# Patient Record
Sex: Female | Born: 1945
Health system: Southern US, Community
[De-identification: ages and names within clinical notes are randomized; demographics above are authoritative.]

## PROBLEM LIST (undated history)

## (undated) DIAGNOSIS — G4700 Insomnia, unspecified: Secondary | ICD-10-CM

## (undated) DIAGNOSIS — K219 Gastro-esophageal reflux disease without esophagitis: Secondary | ICD-10-CM

## (undated) DIAGNOSIS — R490 Dysphonia: Secondary | ICD-10-CM

## (undated) DIAGNOSIS — G8929 Other chronic pain: Secondary | ICD-10-CM

## (undated) DIAGNOSIS — E039 Hypothyroidism, unspecified: Secondary | ICD-10-CM

## (undated) DIAGNOSIS — I10 Essential (primary) hypertension: Secondary | ICD-10-CM

## (undated) DIAGNOSIS — M542 Cervicalgia: Secondary | ICD-10-CM

## (undated) HISTORY — PX: WISDOM TOOTH EXTRACTION: SHX21

## (undated) HISTORY — DX: Gastro-esophageal reflux disease without esophagitis: K21.9

## (undated) HISTORY — PX: OTHER SURGICAL HISTORY: SHX169

## (undated) HISTORY — PX: EYE SURGERY: SHX253

## (undated) HISTORY — PX: VESICOVAGINAL FISTULA CLOSURE W/ TAH: SUR271

## (undated) HISTORY — DX: Hypothyroidism, unspecified: E03.9

## (undated) HISTORY — DX: Cervicalgia: M54.2

## (undated) HISTORY — DX: Insomnia, unspecified: G47.00

## (undated) HISTORY — DX: Dysphonia: R49.0

## (undated) HISTORY — DX: Other chronic pain: G89.29

---

## 2003-05-22 ENCOUNTER — Encounter: Payer: Self-pay | Admitting: Internal Medicine

## 2003-05-22 ENCOUNTER — Ambulatory Visit (HOSPITAL_COMMUNITY): Admission: RE | Admit: 2003-05-22 | Discharge: 2003-05-22 | Payer: Self-pay | Admitting: Internal Medicine

## 2003-11-07 ENCOUNTER — Ambulatory Visit (HOSPITAL_COMMUNITY): Admission: RE | Admit: 2003-11-07 | Discharge: 2003-11-07 | Payer: Self-pay | Admitting: Internal Medicine

## 2005-03-18 ENCOUNTER — Ambulatory Visit: Payer: Self-pay | Admitting: Pulmonary Disease

## 2005-04-24 ENCOUNTER — Ambulatory Visit: Payer: Self-pay | Admitting: Pulmonary Disease

## 2005-05-25 ENCOUNTER — Ambulatory Visit: Payer: Self-pay | Admitting: Pulmonary Disease

## 2005-06-05 ENCOUNTER — Ambulatory Visit: Payer: Self-pay | Admitting: Gastroenterology

## 2005-06-23 ENCOUNTER — Ambulatory Visit: Payer: Self-pay | Admitting: Gastroenterology

## 2005-06-26 ENCOUNTER — Ambulatory Visit: Payer: Self-pay | Admitting: Pulmonary Disease

## 2005-09-08 ENCOUNTER — Ambulatory Visit: Payer: Self-pay | Admitting: Gastroenterology

## 2005-10-02 ENCOUNTER — Ambulatory Visit: Payer: Self-pay | Admitting: Pulmonary Disease

## 2005-10-27 ENCOUNTER — Ambulatory Visit: Payer: Self-pay | Admitting: Gastroenterology

## 2006-01-28 ENCOUNTER — Ambulatory Visit: Payer: Self-pay | Admitting: Gastroenterology

## 2007-02-01 ENCOUNTER — Ambulatory Visit: Payer: Self-pay | Admitting: Family Medicine

## 2007-02-18 ENCOUNTER — Ambulatory Visit: Payer: Self-pay | Admitting: Family Medicine

## 2007-02-18 LAB — CONVERTED CEMR LAB
ALT: 25 units/L (ref 0–40)
Alkaline Phosphatase: 83 units/L (ref 39–117)
Basophils Relative: 1.6 % — ABNORMAL HIGH (ref 0.0–1.0)
Bilirubin, Direct: 0.1 mg/dL (ref 0.0–0.3)
CO2: 30 meq/L (ref 19–32)
Creatinine, Ser: 0.8 mg/dL (ref 0.4–1.2)
Eosinophils Relative: 2.3 % (ref 0.0–5.0)
GFR calc Af Amer: 95 mL/min
Glucose, Bld: 96 mg/dL (ref 70–99)
HCT: 37.5 % (ref 36.0–46.0)
Hemoglobin: 12.8 g/dL (ref 12.0–15.0)
Lymphocytes Relative: 30 % (ref 12.0–46.0)
Monocytes Absolute: 0.5 10*3/uL (ref 0.2–0.7)
Neutrophils Relative %: 59.4 % (ref 43.0–77.0)
Potassium: 4.3 meq/L (ref 3.5–5.1)
RDW: 14.1 % (ref 11.5–14.6)
TSH: 12.61 microintl units/mL — ABNORMAL HIGH (ref 0.35–5.50)
Total Bilirubin: 0.5 mg/dL (ref 0.3–1.2)
Total CHOL/HDL Ratio: 4
Total Protein: 7.2 g/dL (ref 6.0–8.3)
VLDL: 19 mg/dL (ref 0–40)

## 2007-02-25 ENCOUNTER — Ambulatory Visit: Payer: Self-pay | Admitting: Family Medicine

## 2007-05-05 DIAGNOSIS — K219 Gastro-esophageal reflux disease without esophagitis: Secondary | ICD-10-CM

## 2007-05-05 DIAGNOSIS — J309 Allergic rhinitis, unspecified: Secondary | ICD-10-CM | POA: Insufficient documentation

## 2007-05-05 DIAGNOSIS — J45909 Unspecified asthma, uncomplicated: Secondary | ICD-10-CM

## 2007-05-10 ENCOUNTER — Ambulatory Visit: Payer: Self-pay | Admitting: Gastroenterology

## 2007-05-12 ENCOUNTER — Encounter: Payer: Self-pay | Admitting: Family Medicine

## 2007-05-24 ENCOUNTER — Ambulatory Visit: Payer: Self-pay | Admitting: Gastroenterology

## 2007-05-24 ENCOUNTER — Encounter: Payer: Self-pay | Admitting: Family Medicine

## 2007-05-24 HISTORY — PX: COLONOSCOPY: SHX174

## 2007-06-03 ENCOUNTER — Ambulatory Visit: Payer: Self-pay | Admitting: Family Medicine

## 2007-06-03 DIAGNOSIS — E039 Hypothyroidism, unspecified: Secondary | ICD-10-CM | POA: Insufficient documentation

## 2007-06-03 DIAGNOSIS — I10 Essential (primary) hypertension: Secondary | ICD-10-CM

## 2007-06-03 DIAGNOSIS — M542 Cervicalgia: Secondary | ICD-10-CM | POA: Insufficient documentation

## 2007-06-03 DIAGNOSIS — Z9189 Other specified personal risk factors, not elsewhere classified: Secondary | ICD-10-CM

## 2007-06-07 LAB — CONVERTED CEMR LAB: TSH: 2.17 microintl units/mL (ref 0.35–5.50)

## 2007-08-19 ENCOUNTER — Encounter: Payer: Self-pay | Admitting: Pulmonary Disease

## 2007-08-24 ENCOUNTER — Ambulatory Visit: Payer: Self-pay | Admitting: Family Medicine

## 2007-08-29 LAB — CONVERTED CEMR LAB
Chloride: 105 meq/L (ref 96–112)
Creatinine, Ser: 1 mg/dL (ref 0.4–1.2)
Glucose, Bld: 87 mg/dL (ref 70–99)
Potassium: 4 meq/L (ref 3.5–5.1)
Sodium: 142 meq/L (ref 135–145)
TSH: 3.37 microintl units/mL (ref 0.35–5.50)

## 2007-08-31 ENCOUNTER — Ambulatory Visit: Payer: Self-pay | Admitting: Gastroenterology

## 2007-09-07 ENCOUNTER — Telehealth: Payer: Self-pay | Admitting: Family Medicine

## 2007-09-20 ENCOUNTER — Telehealth: Payer: Self-pay | Admitting: Family Medicine

## 2008-01-16 ENCOUNTER — Ambulatory Visit: Payer: Self-pay | Admitting: Family Medicine

## 2008-01-16 DIAGNOSIS — I803 Phlebitis and thrombophlebitis of lower extremities, unspecified: Secondary | ICD-10-CM

## 2008-01-17 ENCOUNTER — Encounter: Payer: Self-pay | Admitting: Family Medicine

## 2008-01-17 ENCOUNTER — Ambulatory Visit: Payer: Self-pay

## 2008-02-17 ENCOUNTER — Telehealth: Payer: Self-pay | Admitting: Family Medicine

## 2009-11-28 ENCOUNTER — Ambulatory Visit: Payer: Self-pay | Admitting: Family Medicine

## 2009-12-09 ENCOUNTER — Telehealth: Payer: Self-pay | Admitting: Family Medicine

## 2009-12-18 ENCOUNTER — Telehealth: Payer: Self-pay | Admitting: Family Medicine

## 2009-12-19 ENCOUNTER — Encounter: Payer: Self-pay | Admitting: Family Medicine

## 2010-01-01 ENCOUNTER — Ambulatory Visit: Payer: Self-pay | Admitting: Family Medicine

## 2010-10-05 LAB — CONVERTED CEMR LAB
Basophils Absolute: 0.1 10*3/uL (ref 0.0–0.1)
Eosinophils Absolute: 0.3 10*3/uL (ref 0.0–0.7)
HCT: 39.9 % (ref 36.0–46.0)
Hemoglobin: 13 g/dL (ref 12.0–15.0)
Hgb A1c MFr Bld: 5.8 % (ref 4.6–6.5)
Lymphs Abs: 2.1 10*3/uL (ref 0.7–4.0)
MCHC: 32.6 g/dL (ref 30.0–36.0)
Monocytes Absolute: 0.6 10*3/uL (ref 0.1–1.0)
Neutro Abs: 5.2 10*3/uL (ref 1.4–7.7)
Platelets: 314 10*3/uL (ref 150.0–400.0)
RDW: 13.2 % (ref 11.5–14.6)

## 2010-10-07 NOTE — Progress Notes (Signed)
Summary: PA call  Phone Note From Pharmacy   Caller: cheryl smith,[facility] Summary of Call: Calling to followup on PA fax.  [phone] option #1.  Ref # [id number].  for Lunesta & Achiphex.  Please call back with response.  Long message with too many specifics to include.     They will refax the info to Dr. Clent Ridges.  Rudy Jew, RN  December 09, 2009 5:10 PM  Initial call taken by: Rudy Jew, RN,  December 09, 2009 1:07 PM

## 2010-10-07 NOTE — Medication Information (Signed)
Summary: Coverage Approval for Lunesta  Coverage Approval for Lunesta   Imported By: Maryln Gottron 12/23/2009 15:32:42  _____________________________________________________________________  External Attachment:    Type:   Image     Comment:   External Document

## 2010-10-07 NOTE — Assessment & Plan Note (Signed)
Summary: renew meds//ccm   Vital Signs:  Patient profile:   65 year old female Weight:      212 pounds Temp:     98.2 degrees F oral BP sitting:   146 / 104  (left arm) Cuff size:   regular  Vitals Entered By: Raechel Ache, RN (November 28, 2009 11:13 AM) CC: ROV- needs refills and BP is up.   History of Present Illness: Here for med refills and to ask if she can try Lunesta for sleep. She had some fasting labs drawn at her job this am, and she brings them with her. She has never had elevated glucoses before, but she does have a strong family hx of diabetes. Her fasting glucose was 136. her TSH should be available tomorrow.   Allergies: 1)  ! Morphine Sulfate Cr (Morphine Sulfate) 2)  ! Pneumovax 23 (Pneumococcal Vac Polyvalent)  Past History:  Past Medical History: Reviewed history from 08/19/2007 and no changes required. NECK PAIN, CHRONIC (ICD-723.1) HYPOTHYROIDISM (ICD-244.9) HYPERTENSION (ICD-401.9) INSOMNIA, HX OF (ICD-V15.89) GERD (ICD-530.81) ASTHMA (ICD-493.90) ALLERGIC RHINITIS (ICD-477.9)  Past Surgical History: Reviewed history from 06/03/2007 and no changes required. Caesarean section x2 Hysterectomy lt foot surgery Eye surgery Wisdom teeth removed  Review of Systems  The patient denies anorexia, fever, weight loss, weight gain, vision loss, decreased hearing, hoarseness, chest pain, syncope, dyspnea on exertion, peripheral edema, prolonged cough, headaches, hemoptysis, abdominal pain, melena, hematochezia, severe indigestion/heartburn, hematuria, incontinence, genital sores, muscle weakness, suspicious skin lesions, transient blindness, difficulty walking, depression, unusual weight change, abnormal bleeding, enlarged lymph nodes, angioedema, breast masses, and testicular masses.    Physical Exam  General:  Well-developed,well-nourished,in no acute distress; alert,appropriate and cooperative throughout examination Neck:  No deformities, masses, or  tenderness noted. Lungs:  Normal respiratory effort, chest expands symmetrically. Lungs are clear to auscultation, no crackles or wheezes. Heart:  Normal rate and regular rhythm. S1 and S2 normal without gallop, murmur, click, rub or other extra sounds.   Impression & Recommendations:  Problem # 1:  DIABETES MELLITUS, TYPE II (ICD-250.00)  Orders: Venipuncture (16109) TLB-CBC Platelet - w/Differential (85025-CBCD) TLB-A1C / Hgb A1C (Glycohemoglobin) (83036-A1C) TLB-Microalbumin/Creat Ratio, Urine (82043-MALB)  Her updated medication list for this problem includes:    Lisinopril 10 Mg Tabs (Lisinopril) ..... Once daily  Problem # 2:  HYPOTHYROIDISM (ICD-244.9)  Her updated medication list for this problem includes:    Synthroid 100 Mcg Tabs (Levothyroxine sodium) .Marland Kitchen... 1 by mouth once daily  Problem # 3:  HYPERTENSION (ICD-401.9)  Her updated medication list for this problem includes:    Lisinopril 10 Mg Tabs (Lisinopril) ..... Once daily  Problem # 4:  INSOMNIA, HX OF (ICD-V15.89)  Complete Medication List: 1)  Aciphex 20 Mg Tbec (Rabeprazole sodium) .... Take 1 tablet by mouth two times a day 2)  Nasacort Aq 55 Mcg/act Aers (Triamcinolone acetonide(nasal)) .... 2 sprays each nostril once daily 3)  Albuterol 90 Mcg/act Aers (Albuterol) .... As needed 4)  Multivitamins Caps (Multiple vitamin) .Marland Kitchen.. 1 by mouth once daily 5)  Restasis 0.05 % Emul (Cyclosporine) .... As needed 6)  Synthroid 100 Mcg Tabs (Levothyroxine sodium) .Marland Kitchen.. 1 by mouth once daily 7)  Lunesta 3 Mg Tabs (Eszopiclone) .... At bedtime 8)  Lisinopril 10 Mg Tabs (Lisinopril) .... Once daily  Patient Instructions: 1)  She certainly now has evidence of type II diabetes, and we spent 35 minutes talking about this. We will check an A1c today to see where she stands. She needs to  adjust her diet and get more exercise. Start on Lisinopril for her HTN, and this adds renal protection as well. Try Lunesta.  2)  Please  schedule a follow-up appointment in 1 month, and we will do a cpx at that time. Prescriptions: ACIPHEX 20 MG  TBEC (RABEPRAZOLE SODIUM) Take 1 tablet by mouth two times a day  #180 x 3   Entered and Authorized by:   Nelwyn Salisbury MD   Signed by:   Nelwyn Salisbury MD on 11/28/2009   Method used:   Print then Give to Patient   RxID:   [id number] SYNTHROID 100 MCG TABS (LEVOTHYROXINE SODIUM) 1 by mouth once daily  #90 x 3   Entered and Authorized by:   Nelwyn Salisbury MD   Signed by:   Nelwyn Salisbury MD on 11/28/2009   Method used:   Print then Give to Patient   RxID:   [id number] LISINOPRIL 10 MG TABS (LISINOPRIL) once daily  #90 x 3   Entered and Authorized by:   Nelwyn Salisbury MD   Signed by:   Nelwyn Salisbury MD on 11/28/2009   Method used:   Print then Give to Patient   RxID:   [id number] LUNESTA 3 MG TABS (ESZOPICLONE) at bedtime  #90 x 1   Entered and Authorized by:   Nelwyn Salisbury MD   Signed by:   Nelwyn Salisbury MD on 11/28/2009   Method used:   Print then Give to Patient   RxID:   [id number]

## 2010-10-07 NOTE — Medication Information (Signed)
Summary: Prior Authorization Request for Lunesta  Prior Authorization Request for Lunesta   Imported By: Maryln Gottron 12/26/2009 14:24:59  _____________________________________________________________________  External Attachment:    Type:   Image     Comment:   External Document

## 2010-10-07 NOTE — Assessment & Plan Note (Signed)
Summary: cpx---will fast ok to work in per dr fry//ccm   Vital Signs:  Patient profile:   65 year old female Height:      65 inches Weight:      214 pounds BMI:     35.74 BP sitting:   122 / 90  (left arm) Cuff size:   large  Vitals Entered By: Raechel Ache, RN (January 01, 2010 8:52 AM) CC: CPX, labs done.   History of Present Illness: 65 yr old female for a cpx. She feels good in general but she admits to using her rescue inhaler every day. She had used Advair in the past, but not recently. Spring is usually the worst time of year for her.   Preventive Screening-Counseling & Management  Alcohol-Tobacco     Smoking Status: quit  Allergies: 1)  ! Morphine Sulfate Cr (Morphine Sulfate) 2)  ! Pneumovax 23 (Pneumococcal Vac Polyvalent)  Past History:  Past Medical History: Reviewed history from 08/19/2007 and no changes required. NECK PAIN, CHRONIC (ICD-723.1) HYPOTHYROIDISM (ICD-244.9) HYPERTENSION (ICD-401.9) INSOMNIA, HX OF (ICD-V15.89) GERD (ICD-530.81) ASTHMA (ICD-493.90) ALLERGIC RHINITIS (ICD-477.9)  Past Surgical History: Caesarean section x2 Hysterectomy lt foot surgery Eye surgery Wisdom teeth removed colonoscopy 05-24-07 per Dr. Russella Dar, benign polyps and diverticulosis, repeat 5 yrs  Family History: Reviewed history and no changes required. Family History of CAD Female 1st degree relative <50 Family History Diabetes 1st degree relative Family History High cholesterol Family History Hypertension Family History of Stroke M 1st degree relative <50  Social History: Reviewed history and no changes required. Divorced Former Smoker Alcohol use-yes Smoking Status:  quit  Review of Systems  The patient denies anorexia, fever, weight loss, vision loss, decreased hearing, hoarseness, chest pain, syncope, peripheral edema, prolonged cough, headaches, hemoptysis, abdominal pain, melena, hematochezia, severe indigestion/heartburn, hematuria, incontinence,  genital sores, muscle weakness, suspicious skin lesions, transient blindness, difficulty walking, depression, unusual weight change, abnormal bleeding, enlarged lymph nodes, angioedema, breast masses, and testicular masses.    Physical Exam  General:  overweight-appearing.   Head:  Normocephalic and atraumatic without obvious abnormalities. No apparent alopecia or balding. Eyes:  No corneal or conjunctival inflammation noted. EOMI. Perrla. Funduscopic exam benign, without hemorrhages, exudates or papilledema. Vision grossly normal. Ears:  External ear exam shows no significant lesions or deformities.  Otoscopic examination reveals clear canals, tympanic membranes are intact bilaterally without bulging, retraction, inflammation or discharge. Hearing is grossly normal bilaterally. Nose:  External nasal examination shows no deformity or inflammation. Nasal mucosa are pink and moist without lesions or exudates. Mouth:  Oral mucosa and oropharynx without lesions or exudates.  Teeth in good repair. Neck:  No deformities, masses, or tenderness noted. Chest Wall:  No deformities, masses, or tenderness noted. Breasts:  No mass, nodules, thickening, tenderness, bulging, retraction, inflamation, nipple discharge or skin changes noted.   Lungs:  Normal respiratory effort, chest expands symmetrically. Lungs are clear to auscultation, no crackles or wheezes. Heart:  Normal rate and regular rhythm. S1 and S2 normal without gallop, murmur, click, rub or other extra sounds. EKG normal Abdomen:  Bowel sounds positive,abdomen soft and non-tender without masses, organomegaly or hernias noted. Msk:  No deformity or scoliosis noted of thoracic or lumbar spine.   Pulses:  R and L carotid,radial,femoral,dorsalis pedis and posterior tibial pulses are full and equal bilaterally Extremities:  No clubbing, cyanosis, edema, or deformity noted with normal full range of motion of all joints.   Neurologic:  No cranial nerve  deficits noted. Station and  gait are normal. Plantar reflexes are down-going bilaterally. DTRs are symmetrical throughout. Sensory, motor and coordinative functions appear intact. Skin:  Intact without suspicious lesions or rashes Cervical Nodes:  No lymphadenopathy noted Axillary Nodes:  No palpable lymphadenopathy Inguinal Nodes:  No significant adenopathy Psych:  Cognition and judgment appear intact. Alert and cooperative with normal attention span and concentration. No apparent delusions, illusions, hallucinations   Impression & Recommendations:  Problem # 1:  HEALTH MAINTENANCE EXAM (ICD-V70.0)  Orders: EKG w/ Interpretation (93000)  Complete Medication List: 1)  Nasacort Aq 55 Mcg/act Aers (Triamcinolone acetonide(nasal)) .... 2 sprays each nostril once daily 2)  Multivitamins Caps (Multiple vitamin) .Marland Kitchen.. 1 by mouth once daily 3)  Restasis 0.05 % Emul (Cyclosporine) .... As needed 4)  Synthroid 100 Mcg Tabs (Levothyroxine sodium) .Marland Kitchen.. 1 by mouth once daily 5)  Lunesta 3 Mg Tabs (Eszopiclone) .... At bedtime 6)  Lisinopril 10 Mg Tabs (Lisinopril) .... Once daily 7)  Prilosec 40 Mg Cpdr (Omeprazole) .Marland Kitchen.. 1 two times a day 8)  Proair Hfa 108 (90 Base) Mcg/act Aers (Albuterol sulfate) .... 2 puffs q 4 hours as needed 9)  Qvar 80 Mcg/act Aers (Beclomethasone dipropionate) .... 2 puffs two times a day  Patient Instructions: 1)  She needs a maintenance inhaler, so we will start her on QVar daily.  2)  It is important that you exercise reguarly at least 20 minutes 5 times a week. If you develop chest pain, have severe difficulty breathing, or feel very tired, stop exercising immediately and seek medical attention.  3)  You need to lose weight. Consider a lower calorie diet and regular exercise.  4)  Schedule your mammogram.  Prescriptions: QVAR 80 MCG/ACT AERS (BECLOMETHASONE DIPROPIONATE) 2 puffs two times a day  #1 x 0   Entered and Authorized by:   Nelwyn Salisbury MD   Signed by:    Nelwyn Salisbury MD on 01/01/2010   Method used:   Electronically to        [facility] [address] [phone]* (retail)       [address] [place], [place]  [address]       Ph: [phone]       Fax: [phone]   RxID:   [id number] QVAR 80 MCG/ACT AERS (BECLOMETHASONE DIPROPIONATE) 2 puffs two times a day  #90 x 3   Entered and Authorized by:   Nelwyn Salisbury MD   Signed by:   Nelwyn Salisbury MD on 01/01/2010   Method used:   Print then Give to Patient   RxID:   [id number] PROAIR HFA 108 (90 BASE) MCG/ACT AERS (ALBUTEROL SULFATE) 2 puffs q 4 hours as needed  #6 x 3   Entered and Authorized by:   Nelwyn Salisbury MD   Signed by:   Nelwyn Salisbury MD on 01/01/2010   Method used:   Print then Give to Patient   RxID:   [id number]    Preventive Care Screening  Colonoscopy:    Date:  09/07/2006    Results:  normal

## 2010-10-07 NOTE — Progress Notes (Signed)
Summary: Lunesta authorization process  Phone Note Call from Patient Call back at [facility] Phone [phone]   Caller: Patient Summary of Call: Pt called re: status of PA for Lunesta. Pt can not take Ambien because of migraines. Please check with [facility]. Pt has been trying to get this taken care of since 12/02/09.   Called patient with info from [facility]'s 4-4 call to office.  She will call [facility] & check.  Rudy Jew, RN  December 18, 2009 12:51 PM    Initial call taken by: Lucy Antigua,  December 18, 2009 12:37 PM  Follow-up for Phone Call        Patient did call [facility].  The Aciphex has been authorized, but not the [place].  [facility] says we need to call to authorize.  Done.  Case # [id number] for drug coverage Lunesta.  Case # [id number] for Lunesta quantity.  Will get 2 faxes & Dr. needs to fill out both.  If approval received, will get fax notifying into office. Office/doctor  will then need to call back with prescription.   Follow-up by: Rudy Jew, RN,  December 19, 2009 9:36 AM  Additional Follow-up for Phone Call Additional follow up Details #1::        agreed Additional Follow-up by: Nelwyn Salisbury MD,  December 19, 2009 11:51 AM

## 2010-10-13 ENCOUNTER — Telehealth: Payer: Self-pay

## 2010-10-13 DIAGNOSIS — K219 Gastro-esophageal reflux disease without esophagitis: Secondary | ICD-10-CM

## 2010-10-13 DIAGNOSIS — E039 Hypothyroidism, unspecified: Secondary | ICD-10-CM

## 2010-10-13 MED ORDER — ESOMEPRAZOLE MAGNESIUM 40 MG PO CPDR: 40.0000 mg | DELAYED_RELEASE_CAPSULE | Freq: Every day | ORAL | Status: DC

## 2010-10-13 MED ORDER — LEVOTHYROXINE SODIUM 100 MCG PO TABS: 100.0000 ug | ORAL_TABLET | Freq: Every day | ORAL | Status: DC

## 2010-10-13 NOTE — Telephone Encounter (Signed)
Pt aware.

## 2010-10-24 ENCOUNTER — Other Ambulatory Visit: Payer: Self-pay | Admitting: Family Medicine

## 2010-12-26 ENCOUNTER — Encounter: Payer: Self-pay | Admitting: Family Medicine

## 2010-12-29 ENCOUNTER — Other Ambulatory Visit (INDEPENDENT_AMBULATORY_CARE_PROVIDER_SITE_OTHER): Payer: BC Managed Care – PPO | Admitting: Family Medicine

## 2010-12-29 DIAGNOSIS — E785 Hyperlipidemia, unspecified: Secondary | ICD-10-CM

## 2010-12-29 DIAGNOSIS — Z0000 Encounter for general adult medical examination without abnormal findings: Secondary | ICD-10-CM

## 2010-12-29 LAB — POCT URINALYSIS DIPSTICK
Spec Grav, UA: 1.025
pH, UA: 5

## 2010-12-29 LAB — CBC WITH DIFFERENTIAL/PLATELET
Basophils Absolute: 0 10*3/uL (ref 0.0–0.1)
Basophils Relative: 0.4 % (ref 0.0–3.0)
Eosinophils Absolute: 0.2 10*3/uL (ref 0.0–0.7)
MCHC: 33.4 g/dL (ref 30.0–36.0)
MCV: 84.3 fl (ref 78.0–100.0)
Monocytes Absolute: 0.7 10*3/uL (ref 0.1–1.0)
Neutrophils Relative %: 69.2 % (ref 43.0–77.0)
Platelets: 361 10*3/uL (ref 150.0–400.0)
RBC: 4.82 Mil/uL (ref 3.87–5.11)
RDW: 15.8 % — ABNORMAL HIGH (ref 11.5–14.6)

## 2010-12-29 LAB — LIPID PANEL
HDL: 67 mg/dL (ref >39.00)
Triglycerides: 141 mg/dL (ref 0.0–149.0)
VLDL: 28.2 mg/dL (ref 0.0–40.0)

## 2010-12-29 LAB — BASIC METABOLIC PANEL
BUN: 15 mg/dL (ref 6–23)
CO2: 26 mEq/L (ref 19–32)
Calcium: 9.3 mg/dL (ref 8.4–10.5)
Chloride: 101 mEq/L (ref 96–112)
Creatinine, Ser: 1 mg/dL (ref 0.4–1.2)
Glucose, Bld: 93 mg/dL (ref 70–99)

## 2010-12-29 LAB — HEPATIC FUNCTION PANEL
Albumin: 4 g/dL (ref 3.5–5.2)
Bilirubin, Direct: 0 mg/dL (ref 0.0–0.3)
Total Protein: 7.1 g/dL (ref 6.0–8.3)

## 2010-12-29 LAB — TSH: TSH: 1.38 u[IU]/mL (ref 0.35–5.50)

## 2010-12-30 NOTE — Progress Notes (Signed)
Pt informed

## 2011-01-05 ENCOUNTER — Encounter: Payer: Self-pay | Admitting: Family Medicine

## 2011-01-05 ENCOUNTER — Ambulatory Visit (INDEPENDENT_AMBULATORY_CARE_PROVIDER_SITE_OTHER): Payer: BC Managed Care – PPO | Admitting: Family Medicine

## 2011-01-05 VITALS — BP 124/88 | HR 109 | Temp 98.8°F | Ht 64.0 in | Wt 206.0 lb

## 2011-01-05 DIAGNOSIS — I10 Essential (primary) hypertension: Secondary | ICD-10-CM

## 2011-01-05 DIAGNOSIS — K219 Gastro-esophageal reflux disease without esophagitis: Secondary | ICD-10-CM

## 2011-01-05 DIAGNOSIS — Z0000 Encounter for general adult medical examination without abnormal findings: Secondary | ICD-10-CM

## 2011-01-05 MED ORDER — TRIAMCINOLONE ACETONIDE(NASAL) 55 MCG/ACT NA INHA: 2.0000 | Freq: Every day | NASAL | Status: DC

## 2011-01-05 MED ORDER — TEMAZEPAM 30 MG PO CAPS: 30.0000 mg | ORAL_CAPSULE | Freq: Every evening | ORAL | Status: DC | PRN

## 2011-01-05 MED ORDER — ALBUTEROL SULFATE HFA 108 (90 BASE) MCG/ACT IN AERS: 2.0000 | INHALATION_SPRAY | RESPIRATORY_TRACT | Status: DC | PRN

## 2011-01-05 MED ORDER — BECLOMETHASONE DIPROPIONATE 80 MCG/ACT IN AERS: 2.0000 | INHALATION_SPRAY | Freq: Two times a day (BID) | RESPIRATORY_TRACT | Status: DC

## 2011-01-05 MED ORDER — LISINOPRIL 10 MG PO TABS: 10.0000 mg | ORAL_TABLET | Freq: Every day | ORAL | Status: DC

## 2011-01-05 MED ORDER — ESOMEPRAZOLE MAGNESIUM 40 MG PO CPDR: 40.0000 mg | DELAYED_RELEASE_CAPSULE | Freq: Two times a day (BID) | ORAL | Status: DC

## 2011-01-05 MED ORDER — LEVOTHYROXINE SODIUM 100 MCG PO TABS: 100.0000 ug | ORAL_TABLET | Freq: Every day | ORAL | Status: DC

## 2011-01-05 MED ORDER — AZELASTINE HCL 0.15 % NA SOLN: 2.0000 | Freq: Two times a day (BID) | NASAL | Status: DC

## 2011-01-05 NOTE — Progress Notes (Signed)
  Subjective:    Patient ID: Whitney Evans, female    DOB: [birth year], 65 y.o.   MRN: [id number]  HPI 65 yr old female for a cpx. She feels well in general. She has been seeing an ENT in [place] for chronic hoarseness, and a recent endoscopy revealed a lot of upper airway inflammation, presumably from reflux. She is on bid PPI therapy now.    Review of Systems  Constitutional: Negative.   HENT: Negative.   Eyes: Negative.   Respiratory: Negative.   Cardiovascular: Negative.   Gastrointestinal: Negative.   Genitourinary: Negative for dysuria, urgency, frequency, hematuria, flank pain, decreased urine volume, enuresis, difficulty urinating, pelvic pain and dyspareunia.  Musculoskeletal: Negative.   Skin: Negative.   Neurological: Negative.   Hematological: Negative.   Psychiatric/Behavioral: Negative.        Objective:   Physical Exam  Constitutional: She is oriented to person, place, and time. She appears well-developed and well-nourished. No distress.  HENT:  Head: Normocephalic and atraumatic.  Right Ear: External ear normal.  Left Ear: External ear normal.  Nose: Nose normal.  Mouth/Throat: Oropharynx is clear and moist. No oropharyngeal exudate.  Eyes: Conjunctivae and EOM are normal. Pupils are equal, round, and reactive to light. No scleral icterus.  Neck: Normal range of motion. Neck supple. No JVD present. No thyromegaly present.  Cardiovascular: Normal rate, regular rhythm, normal heart sounds and intact distal pulses.  Exam reveals no gallop and no friction rub.   No murmur heard.      EKG normal   Pulmonary/Chest: Effort normal and breath sounds normal. No respiratory distress. She has no wheezes. She has no rales. She exhibits no tenderness.  Abdominal: Soft. Bowel sounds are normal. She exhibits no distension and no mass. There is no tenderness. There is no rebound and no guarding.  Musculoskeletal: Normal range of motion. She exhibits no edema and no  tenderness.  Lymphadenopathy:    She has no cervical adenopathy.  Neurological: She is alert and oriented to person, place, and time. She has normal reflexes. No cranial nerve deficit. She exhibits normal muscle tone. Coordination normal.  Skin: Skin is warm and dry. No rash noted. No erythema.  Psychiatric: She has a normal mood and affect. Her behavior is normal. Judgment and thought content normal.          Assessment & Plan:  Continue current measures. Get more exercise.

## 2011-01-20 NOTE — Assessment & Plan Note (Signed)
[facility] OFFICE NOTE   Whitney Evans, Whitney Evans                    MRN:          [id number]  DATE:02/01/2007                            DOB:          1945/11/13    This is a 65 year old woman here to establish with our practice.  She  does have a couple of ongoing concerns.  She has not had a primary care  physician in quite a while but does sees some specialists.  She has been  seeing Dr. Synthia Innocent for pulmonary care.  She sees Dr. Karolee Ohs  for gastroenterology care and she sees a Dr. Delford Field who is an ear, nose  and throat surgeon at [facility].   PAST MEDICAL HISTORY:  She has had 2 C-sections and then had a TAH and  BSO in 1973, unfortunately she has not had a pelvic exam since 1973.  She has not had a mammogram since 2002, although these have all been  normal.  She has never had a colonoscopy.  She had surgery to her left  foot.  She developed a problem with chronic hoarseness and easy voice  fatigability  in 2007, then Dr. Delford Field at [facility] did laser  surgery to remove scar tissue from her vocal cords, which was quite  successful.  She now has started to get slightly hoarse again.  Dr.  Delford Field told her that she may need to have this procedure done again  several times.  She has a history of asthma.  She was on Qvar for a  while per Dr. Sung Amabile but then stopped it.  She now uses only a rescue  inhaler.  She needs this only once or twice a week fortunately.  She has  acid reflux.   REVIEW OF SYSTEMS:  She has seasonal allergies.  She saw an Urgent Care  Center just last week with complaints of stuffy head, itchy eyes,  postnasal drainage, etc.  She was put on  Xyzal and Nasacort AQ sprays which were quite successful.  She also has  a history of insomnia.  She has tried Ambien in the past but it did not  work well.  She is currently using Benadryl over the counter.  She has  had good  look with Restoril in the past and asks me for a new  prescription for that.   ALLERGIES:  MORPHINE and PNEUMOVAX.   CURRENT MEDICATIONS:  1. Xyzal 5 mg daily.  2. AcipHex 20 mg b.i.d.  3. Apo-domperidone 10 mg  q.i.d. (This is a medication obtained from      [place], which is similar to Reglan per Dr. Russella Dar).  4. Glucosamine daily.  5. Multivitamins daily.  6. Herbal Life Garden 2 supplements daily.  7. Over-the-counter potassium supplements daily.  8. Nasacort AQ once or twice daily.  9. Albuterol inhaler as needed.   HABITS:  She drinks some alcohol.  She does not use tobacco.   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   FAMILY HISTORY:  Remarkable for heart disease and diabetes.   OBJECTIVE:  Height 5 foot 4 inches, weight 214, BP 142/94, pulse 72 and  regular.  GENERAL:  She is quite overweight.  She is breathing comfortably.  Eyes clear, ears clear, pharynx clear.  NECK:  Supple without lymphadenopathy or masses.  LUNGS:  Are clear with good air flow.  CARDIAC:  Regular rate and rhythm regular without gallop, murmurs, rubs.  Distal pulses are full.  EXTREMITIES:  Show no edema.   ASSESSMENT AND PLAN:  1. Recent flare-up of allergies.  She seems to be doing well.  Will      follow up as needed.  2. Asthma, apparently stable.  3. Insomnia.  I wrote for Restoril 30 mg to take at night as needed      for sleep, #90 plus 3 refills.  4. Gastroesophageal reflux disease - stable.  5. Health maintenance.  Will have her return soon for fasting      laboratories and a complete physical exam.     Tera Mater. Clent Ridges, MD  Electronically Signed    SAF/MedQ  DD: 02/02/2007  DT: 02/02/2007  Job #: [id number]

## 2011-01-20 NOTE — Assessment & Plan Note (Signed)
[facility] OFFICE NOTE   EMMETT, ARNTZ                    MRN:          [id number]  DATE:02/25/2007                            DOB:          Dec 30, 1945    This is a 65 year old woman here for a complete physical examination. In  general, she is doing well, but her only complaint is frustration with  the inability to lose weight. She continues to gain weight even though  she is eating what sounds like a very healthy diet, and is also working  out at least 6 or 7 days a week. Three days a week she works out hard  with a Systems analyst, and the other days she walks on her treadmill  at home. When I last saw her a few weeks ago, we talked about trouble  sleeping. I prescribed Temazepam and she finds this very effective. For  other details of her past medical history, family history, social  history, habits, etc., refer to her introductory note for her dated Feb 01, 2007.   ALLERGIES:  MORPHINE and PNEUMONIA VACCINE.   CURRENT MEDICATIONS:  1. Xyzal 5 mg daily.  2. Aciphex 20 mg b.i.d.  3. Apo Domperidone 10 mg daily.  4. Glucosamine supplements daily.  5. Multivitamins daily.  6. Over-the-counter potassium daily.  7. Nasacort sprays b.i.d.  8. Albuterol inhaler as needed.  9. Temazepam 30 mg nightly as needed.   OBJECTIVE:  VITAL SIGNS:  Height 5 foot 2 inches, weight 218, blood  pressure 142/88, pulse 72 and regular.  GENERAL:  She is quite obese.  SKIN:  Clear.  EYES:  Clear.  EARS:  Clear.  OROPHARYNX:  Clear.  NECK:  Supple without lymphadenopathy or masses.  LUNGS:  Clear.  CARDIAC:  Regular rate and rhythm with no murmurs, rubs, or gallops.  Pulses are full. EKG is within normal limits.  BREASTS AND AXILLA:  Clear.  ABDOMEN:  Soft, normal bowel sounds, nontender, no masses.  EXTERNAL GENITALIA:  Within normal limits.  RECTAL:  No mass or tenderness. Stool is hemoccult negative.  EXTREMITIES:  No cyanosis, clubbing, or edema.  NEUROLOGIC:  Grossly intact.   She was here for fasting labs on June 13 and these were remarkable for  her lipid panel and her TSH. HDL was excellent at 57, LDL was a bit high  at 139, and her TSH is quite elevated at 12.61.   ASSESSMENT AND PLAN:  1. Complete physical. I encouraged her to continue with her diet and      exercise regimen. I reminded her that she is past due for a      mammogram and she said that she will call and set this up soon. We      will also set her up for her first screening colonoscopy.  2. GE reflux disease, stable.  3. Insomnia, stable.  4. Asthma, stable.  5. Elevated blood pressure. I think that diet and exercise will take      care of this. We will continue to watch it closely.  6. Hyperlipidemia. I think diet  and weight loss will address this as      well. We will continue to monitor it.  7. Newly diagnosed hypothyroidism. Synthroid 100 mcg per day and we      will recheck levels in three months.     Tera Mater. Clent Ridges, MD  Electronically Signed    SAF/MedQ  DD: 02/25/2007  DT: 02/26/2007  Job #: [id number]

## 2011-01-20 NOTE — Assessment & Plan Note (Signed)
[facility]                         GASTROENTEROLOGY OFFICE NOTE   JOZELYNN, DANIELSON                    MRN:          [id number]  DATE:08/31/2007                            DOB:          08-22-1946    This is a return office visit for GERD with presumed laryngopharyngeal  reflux.  Her symptoms remain under good control on Aciphex b.i.d. and  domperidone a.c. and q.h.s.  When she discontinues Aciphex or  domperidone, her symptoms flare.   CURRENT MEDICATIONS:  Listed on the chart, updated and reviewed.   MEDICATION ALLERGIES:  MORPHINE, leading to hives, nausea and vomiting,  and PNEUMONIA VACCINE, leading to swelling at the injection site.   PHYSICAL EXAM:  No acute distress.  Weight 198 pounds.  Blood pressure 150/80, pulse 80 and regular.  CHEST:  Clear to auscultation bilaterally.  CARDIAC:  Regular rate and rhythm without murmurs.  ABDOMEN:  Soft and nontender with normoactive bowel sounds.  No palpable  organomegaly, mass or hernias.   ASSESSMENT AND PLAN:  Gastroesophageal reflux disease with presumed  laryngopharyngeal reflux.  She may have other underlying vocal cord  disorders.  Maintain Aciphex 20 mg p.o. b.i.d. and domperidone 10 mg  a.c. and q.h.s.  Refills for 6 months prescribed.  Return office visit  in 6 months.     Venita Lick. Russella Dar, MD, [facility]  Electronically Signed    MTS/MedQ  DD: 09/08/2007  DT: 09/08/2007  Job #: [id number]   cc:   Suzanna Obey, M.D.

## 2011-08-25 ENCOUNTER — Telehealth: Payer: Self-pay | Admitting: Family Medicine

## 2011-08-25 NOTE — Telephone Encounter (Signed)
Refill Temazepam to [facility]. Thanks.

## 2011-08-25 NOTE — Telephone Encounter (Signed)
Call in #90 with one rf 

## 2011-08-27 MED ORDER — TEMAZEPAM 30 MG PO CAPS: 30.0000 mg | ORAL_CAPSULE | Freq: Every evening | ORAL | Status: DC | PRN

## 2011-08-27 NOTE — Telephone Encounter (Signed)
Script faxed to [facility].

## 2011-12-03 ENCOUNTER — Other Ambulatory Visit (INDEPENDENT_AMBULATORY_CARE_PROVIDER_SITE_OTHER): Payer: BC Managed Care – PPO

## 2011-12-03 DIAGNOSIS — Z0000 Encounter for general adult medical examination without abnormal findings: Secondary | ICD-10-CM

## 2011-12-03 LAB — CBC WITH DIFFERENTIAL/PLATELET
Basophils Absolute: 0.1 10*3/uL (ref 0.0–0.1)
Eosinophils Absolute: 0.3 10*3/uL (ref 0.0–0.7)
HCT: 41.9 % (ref 36.0–46.0)
Hemoglobin: 14 g/dL (ref 12.0–15.0)
Lymphs Abs: 2.2 10*3/uL (ref 0.7–4.0)
MCHC: 33.5 g/dL (ref 30.0–36.0)
MCV: 85.1 fl (ref 78.0–100.0)
Neutro Abs: 4.1 10*3/uL (ref 1.4–7.7)
RDW: 15.5 % — ABNORMAL HIGH (ref 11.5–14.6)

## 2011-12-03 LAB — POCT URINALYSIS DIPSTICK: Spec Grav, UA: >=1.03

## 2011-12-03 LAB — BASIC METABOLIC PANEL
CO2: 24 mEq/L (ref 19–32)
Glucose, Bld: 94 mg/dL (ref 70–99)
Potassium: 4.4 mEq/L (ref 3.5–5.1)
Sodium: 140 mEq/L (ref 135–145)

## 2011-12-03 LAB — HEPATIC FUNCTION PANEL
AST: 21 U/L (ref 0–37)
Albumin: 4.1 g/dL (ref 3.5–5.2)

## 2011-12-03 LAB — LIPID PANEL
HDL: 63 mg/dL (ref >39.00)
Triglycerides: 129 mg/dL (ref 0.0–149.0)

## 2011-12-03 LAB — TSH: TSH: 0.18 u[IU]/mL — ABNORMAL LOW (ref 0.35–5.50)

## 2011-12-09 LAB — NICOTINE SCREEN, URINE: Nicotine, urine: <2 ng/mL

## 2011-12-11 ENCOUNTER — Telehealth: Payer: Self-pay | Admitting: Family Medicine

## 2011-12-11 NOTE — Telephone Encounter (Signed)
Patient returned the call and stated she did not need refill until she sees Dr Clent Ridges

## 2011-12-11 NOTE — Telephone Encounter (Signed)
Refill request from [facility] for Synthroid 100 mcg & Lisinopril 10 mg. Left voice message for pt to return my call. She has a office visit here for May 2013. We need to know if she needs these medications now before her appointment?

## 2011-12-16 ENCOUNTER — Other Ambulatory Visit: Payer: Self-pay | Admitting: *Deleted

## 2011-12-16 MED ORDER — ATORVASTATIN CALCIUM 20 MG PO TABS: 20.0000 mg | ORAL_TABLET | Freq: Every day | ORAL | Status: DC

## 2011-12-16 NOTE — Telephone Encounter (Signed)
Notes Recorded by Nelwyn Salisbury, MD on 12/14/2011 at 5:49 AM Normal except her chol has gotten quite high. Start on Lipitor 20 mg a day, call in one year supply. Recheck labs in 90 days. The nicotine screen was negative Lipitor sent according to note.    Pt also request refill of Restoril sent to local pharmacy instead of mail order. Last filled by MD on 08/27/11, 90 x 1 Last seen on 01/05/11 Follow up on 01/12/12. Please advise refills. Thanks

## 2011-12-17 NOTE — Telephone Encounter (Signed)
Call in #90 with no rf. She needs an OV  

## 2011-12-18 MED ORDER — TEMAZEPAM 30 MG PO CAPS: 30.0000 mg | ORAL_CAPSULE | Freq: Every evening | ORAL | Status: DC | PRN

## 2012-01-12 ENCOUNTER — Ambulatory Visit (INDEPENDENT_AMBULATORY_CARE_PROVIDER_SITE_OTHER): Payer: BC Managed Care – PPO | Admitting: Family Medicine

## 2012-01-12 ENCOUNTER — Encounter: Payer: Self-pay | Admitting: Family Medicine

## 2012-01-12 VITALS — BP 134/78 | HR 115 | Temp 98.5°F | Ht 63.25 in | Wt 174.0 lb

## 2012-01-12 DIAGNOSIS — K219 Gastro-esophageal reflux disease without esophagitis: Secondary | ICD-10-CM

## 2012-01-12 DIAGNOSIS — Z0000 Encounter for general adult medical examination without abnormal findings: Secondary | ICD-10-CM

## 2012-01-12 DIAGNOSIS — E039 Hypothyroidism, unspecified: Secondary | ICD-10-CM

## 2012-01-12 LAB — T3, FREE: T3, Free: 3 pg/mL (ref 2.3–4.2)

## 2012-01-12 LAB — T4, FREE: Free T4: 1.1 ng/dL (ref 0.60–1.60)

## 2012-01-12 MED ORDER — TEMAZEPAM 30 MG PO CAPS: 30.0000 mg | ORAL_CAPSULE | Freq: Every evening | ORAL | Status: DC | PRN

## 2012-01-12 NOTE — Progress Notes (Signed)
  Subjective:    Patient ID: Whitney Evans, female    DOB: 1946-02-09, 65 y.o.   MRN: [id number]  HPI 66 yr old female for a cpx. She feels well in general except for some mild chronic fatigue. She sleeps well but admits to getting little exercise. Her recent TSH was suppressed a bit, and this may be because her pharmacy has given her several different generic brands of Synthroid in the past year.   Review of Systems  Constitutional: Negative.   HENT: Negative.   Eyes: Negative.   Respiratory: Negative.   Cardiovascular: Negative.   Gastrointestinal: Negative.   Genitourinary: Negative for dysuria, urgency, frequency, hematuria, flank pain, decreased urine volume, enuresis, difficulty urinating, pelvic pain and dyspareunia.  Musculoskeletal: Negative.   Skin: Negative.   Neurological: Negative.   Hematological: Negative.   Psychiatric/Behavioral: Negative.        Objective:   Physical Exam  Constitutional: She is oriented to person, place, and time. She appears well-developed and well-nourished. No distress.  HENT:  Head: Normocephalic and atraumatic.  Right Ear: External ear normal.  Left Ear: External ear normal.  Nose: Nose normal.  Mouth/Throat: Oropharynx is clear and moist. No oropharyngeal exudate.  Eyes: Conjunctivae and EOM are normal. Pupils are equal, round, and reactive to light. No scleral icterus.  Neck: Normal range of motion. Neck supple. No JVD present. No thyromegaly present.  Cardiovascular: Normal rate, regular rhythm, normal heart sounds and intact distal pulses.  Exam reveals no gallop and no friction rub.   No murmur heard.      EKG normal   Pulmonary/Chest: Effort normal and breath sounds normal. No respiratory distress. She has no wheezes. She has no rales. She exhibits no tenderness.  Abdominal: Soft. Bowel sounds are normal. She exhibits no distension and no mass. There is no tenderness. There is no rebound and no guarding.  Musculoskeletal: Normal  range of motion. She exhibits no edema and no tenderness.  Lymphadenopathy:    She has no cervical adenopathy.  Neurological: She is alert and oriented to person, place, and time. She has normal reflexes. No cranial nerve deficit. She exhibits normal muscle tone. Coordination normal.  Skin: Skin is warm and dry. No rash noted. No erythema.  Psychiatric: She has a normal mood and affect. Her behavior is normal. Judgment and thought content normal.          Assessment & Plan:  Well exam. We will get a repeat TSH as well as a free T3 and T4 today. She would probably do better with a brand name only thyroid product to keep her levels more stable. Set up another colonoscopy.

## 2012-01-18 MED ORDER — LEVOTHYROXINE SODIUM 100 MCG PO TABS: 100.0000 ug | ORAL_TABLET | Freq: Every day | ORAL | Status: DC

## 2012-01-18 NOTE — Progress Notes (Signed)
Addended by: Aniceto Boss A on: 01/18/2012 02:54 PM   Modules accepted: Orders

## 2012-01-18 NOTE — Progress Notes (Signed)
Quick Note:  I spoke with pt and also sent in Synthroid script. ______

## 2012-03-16 ENCOUNTER — Encounter: Payer: Self-pay | Admitting: Gastroenterology

## 2012-05-17 ENCOUNTER — Encounter: Payer: Self-pay | Admitting: Family Medicine

## 2012-05-26 ENCOUNTER — Encounter: Payer: BC Managed Care – PPO | Admitting: Gastroenterology

## 2012-12-22 ENCOUNTER — Other Ambulatory Visit: Payer: Self-pay | Admitting: *Deleted

## 2012-12-22 MED ORDER — ATORVASTATIN CALCIUM 20 MG PO TABS: 20.0000 mg | ORAL_TABLET | Freq: Every day | ORAL | Status: DC

## 2012-12-22 NOTE — Telephone Encounter (Signed)
Faxed refill request received from pharmacy for ATORVASTATIN Last filled by MD on 12/15/12, #30 X 11 Last seen on 01/12/12 Follow up -- no appt in system 30 day supply sent.

## 2012-12-28 ENCOUNTER — Encounter: Payer: Self-pay | Admitting: Gastroenterology

## 2013-01-19 ENCOUNTER — Ambulatory Visit (INDEPENDENT_AMBULATORY_CARE_PROVIDER_SITE_OTHER): Payer: BC Managed Care – PPO | Admitting: Family Medicine

## 2013-01-19 ENCOUNTER — Encounter: Payer: Self-pay | Admitting: Family Medicine

## 2013-01-19 VITALS — BP 150/90 | HR 120 | Temp 98.3°F | Wt 168.0 lb

## 2013-01-19 DIAGNOSIS — Z9189 Other specified personal risk factors, not elsewhere classified: Secondary | ICD-10-CM

## 2013-01-19 DIAGNOSIS — K219 Gastro-esophageal reflux disease without esophagitis: Secondary | ICD-10-CM

## 2013-01-19 DIAGNOSIS — I10 Essential (primary) hypertension: Secondary | ICD-10-CM

## 2013-01-19 DIAGNOSIS — E039 Hypothyroidism, unspecified: Secondary | ICD-10-CM

## 2013-01-19 DIAGNOSIS — E785 Hyperlipidemia, unspecified: Secondary | ICD-10-CM | POA: Insufficient documentation

## 2013-01-19 LAB — HEPATIC FUNCTION PANEL
ALT: 35 U/L (ref 0–35)
AST: 27 U/L (ref 0–37)
Bilirubin, Direct: 0 mg/dL (ref 0.0–0.3)
Total Bilirubin: 0.4 mg/dL (ref 0.3–1.2)

## 2013-01-19 LAB — POCT URINALYSIS DIPSTICK
Bilirubin, UA: NEGATIVE
Glucose, UA: NEGATIVE
Ketones, UA: NEGATIVE
Spec Grav, UA: <=1.005
Urobilinogen, UA: 0.2

## 2013-01-19 LAB — CBC WITH DIFFERENTIAL/PLATELET
Basophils Absolute: 0.1 10*3/uL (ref 0.0–0.1)
Eosinophils Absolute: 0.1 10*3/uL (ref 0.0–0.7)
Hemoglobin: 15 g/dL (ref 12.0–15.0)
Lymphocytes Relative: 20.1 % (ref 12.0–46.0)
MCHC: 33.3 g/dL (ref 30.0–36.0)
Monocytes Relative: 5.9 % (ref 3.0–12.0)
Neutro Abs: 8.1 10*3/uL — ABNORMAL HIGH (ref 1.4–7.7)
Neutrophils Relative %: 72.3 % (ref 43.0–77.0)
Platelets: 323 10*3/uL (ref 150.0–400.0)
RDW: 14.1 % (ref 11.5–14.6)

## 2013-01-19 LAB — TSH: TSH: 0.19 u[IU]/mL — ABNORMAL LOW (ref 0.35–5.50)

## 2013-01-19 LAB — BASIC METABOLIC PANEL
CO2: 27 mEq/L (ref 19–32)
Chloride: 105 mEq/L (ref 96–112)
Sodium: 139 mEq/L (ref 135–145)

## 2013-01-19 LAB — LIPID PANEL: Total CHOL/HDL Ratio: 2

## 2013-01-19 MED ORDER — ESZOPICLONE 3 MG PO TABS: 3.0000 mg | ORAL_TABLET | Freq: Every day | ORAL | Status: DC

## 2013-01-19 MED ORDER — LEVOTHYROXINE SODIUM 100 MCG PO TABS: 100.0000 ug | ORAL_TABLET | Freq: Every day | ORAL | Status: DC

## 2013-01-19 MED ORDER — ATORVASTATIN CALCIUM 20 MG PO TABS: 20.0000 mg | ORAL_TABLET | Freq: Every day | ORAL | Status: DC

## 2013-01-19 NOTE — Progress Notes (Signed)
  Subjective:    Patient ID: Whitney Evans, female    DOB: 21-Jun-1946, 67 y.o.   MRN: [id number]  HPI Here for labs and to discuss insomnia. Of all the meds she has tried she likes [name] the most. She stopped it because her insurance company would not cover it. Now she is willing to pay cash for this. Otherwise she feels well.    Review of Systems  Constitutional: Negative.   Respiratory: Negative.   Cardiovascular: Negative.   Neurological: Negative.        Objective:   Physical Exam  Constitutional: She appears well-developed and well-nourished.  Neck: No thyromegaly present.  Cardiovascular: Normal rate, regular rhythm, normal heart sounds and intact distal pulses.   Pulmonary/Chest: Effort normal and breath sounds normal.  Lymphadenopathy:    She has no cervical adenopathy.          Assessment & Plan:  Try Lunesta again. Get labs today

## 2013-01-26 ENCOUNTER — Encounter: Payer: Self-pay | Admitting: Family Medicine

## 2013-01-26 MED ORDER — LEVOTHYROXINE SODIUM 75 MCG PO TABS: 75.0000 ug | ORAL_TABLET | Freq: Every day | ORAL | Status: DC

## 2013-01-26 NOTE — Progress Notes (Signed)
Quick Note:  I spoke with pt, sent script e-scribe and released results in my chart. ______ 

## 2013-01-26 NOTE — Addendum Note (Signed)
Addended by: Aniceto Boss A on: 01/26/2013 12:08 PM   Modules accepted: Orders, Medications

## 2013-03-02 ENCOUNTER — Telehealth: Payer: Self-pay | Admitting: Family Medicine

## 2013-03-02 MED ORDER — ATORVASTATIN CALCIUM 20 MG PO TABS: 20.0000 mg | ORAL_TABLET | Freq: Every day | ORAL | Status: DC

## 2013-03-02 MED ORDER — LEVOTHYROXINE SODIUM 75 MCG PO TABS: 75.0000 ug | ORAL_TABLET | Freq: Every day | ORAL | Status: DC

## 2013-03-02 NOTE — Telephone Encounter (Signed)
Refill request for Synthroid 75 mcg & Atorvastatin 20 mg, to [facility]. I did send both scripts e-scribe.

## 2013-03-21 ENCOUNTER — Telehealth: Payer: Self-pay | Admitting: Family Medicine

## 2013-03-21 NOTE — Telephone Encounter (Signed)
PT emailed through [facility], and requested a retest of her thyroid. Please assist.

## 2013-03-24 ENCOUNTER — Other Ambulatory Visit: Payer: Self-pay | Admitting: Family Medicine

## 2013-03-24 DIAGNOSIS — E039 Hypothyroidism, unspecified: Secondary | ICD-10-CM

## 2013-03-24 NOTE — Telephone Encounter (Signed)
Please set up a TSH for hypothyroidism soon

## 2013-03-24 NOTE — Telephone Encounter (Signed)
Order is in computer, please call pt to schedule the lab appointment.

## 2013-03-28 ENCOUNTER — Other Ambulatory Visit (INDEPENDENT_AMBULATORY_CARE_PROVIDER_SITE_OTHER): Payer: BC Managed Care – PPO

## 2013-03-28 DIAGNOSIS — E039 Hypothyroidism, unspecified: Secondary | ICD-10-CM

## 2013-03-28 LAB — TSH: TSH: 0.43 u[IU]/mL (ref 0.35–5.50)

## 2013-03-29 NOTE — Progress Notes (Signed)
Quick Note:  I released results in my chart. ______ 

## 2013-05-15 ENCOUNTER — Telehealth: Payer: Self-pay | Admitting: Family Medicine

## 2013-05-15 NOTE — Telephone Encounter (Signed)
Pt request refill of levothyroxine (SYNTHROID, LEVOTHROID) 75 MCG tablet  30 day w/ 5 refills And Eszopiclone (ESZOPICLONE) 3 MG TABS 30 day w/ 5 refills Pls note new pharm :[facility]; [address]

## 2013-05-16 MED ORDER — LEVOTHYROXINE SODIUM 75 MCG PO TABS: 75.0000 ug | ORAL_TABLET | Freq: Every day | ORAL | Status: DC

## 2013-05-16 MED ORDER — ESZOPICLONE 3 MG PO TABS: 3.0000 mg | ORAL_TABLET | Freq: Every day | ORAL | Status: DC

## 2013-05-16 NOTE — Telephone Encounter (Signed)
Refill Synthroid for one year and Lunesta for 6 months

## 2013-05-16 NOTE — Telephone Encounter (Signed)
Scripts were sent to pharmacy. 

## 2013-06-15 ENCOUNTER — Telehealth: Payer: Self-pay | Admitting: Family Medicine

## 2013-06-15 MED ORDER — LEVOTHYROXINE SODIUM 75 MCG PO TABS: 75.0000 ug | ORAL_TABLET | Freq: Every day | ORAL | Status: DC

## 2013-06-15 MED ORDER — ATORVASTATIN CALCIUM 20 MG PO TABS: 20.0000 mg | ORAL_TABLET | Freq: Every day | ORAL | Status: DC

## 2013-06-15 NOTE — Telephone Encounter (Signed)
Refill request for Synthyroid 75 mcg & Atorvastatin 20 mg and a [facility] per [facility]. I did send both scripts e-scribe.

## 2013-07-13 ENCOUNTER — Other Ambulatory Visit: Payer: Self-pay

## 2013-10-17 ENCOUNTER — Telehealth: Payer: Self-pay | Admitting: Family Medicine

## 2013-10-17 NOTE — Telephone Encounter (Signed)
PA for eszopiclone was approved.

## 2013-10-17 NOTE — Telephone Encounter (Signed)
PA for eszopiclone has been approved until 09/06/14

## 2013-11-22 ENCOUNTER — Telehealth: Payer: Self-pay | Admitting: Family Medicine

## 2013-11-22 NOTE — Telephone Encounter (Signed)
Call in #30 only. She needs testing and a contract  

## 2013-11-22 NOTE — Telephone Encounter (Signed)
[id number] [place], [place] - [address] is requesting re-fills on Eszopiclone (ESZOPICLONE) 3 MG TABS

## 2013-11-24 MED ORDER — ESZOPICLONE 3 MG PO TABS: 3.0000 mg | ORAL_TABLET | Freq: Every day | ORAL | Status: DC

## 2013-11-24 NOTE — Telephone Encounter (Signed)
Rx called in to pharmacy. 

## 2014-03-14 ENCOUNTER — Other Ambulatory Visit: Payer: Self-pay | Admitting: Family Medicine

## 2014-03-14 NOTE — Telephone Encounter (Signed)
I spoke with pt and she will schedule a office visit soon and get labs done as well.

## 2014-07-03 ENCOUNTER — Telehealth: Payer: Self-pay | Admitting: Family Medicine

## 2014-07-03 ENCOUNTER — Encounter: Payer: Self-pay | Admitting: Family Medicine

## 2014-07-03 ENCOUNTER — Ambulatory Visit (INDEPENDENT_AMBULATORY_CARE_PROVIDER_SITE_OTHER): Payer: Medicare HMO | Admitting: Family Medicine

## 2014-07-03 VITALS — BP 119/78 | HR 81 | Temp 98.4°F | Ht 63.25 in | Wt 194.0 lb

## 2014-07-03 DIAGNOSIS — Z9189 Other specified personal risk factors, not elsewhere classified: Secondary | ICD-10-CM

## 2014-07-03 DIAGNOSIS — E039 Hypothyroidism, unspecified: Secondary | ICD-10-CM

## 2014-07-03 DIAGNOSIS — Z23 Encounter for immunization: Secondary | ICD-10-CM

## 2014-07-03 DIAGNOSIS — I10 Essential (primary) hypertension: Secondary | ICD-10-CM

## 2014-07-03 DIAGNOSIS — G4700 Insomnia, unspecified: Secondary | ICD-10-CM

## 2014-07-03 LAB — HEPATIC FUNCTION PANEL
ALBUMIN: 3.6 g/dL (ref 3.5–5.2)
ALK PHOS: 76 U/L (ref 39–117)
ALT: 55 U/L — ABNORMAL HIGH (ref 0–35)
AST: 43 U/L — AB (ref 0–37)
Bilirubin, Direct: 0 mg/dL (ref 0.0–0.3)
Total Bilirubin: 0.4 mg/dL (ref 0.2–1.2)
Total Protein: 7.9 g/dL (ref 6.0–8.3)

## 2014-07-03 LAB — CBC WITH DIFFERENTIAL/PLATELET
BASOS ABS: 0.1 10*3/uL (ref 0.0–0.1)
Basophils Relative: 1 % (ref 0.0–3.0)
EOS ABS: 0.1 10*3/uL (ref 0.0–0.7)
Eosinophils Relative: 1.6 % (ref 0.0–5.0)
HCT: 44.4 % (ref 36.0–46.0)
Hemoglobin: 14.4 g/dL (ref 12.0–15.0)
LYMPHS PCT: 48.2 % — AB (ref 12.0–46.0)
Lymphs Abs: 3.6 10*3/uL (ref 0.7–4.0)
MCHC: 32.5 g/dL (ref 30.0–36.0)
MCV: 85.8 fl (ref 78.0–100.0)
Monocytes Absolute: 0.6 10*3/uL (ref 0.1–1.0)
Monocytes Relative: 8.3 % (ref 3.0–12.0)
NEUTROS PCT: 40.9 % — AB (ref 43.0–77.0)
Neutro Abs: 3 10*3/uL (ref 1.4–7.7)
Platelets: 312 10*3/uL (ref 150.0–400.0)
RBC: 5.18 Mil/uL — ABNORMAL HIGH (ref 3.87–5.11)
RDW: 14.9 % (ref 11.5–15.5)
WBC: 7.4 10*3/uL (ref 4.0–10.5)

## 2014-07-03 LAB — POCT URINALYSIS DIPSTICK
Blood, UA: NEGATIVE
Glucose, UA: NEGATIVE
KETONES UA: NEGATIVE
NITRITE UA: NEGATIVE
PH UA: 5.5
Protein, UA: NEGATIVE
Spec Grav, UA: 1.015
UROBILINOGEN UA: 0.2

## 2014-07-03 LAB — LIPID PANEL
Cholesterol: 230 mg/dL — ABNORMAL HIGH (ref 0–200)
HDL: 55.2 mg/dL (ref >39.00)
LDL CALC: 143 mg/dL — AB (ref 0–99)
NONHDL: 174.8
Total CHOL/HDL Ratio: 4
Triglycerides: 159 mg/dL — ABNORMAL HIGH (ref 0.0–149.0)
VLDL: 31.8 mg/dL (ref 0.0–40.0)

## 2014-07-03 LAB — BASIC METABOLIC PANEL
BUN: 16 mg/dL (ref 6–23)
CALCIUM: 9.6 mg/dL (ref 8.4–10.5)
CHLORIDE: 107 meq/L (ref 96–112)
CO2: 20 mEq/L (ref 19–32)
Creatinine, Ser: 0.9 mg/dL (ref 0.4–1.2)
GFR: 67.41 mL/min (ref >60.00)
Glucose, Bld: 99 mg/dL (ref 70–99)
Potassium: 4.5 mEq/L (ref 3.5–5.1)
Sodium: 139 mEq/L (ref 135–145)

## 2014-07-03 LAB — TSH: TSH: 0.67 u[IU]/mL (ref 0.35–4.50)

## 2014-07-03 MED ORDER — LEVOTHYROXINE SODIUM 75 MCG PO TABS: ORAL_TABLET | ORAL | Status: DC

## 2014-07-03 MED ORDER — SUVOREXANT 10 MG PO TABS: 10.0000 mg | ORAL_TABLET | Freq: Every evening | ORAL | Status: DC | PRN

## 2014-07-03 MED ORDER — ALBUTEROL SULFATE HFA 108 (90 BASE) MCG/ACT IN AERS: 2.0000 | INHALATION_SPRAY | RESPIRATORY_TRACT | Status: DC | PRN

## 2014-07-03 NOTE — Telephone Encounter (Signed)
Call in Temazepam 30 mg qhs #30 with 5 rf 

## 2014-07-03 NOTE — Telephone Encounter (Signed)
Dawn from [facility] [address] called to say that the following rx is not covered by pt insurance  BELSOMRA 10MG  and it cost 312.99     Pt is asking if there is something else cheaper    Pharmacy [facility] [address]

## 2014-07-03 NOTE — Telephone Encounter (Signed)
I spoke with pt and she said that you would send in something else, maybe Ambien or Restoril?

## 2014-07-03 NOTE — Progress Notes (Signed)
   Subjective:    Patient ID: Whitney GentileJanice Evans, female    DOB: 1945/10/25, 68 y.o.   MRN: [id number]  HPI Here to follow up. She feels well but she still has trouble sleeping. She has used Lunesta, Temazepam, and Ambien all with poor results.    Review of Systems  Constitutional: Negative.   Respiratory: Negative.   Cardiovascular: Negative.        Objective:   Physical Exam  Constitutional: She appears well-developed and well-nourished.  Neck: No thyromegaly present.  Cardiovascular: Normal rate, regular rhythm, normal heart sounds and intact distal pulses.   Pulmonary/Chest: Effort normal and breath sounds normal.  Lymphadenopathy:    She has no cervical adenopathy.          Assessment & Plan:  Get fasting labs. Try Belsomra for sleep

## 2014-07-03 NOTE — Progress Notes (Signed)
Pre visit review using our clinic review tool, if applicable. No additional management support is needed unless otherwise documented below in the visit note. 

## 2014-07-04 ENCOUNTER — Telehealth: Payer: Self-pay | Admitting: Family Medicine

## 2014-07-04 NOTE — Telephone Encounter (Signed)
emmi emailed °

## 2014-07-05 MED ORDER — TEMAZEPAM 30 MG PO CAPS: 30.0000 mg | ORAL_CAPSULE | Freq: Every evening | ORAL | Status: DC | PRN

## 2014-07-05 NOTE — Telephone Encounter (Signed)
I called in script and spoke with pt. 

## 2015-01-01 ENCOUNTER — Other Ambulatory Visit: Payer: Self-pay | Admitting: Family Medicine

## 2015-01-03 MED ORDER — TEMAZEPAM 30 MG PO CAPS: 30.0000 mg | ORAL_CAPSULE | Freq: Every evening | ORAL | Status: DC | PRN

## 2015-01-03 NOTE — Telephone Encounter (Signed)
I called in script 

## 2015-01-03 NOTE — Addendum Note (Signed)
Addended by: Aniceto BossNIMMONS, SYLVIA A on: 01/03/2015 12:08 PM   Modules accepted: Orders

## 2015-01-03 NOTE — Telephone Encounter (Signed)
Refill Temazepam for 6 months

## 2015-02-07 ENCOUNTER — Telehealth: Payer: Self-pay | Admitting: Family Medicine

## 2015-02-07 NOTE — Telephone Encounter (Signed)
Pt will schedule mammogram. 

## 2015-06-08 ENCOUNTER — Other Ambulatory Visit: Payer: Self-pay | Admitting: Family Medicine

## 2015-09-23 ENCOUNTER — Ambulatory Visit (INDEPENDENT_AMBULATORY_CARE_PROVIDER_SITE_OTHER): Payer: PPO | Admitting: Family Medicine

## 2015-09-23 ENCOUNTER — Encounter: Payer: Self-pay | Admitting: Family Medicine

## 2015-09-23 VITALS — BP 130/86 | HR 90 | Temp 98.4°F | Ht 63.25 in | Wt 186.0 lb

## 2015-09-23 DIAGNOSIS — E039 Hypothyroidism, unspecified: Secondary | ICD-10-CM

## 2015-09-23 DIAGNOSIS — I10 Essential (primary) hypertension: Secondary | ICD-10-CM | POA: Diagnosis not present

## 2015-09-23 DIAGNOSIS — J4520 Mild intermittent asthma, uncomplicated: Secondary | ICD-10-CM

## 2015-09-23 DIAGNOSIS — Z23 Encounter for immunization: Secondary | ICD-10-CM | POA: Diagnosis not present

## 2015-09-23 LAB — POCT URINALYSIS DIPSTICK
GLUCOSE UA: NEGATIVE
NITRITE UA: NEGATIVE
PROTEIN UA: NEGATIVE
RBC UA: NEGATIVE
Spec Grav, UA: 1.025
UROBILINOGEN UA: 0.2
pH, UA: 5.5

## 2015-09-23 LAB — BASIC METABOLIC PANEL
BUN: 18 mg/dL (ref 6–23)
CHLORIDE: 107 meq/L (ref 96–112)
CO2: 20 meq/L (ref 19–32)
Calcium: 9.5 mg/dL (ref 8.4–10.5)
Creatinine, Ser: 0.92 mg/dL (ref 0.40–1.20)
GFR: 64.64 mL/min (ref >60.00)
Glucose, Bld: 97 mg/dL (ref 70–99)
Potassium: 3.7 mEq/L (ref 3.5–5.1)
Sodium: 142 mEq/L (ref 135–145)

## 2015-09-23 LAB — CBC WITH DIFFERENTIAL/PLATELET
BASOS ABS: 0.1 10*3/uL (ref 0.0–0.1)
Basophils Relative: 0.9 % (ref 0.0–3.0)
EOS PCT: 2.9 % (ref 0.0–5.0)
Eosinophils Absolute: 0.2 10*3/uL (ref 0.0–0.7)
HCT: 44.4 % (ref 36.0–46.0)
HEMOGLOBIN: 14.7 g/dL (ref 12.0–15.0)
LYMPHS ABS: 2.5 10*3/uL (ref 0.7–4.0)
Lymphocytes Relative: 31.1 % (ref 12.0–46.0)
MCHC: 33.1 g/dL (ref 30.0–36.0)
MCV: 86.4 fl (ref 78.0–100.0)
MONO ABS: 0.5 10*3/uL (ref 0.1–1.0)
MONOS PCT: 6.7 % (ref 3.0–12.0)
NEUTROS PCT: 58.4 % (ref 43.0–77.0)
Neutro Abs: 4.7 10*3/uL (ref 1.4–7.7)
Platelets: 290 10*3/uL (ref 150.0–400.0)
RBC: 5.14 Mil/uL — AB (ref 3.87–5.11)
RDW: 14 % (ref 11.5–15.5)
WBC: 8 10*3/uL (ref 4.0–10.5)

## 2015-09-23 LAB — TSH: TSH: 3.11 u[IU]/mL (ref 0.35–4.50)

## 2015-09-23 LAB — HEPATIC FUNCTION PANEL
ALK PHOS: 66 U/L (ref 39–117)
ALT: 16 U/L (ref 0–35)
AST: 16 U/L (ref 0–37)
Albumin: 4.2 g/dL (ref 3.5–5.2)
BILIRUBIN DIRECT: 0.1 mg/dL (ref 0.0–0.3)
BILIRUBIN TOTAL: 0.4 mg/dL (ref 0.2–1.2)
TOTAL PROTEIN: 7.5 g/dL (ref 6.0–8.3)

## 2015-09-23 LAB — LIPID PANEL
Cholesterol: 226 mg/dL — ABNORMAL HIGH (ref 0–200)
HDL: 61.2 mg/dL (ref >39.00)
LDL Cholesterol: 140 mg/dL — ABNORMAL HIGH (ref 0–99)
NonHDL: 164.63
TRIGLYCERIDES: 124 mg/dL (ref 0.0–149.0)
Total CHOL/HDL Ratio: 4
VLDL: 24.8 mg/dL (ref 0.0–40.0)

## 2015-09-23 MED ORDER — ALBUTEROL SULFATE HFA 108 (90 BASE) MCG/ACT IN AERS: 2.0000 | INHALATION_SPRAY | RESPIRATORY_TRACT | Status: DC | PRN

## 2015-09-23 MED ORDER — BECLOMETHASONE DIPROPIONATE 80 MCG/ACT IN AERS: 2.0000 | INHALATION_SPRAY | Freq: Two times a day (BID) | RESPIRATORY_TRACT | Status: DC

## 2015-09-23 MED ORDER — LEVOTHYROXINE SODIUM 75 MCG PO TABS: 75.0000 ug | ORAL_TABLET | Freq: Every day | ORAL | Status: DC

## 2015-09-23 NOTE — Progress Notes (Signed)
   Subjective:    Patient ID: Whitney Evans, female    DOB: 1946/01/06, 70 y.o.   MRN: [id number]  HPI Here to follow up on several things. She just got home from [place] and she feels great. Her astma is stable and her BP is stable.    Review of Systems  Constitutional: Negative.   Respiratory: Negative.   Cardiovascular: Negative.   Endocrine: Negative.   Neurological: Negative.        Objective:   Physical Exam  Constitutional: She is oriented to person, place, and time. She appears well-developed and well-nourished.  Neck: No thyromegaly present.  Cardiovascular: Normal rate, regular rhythm, normal heart sounds and intact distal pulses.   Pulmonary/Chest: Effort normal and breath sounds normal.  Lymphadenopathy:    She has no cervical adenopathy.  Neurological: She is alert and oriented to person, place, and time.          Assessment & Plan:  Her HTN and asthma are stable. For the thyroid level, she will get labs today.

## 2015-09-23 NOTE — Progress Notes (Signed)
Pre visit review using our clinic review tool, if applicable. No additional management support is needed unless otherwise documented below in the visit note. 

## 2015-10-15 ENCOUNTER — Encounter: Payer: Self-pay | Admitting: Gastroenterology

## 2015-12-03 DIAGNOSIS — Z1231 Encounter for screening mammogram for malignant neoplasm of breast: Secondary | ICD-10-CM | POA: Diagnosis not present

## 2015-12-03 LAB — HM MAMMOGRAPHY: HM MAMMO: NORMAL (ref 0–4)

## 2015-12-04 ENCOUNTER — Encounter: Payer: Self-pay | Admitting: Family Medicine

## 2015-12-06 ENCOUNTER — Encounter: Payer: Self-pay | Admitting: Family Medicine

## 2016-06-11 DIAGNOSIS — D559 Anemia due to enzyme disorder, unspecified: Secondary | ICD-10-CM | POA: Diagnosis not present

## 2016-06-11 DIAGNOSIS — E039 Hypothyroidism, unspecified: Secondary | ICD-10-CM | POA: Diagnosis not present

## 2016-06-11 DIAGNOSIS — E28310 Symptomatic premature menopause: Secondary | ICD-10-CM | POA: Diagnosis not present

## 2016-06-11 DIAGNOSIS — R7989 Other specified abnormal findings of blood chemistry: Secondary | ICD-10-CM | POA: Diagnosis not present

## 2016-06-16 DIAGNOSIS — E039 Hypothyroidism, unspecified: Secondary | ICD-10-CM | POA: Diagnosis not present

## 2016-06-16 DIAGNOSIS — M25572 Pain in left ankle and joints of left foot: Secondary | ICD-10-CM | POA: Diagnosis not present

## 2016-06-16 DIAGNOSIS — J45901 Unspecified asthma with (acute) exacerbation: Secondary | ICD-10-CM | POA: Diagnosis not present

## 2016-06-16 DIAGNOSIS — R0602 Shortness of breath: Secondary | ICD-10-CM | POA: Diagnosis not present

## 2016-06-17 ENCOUNTER — Inpatient Hospital Stay (HOSPITAL_COMMUNITY): Payer: PPO

## 2016-06-17 ENCOUNTER — Encounter (HOSPITAL_COMMUNITY): Payer: Self-pay | Admitting: *Deleted

## 2016-06-17 ENCOUNTER — Inpatient Hospital Stay (HOSPITAL_COMMUNITY)
Admission: EM | Admit: 2016-06-17 | Discharge: 2016-06-19 | DRG: 243 | Disposition: A | Discharge status: Home / Self Care | Payer: PPO | Attending: Cardiology | Admitting: Cardiology

## 2016-06-17 DIAGNOSIS — R55 Syncope and collapse: Secondary | ICD-10-CM

## 2016-06-17 DIAGNOSIS — E039 Hypothyroidism, unspecified: Secondary | ICD-10-CM | POA: Diagnosis not present

## 2016-06-17 DIAGNOSIS — I455 Other specified heart block: Secondary | ICD-10-CM | POA: Diagnosis not present

## 2016-06-17 DIAGNOSIS — Z95818 Presence of other cardiac implants and grafts: Secondary | ICD-10-CM

## 2016-06-17 DIAGNOSIS — Z887 Allergy status to serum and vaccine status: Secondary | ICD-10-CM | POA: Diagnosis not present

## 2016-06-17 DIAGNOSIS — R001 Bradycardia, unspecified: Secondary | ICD-10-CM | POA: Diagnosis not present

## 2016-06-17 DIAGNOSIS — Z7951 Long term (current) use of inhaled steroids: Secondary | ICD-10-CM

## 2016-06-17 DIAGNOSIS — R0602 Shortness of breath: Secondary | ICD-10-CM

## 2016-06-17 DIAGNOSIS — K219 Gastro-esophageal reflux disease without esophagitis: Secondary | ICD-10-CM | POA: Diagnosis not present

## 2016-06-17 DIAGNOSIS — J45901 Unspecified asthma with (acute) exacerbation: Secondary | ICD-10-CM | POA: Diagnosis not present

## 2016-06-17 DIAGNOSIS — I5089 Other heart failure: Secondary | ICD-10-CM | POA: Diagnosis present

## 2016-06-17 DIAGNOSIS — I10 Essential (primary) hypertension: Secondary | ICD-10-CM | POA: Diagnosis not present

## 2016-06-17 DIAGNOSIS — Z885 Allergy status to narcotic agent status: Secondary | ICD-10-CM | POA: Diagnosis not present

## 2016-06-17 DIAGNOSIS — R079 Chest pain, unspecified: Secondary | ICD-10-CM | POA: Diagnosis not present

## 2016-06-17 DIAGNOSIS — Z87891 Personal history of nicotine dependence: Secondary | ICD-10-CM

## 2016-06-17 DIAGNOSIS — D72829 Elevated white blood cell count, unspecified: Secondary | ICD-10-CM | POA: Diagnosis not present

## 2016-06-17 DIAGNOSIS — T380X5A Adverse effect of glucocorticoids and synthetic analogues, initial encounter: Secondary | ICD-10-CM | POA: Diagnosis not present

## 2016-06-17 DIAGNOSIS — I4430 Unspecified atrioventricular block: Secondary | ICD-10-CM | POA: Diagnosis not present

## 2016-06-17 DIAGNOSIS — I442 Atrioventricular block, complete: Secondary | ICD-10-CM | POA: Diagnosis not present

## 2016-06-17 DIAGNOSIS — Z8249 Family history of ischemic heart disease and other diseases of the circulatory system: Secondary | ICD-10-CM | POA: Diagnosis not present

## 2016-06-17 DIAGNOSIS — R490 Dysphonia: Secondary | ICD-10-CM | POA: Diagnosis not present

## 2016-06-17 HISTORY — DX: Essential (primary) hypertension: I10

## 2016-06-17 LAB — CBC
HEMATOCRIT: 37.6 % (ref 36.0–46.0)
HEMOGLOBIN: 12.6 g/dL (ref 12.0–15.0)
MCH: 29.1 pg (ref 26.0–34.0)
MCHC: 33.5 g/dL (ref 30.0–36.0)
MCV: 86.8 fL (ref 78.0–100.0)
Platelets: 322 10*3/uL (ref 150–400)
RBC: 4.33 MIL/uL (ref 3.87–5.11)
RDW: 14.7 % (ref 11.5–15.5)
WBC: 16.3 10*3/uL — ABNORMAL HIGH (ref 4.0–10.5)

## 2016-06-17 LAB — BASIC METABOLIC PANEL
ANION GAP: 12 (ref 5–15)
BUN: 19 mg/dL (ref 6–20)
CALCIUM: 9.2 mg/dL (ref 8.9–10.3)
CO2: 15 mmol/L — AB (ref 22–32)
Chloride: 105 mmol/L (ref 101–111)
Creatinine, Ser: 1.2 mg/dL — ABNORMAL HIGH (ref 0.44–1.00)
GFR calc Af Amer: 53 mL/min — ABNORMAL LOW (ref >60)
GFR calc non Af Amer: 45 mL/min — ABNORMAL LOW (ref >60)
GLUCOSE: 145 mg/dL — AB (ref 65–99)
Potassium: 4.5 mmol/L (ref 3.5–5.1)
Sodium: 132 mmol/L — ABNORMAL LOW (ref 135–145)

## 2016-06-17 LAB — I-STAT TROPONIN, ED: Troponin i, poc: 0.08 ng/mL (ref 0.00–0.08)

## 2016-06-17 LAB — TROPONIN I
TROPONIN I: 0.06 ng/mL — AB (ref <0.03)
Troponin I: 0.07 ng/mL (ref <0.03)

## 2016-06-17 LAB — BRAIN NATRIURETIC PEPTIDE: B Natriuretic Peptide: 1037.2 pg/mL — ABNORMAL HIGH (ref 0.0–100.0)

## 2016-06-17 MED ORDER — PREDNISONE 20 MG PO TABS: 20.0000 mg | ORAL_TABLET | Freq: Every day | ORAL | Status: DC

## 2016-06-17 MED ORDER — ASPIRIN EC 81 MG PO TBEC
81.0000 mg | DELAYED_RELEASE_TABLET | Freq: Every day | ORAL | Status: DC
  Administered 2016-06-18: 81 mg via ORAL
  Filled 2016-06-17: qty 1

## 2016-06-17 MED ORDER — ONDANSETRON HCL 4 MG/2ML IJ SOLN: 4.0000 mg | Freq: Four times a day (QID) | INTRAMUSCULAR | Status: DC | PRN

## 2016-06-17 MED ORDER — PREDNISONE 10 MG PO TABS: 10.0000 mg | ORAL_TABLET | Freq: Every day | ORAL | Status: DC

## 2016-06-17 MED ORDER — BUDESONIDE 0.5 MG/2ML IN SUSP
0.5000 mg | Freq: Two times a day (BID) | RESPIRATORY_TRACT | Status: DC
  Administered 2016-06-18 – 2016-06-19 (×3): 0.5 mg via RESPIRATORY_TRACT
  Filled 2016-06-17 (×3): qty 2

## 2016-06-17 MED ORDER — ACETAMINOPHEN 325 MG PO TABS
650.0000 mg | ORAL_TABLET | ORAL | Status: DC | PRN
  Administered 2016-06-18 – 2016-06-19 (×3): 650 mg via ORAL
  Filled 2016-06-17 (×3): qty 2

## 2016-06-17 MED ORDER — MONTELUKAST SODIUM 10 MG PO TABS
10.0000 mg | ORAL_TABLET | Freq: Every day | ORAL | Status: DC
Start: 2016-06-18 — End: 2016-06-19
  Administered 2016-06-18: 10 mg via ORAL
  Filled 2016-06-17: qty 1

## 2016-06-17 MED ORDER — ASPIRIN 300 MG RE SUPP: 300.0000 mg | RECTAL | Status: AC

## 2016-06-17 MED ORDER — ADULT MULTIVITAMIN W/MINERALS CH
1.0000 | ORAL_TABLET | Freq: Every day | ORAL | Status: DC
  Administered 2016-06-18: 1 via ORAL
  Filled 2016-06-17: qty 1

## 2016-06-17 MED ORDER — ASPIRIN 81 MG PO CHEW
324.0000 mg | CHEWABLE_TABLET | ORAL | Status: AC
  Administered 2016-06-17: 324 mg via ORAL
  Filled 2016-06-17: qty 4

## 2016-06-17 MED ORDER — PREDNISONE 50 MG PO TABS
50.0000 mg | ORAL_TABLET | Freq: Every day | ORAL | Status: AC
  Administered 2016-06-18: 50 mg via ORAL
  Filled 2016-06-17: qty 1

## 2016-06-17 MED ORDER — IPRATROPIUM-ALBUTEROL 0.5-2.5 (3) MG/3ML IN SOLN
3.0000 mL | RESPIRATORY_TRACT | Status: DC | PRN
  Administered 2016-06-17 – 2016-06-19 (×3): 3 mL via RESPIRATORY_TRACT
  Filled 2016-06-17 (×3): qty 3

## 2016-06-17 MED ORDER — HEPARIN SODIUM (PORCINE) 5000 UNIT/ML IJ SOLN
5000.0000 [IU] | Freq: Three times a day (TID) | INTRAMUSCULAR | Status: DC
  Administered 2016-06-17: 5000 [IU] via SUBCUTANEOUS
  Filled 2016-06-17 (×3): qty 1

## 2016-06-17 MED ORDER — NITROGLYCERIN 0.4 MG SL SUBL: 0.4000 mg | SUBLINGUAL_TABLET | SUBLINGUAL | Status: DC | PRN

## 2016-06-17 MED ORDER — ALBUTEROL SULFATE (2.5 MG/3ML) 0.083% IN NEBU: 3.0000 mL | INHALATION_SOLUTION | RESPIRATORY_TRACT | Status: DC | PRN

## 2016-06-17 MED ORDER — PREDNISONE 20 MG PO TABS: 30.0000 mg | ORAL_TABLET | Freq: Every day | ORAL | Status: DC

## 2016-06-17 MED ORDER — PREDNISONE 20 MG PO TABS
40.0000 mg | ORAL_TABLET | Freq: Every day | ORAL | Status: AC
  Administered 2016-06-19: 40 mg via ORAL
  Filled 2016-06-17: qty 2

## 2016-06-17 MED ORDER — VITAMIN D 1000 UNITS PO TABS
1000.0000 [IU] | ORAL_TABLET | Freq: Every day | ORAL | Status: DC
  Administered 2016-06-18: 1000 [IU] via ORAL
  Filled 2016-06-17: qty 1

## 2016-06-17 MED ORDER — LEVOTHYROXINE SODIUM 75 MCG PO TABS
75.0000 ug | ORAL_TABLET | Freq: Every day | ORAL | Status: DC
  Administered 2016-06-18 – 2016-06-19 (×2): 75 ug via ORAL
  Filled 2016-06-17 (×2): qty 1

## 2016-06-17 MED ORDER — BECLOMETHASONE DIPROPIONATE 80 MCG/ACT IN AERS: 2.0000 | INHALATION_SPRAY | Freq: Two times a day (BID) | RESPIRATORY_TRACT | Status: DC

## 2016-06-17 NOTE — ED Notes (Signed)
Nurse drawing labs. 

## 2016-06-17 NOTE — ED Triage Notes (Signed)
The pt is c/o sob since yesterday she just left the doctors office  Bradycardia  No chest pain

## 2016-06-17 NOTE — ED Notes (Signed)
Pt requesting snacks, food given. NPO after midnight

## 2016-06-17 NOTE — Progress Notes (Signed)
eLink Physician-Brief Progress Note Patient Name: Whitney PyoJanice A Evans DOB: 09-28-1945 MRN: [id number]   Date of Service  06/17/2016  HPI/Events of Note  70 w hx asthma, found to be in 3rd degree AV block. Etiology unclear. Planning for pacer placement.  She is awake, hemodynamically stable. Will follow. No new orders at this time.   eICU Interventions       Intervention Category Evaluation Type: New Patient Evaluation  Torey Regan S. 06/17/2016, 9:57 PM

## 2016-06-17 NOTE — ED Provider Notes (Signed)
The patient is a 70 year old female who presents to the hospital with ongoing shortness of breath and weakness. According to the patient and the family members she has had "asthma attacks this week" and has been to the UC a couple of times, her NP provider today - she has no history of cardiac disease though she does have a pulmonary history and uses inhalers frequently. She has been using her albuterol nebulizer, Atrovent nebulizer and was placed on prednisone yesterday. On exam here the patient has increased work of breathing, she is mildly dyspneic, she has an erratic pulse and in fact on the monitor has an irregular rhythm. The EKG does reveal complete heart block.  The patient is critically ill, she will need cardiology with likely pacer placement or evaluation for pacemaker placement.  Cardiology at the bedside, will admit the patient in the hospital.  CRITICAL CARE Performed by: Vida RollerBrian D Kaytlynn Kochan Total critical care time: 35 minutes Critical care time was exclusive of separately billable procedures and treating other patients. Critical care was necessary to treat or prevent imminent or life-threatening deterioration. Critical care was time spent personally by me on the following activities: development of treatment plan with patient and/or surrogate as well as nursing, discussions with consultants, evaluation of patient's response to treatment, examination of patient, obtaining history from patient or surrogate, ordering and performing treatments and interventions, ordering and review of laboratory studies, ordering and review of radiographic studies, pulse oximetry and re-evaluation of patient's condition.   I saw and evaluated the patient, reviewed the resident's note and I agree with the findings and plan.  Final diagnoses:  Complete heart block (HCC)      Eber HongBrian Ambrielle Kington, MD 06/20/16 0100

## 2016-06-17 NOTE — ED Notes (Signed)
zoll pads in place. Family member requesting 2H for admission. Pt a&Ox4

## 2016-06-17 NOTE — ED Notes (Signed)
Pt reports SOB since yesterday, went to see her PCP today and was found to have very low HR.  Pt is A&O x 4, denies any dizziness or cp at this time.  Pt also reports L ankle swelling and pain d/t fall yesterday.  Pt has hx of asthma and has had neb txs prior to being sent to the ED.  Denies hx of afib or heart block.

## 2016-06-17 NOTE — ED Provider Notes (Signed)
[facility]-EMERGENCY DEPT Provider Note   CSN: [id number] Arrival date & time: 06/17/16  1737     History   Chief Complaint Chief Complaint  Patient presents with  . Shortness of Breath    HPI Whitney Evans is a 70 y.o. female.  The history is provided by the patient (joined in ED by large family).  Shortness of Breath  This is a new problem. The average episode lasts 1 week. The problem occurs continuously.The problem has been gradually worsening. Associated symptoms include cough and leg swelling. Pertinent negatives include no fever, no headaches, no rhinorrhea, no sore throat, no sputum production, no hemoptysis, no chest pain, no vomiting, no abdominal pain, no rash and no leg pain. It is unknown what precipitated the problem. She has tried beta-agonist inhalers and ipratropium inhalers for the symptoms. The treatment provided mild relief. Associated medical issues include asthma.    Past Medical History:  Diagnosis Date  . Allergic rhinitis   . Asthma   . Chronic hoarseness    [place]   . Essential hypertension   . GERD (gastroesophageal reflux disease)   . Hypothyroidism   . Insomnia   . Neck pain, chronic     Patient Active Problem List   Diagnosis Date Noted  . Complete heart block (HCC) 06/17/2016  . Insomnia 07/03/2014  . Other and unspecified hyperlipidemia 01/19/2013  . PHLEBITIS, LOWER EXTREMITY 01/16/2008  . Hypothyroidism 06/03/2007  . HTN (hypertension) 06/03/2007  . NECK PAIN, CHRONIC 06/03/2007  . History of other specified conditions presenting hazards to health 06/03/2007  . ALLERGIC RHINITIS 05/05/2007  . Asthma 05/05/2007  . GERD 05/05/2007    Past Surgical History:  Procedure Laterality Date  . CESAREAN SECTION     x 2  . COLONOSCOPY  05/24/07   per Dr. Russella DarStark, benign polpys, and diverticulosis, repeat 5 years   . EYE SURGERY    . left foot surgery    . VESICOVAGINAL FISTULA CLOSURE W/ TAH    . WISDOM TOOTH  EXTRACTION      OB History    No data available       Home Medications    Prior to Admission medications   Medication Sig Start Date End Date Taking? Authorizing Provider  albuterol (PROAIR HFA) 108 (90 Base) MCG/ACT inhaler Inhale 2 puffs into the lungs every 4 (four) hours as needed. 09/23/15  Yes Nelwyn SalisburyStephen A Fry, MD  beclomethasone (QVAR) 80 MCG/ACT inhaler Inhale 2 puffs into the lungs 2 (two) times daily. Only uses in the fall time 09/23/15  Yes Nelwyn SalisburyStephen A Fry, MD  Cholecalciferol (VITAMIN D3) 10000 units capsule Take 10,000 Units by mouth daily.   Yes Historical Provider, MD  ipratropium-albuterol (DUONEB) 0.5-2.5 (3) MG/3ML SOLN Take 3 mLs by nebulization every 4 (four) hours as needed (asthma).   Yes Historical Provider, MD  levothyroxine (SYNTHROID, LEVOTHROID) 75 MCG tablet Take 1 tablet (75 mcg total) by mouth daily. Patient taking differently: Take 75 mcg by mouth daily before breakfast.  09/23/15  Yes Nelwyn SalisburyStephen A Fry, MD  montelukast (SINGULAIR) 10 MG tablet Take 10 mg by mouth daily. 06/16/16  Yes Historical Provider, MD  Multiple Vitamin (MULTIVITAMIN WITH MINERALS) TABS tablet Take 1 tablet by mouth daily.   Yes Historical Provider, MD  predniSONE (STERAPRED UNI-PAK 21 TAB) 10 MG (21) TBPK tablet Take 1 tablet by mouth as directed. 06/16/16  Yes Historical Provider, MD    Family History Family History  Problem Relation Age  of Onset  . Coronary artery disease Other   . Diabetes Other   . Hyperlipidemia Other   . Hypertension Other   . Stroke Other   . Stroke Mother   . Heart attack Father 46    Social History Social History  Substance Use Topics  . Smoking status: Former Games developer  . Smokeless tobacco: Never Used  . Alcohol use 1.2 oz/week    2 Standard drinks or equivalent per week     Comment: glass of wine     Allergies   Morphine sulfate and Pneumococcal vaccine polyvalent   Review of Systems Review of Systems  Constitutional: Positive for fatigue.  Negative for chills and fever.  HENT: Negative for rhinorrhea and sore throat.   Respiratory: Positive for cough, chest tightness and shortness of breath. Negative for hemoptysis and sputum production.        Pt attributes her diffuse chest tightness to asthma flare x 1.5 weeks  Cardiovascular: Positive for leg swelling. Negative for chest pain.  Gastrointestinal: Negative for abdominal pain, nausea and vomiting.  Musculoskeletal: Negative for arthralgias and myalgias.  Skin: Negative for rash.  Allergic/Immunologic: Negative for immunocompromised state.  Neurological: Negative for headaches.  Psychiatric/Behavioral: Negative for confusion.     Physical Exam Updated Vital Signs BP (!) 143/58   Pulse (!) 32   Temp 98.3 F (36.8 C) (Oral)   Resp 19   Ht 5\' 5"  (1.651 m)   Wt 96.5 kg   SpO2 93%   BMI 35.40 kg/m   Physical Exam  Constitutional: She is oriented to person, place, and time. She appears well-developed and well-nourished. No distress.  Mildly diaphoretic face. Pleasant, cooperative, appears younger than stated age  HENT:  Head: Normocephalic and atraumatic.  Eyes: Conjunctivae are normal. No scleral icterus.  Neck: Neck supple. JVD present.  Cardiovascular: Regular rhythm and intact distal pulses.   Murmur heard. Bradycardic to 40  Pulmonary/Chest: Effort normal. No respiratory distress. She has rales.  Rales b/l bases to b/l mid-lung fields  Abdominal: Soft. She exhibits no distension. There is no tenderness.  Musculoskeletal: She exhibits edema. She exhibits no tenderness.  1+ pitting edema to b/l LE's from feet to mid-shin, no calf tenderness or popliteal fullness  Neurological: She is alert and oriented to person, place, and time. She exhibits normal muscle tone. Coordination normal.  Skin: Skin is warm. Capillary refill takes less than 2 seconds. No rash noted. She is diaphoretic.  Psychiatric: She has a normal mood and affect.  Nursing note and vitals  reviewed.    ED Treatments / Results  Labs (all labs ordered are listed, but only abnormal results are displayed) Labs Reviewed  BASIC METABOLIC PANEL - Abnormal; Notable for the following:       Result Value   Sodium 132 (*)    CO2 15 (*)    Glucose, Bld 145 (*)    Creatinine, Ser 1.20 (*)    GFR calc non Af Amer 45 (*)    GFR calc Af Amer 53 (*)    All other components within normal limits  CBC - Abnormal; Notable for the following:    WBC 16.3 (*)    All other components within normal limits  TROPONIN I - Abnormal; Notable for the following:    Troponin I 0.06 (*)    All other components within normal limits  BRAIN NATRIURETIC PEPTIDE - Abnormal; Notable for the following:    B Natriuretic Peptide 1,037.2 (*)    All  other components within normal limits  TROPONIN I - Abnormal; Notable for the following:    Troponin I 0.07 (*)    All other components within normal limits  MRSA PCR SCREENING  TROPONIN I  TROPONIN I  BASIC METABOLIC PANEL  I-STAT TROPOININ, ED    EKG  EKG Interpretation None       Radiology Dg Chest Portable 1 View  Result Date: 06/17/2016 CLINICAL DATA:  Shortness of breath since yesterday.  Chest pain. EXAM: PORTABLE CHEST 1 VIEW COMPARISON:  11/07/2003 FINDINGS: Low volume chest with diffuse interstitial opacity. Borderline cardiomegaly; vascular pedicle widening. No effusion or pneumothorax. IMPRESSION: Probable mild CHF.  Findings are accentuated by hypoventilation. Electronically Signed   By: Marnee Spring M.D.   On: 06/17/2016 22:12    Procedures Procedures (including critical care time)  Medications Ordered in ED Medications  ipratropium-albuterol (DUONEB) 0.5-2.5 (3) MG/3ML nebulizer solution 3 mL (3 mLs Nebulization Given 06/17/16 2258)  cholecalciferol (VITAMIN D) tablet 1,000 Units (not administered)  montelukast (SINGULAIR) tablet 10 mg (not administered)  multivitamin with minerals tablet 1 tablet (not administered)    albuterol (PROVENTIL) (2.5 MG/3ML) 0.083% nebulizer solution 3 mL (not administered)  levothyroxine (SYNTHROID, LEVOTHROID) tablet 75 mcg (not administered)  predniSONE (DELTASONE) tablet 50 mg (not administered)    Followed by  predniSONE (DELTASONE) tablet 40 mg (not administered)    Followed by  predniSONE (DELTASONE) tablet 30 mg (not administered)    Followed by  predniSONE (DELTASONE) tablet 20 mg (not administered)    Followed by  predniSONE (DELTASONE) tablet 10 mg (not administered)  aspirin EC tablet 81 mg (not administered)  nitroGLYCERIN (NITROSTAT) SL tablet 0.4 mg (not administered)  acetaminophen (TYLENOL) tablet 650 mg (not administered)  ondansetron (ZOFRAN) injection 4 mg (not administered)  heparin injection 5,000 Units (5,000 Units Subcutaneous Given 06/17/16 2226)  budesonide (PULMICORT) nebulizer solution 0.5 mg (not administered)  aspirin chewable tablet 324 mg (324 mg Oral Given 06/17/16 2225)    Or  aspirin suppository 300 mg ( Rectal See Alternative 06/17/16 2225)     Initial Impression / Assessment and Plan / ED Course  I have reviewed the triage vital signs and the nursing notes.  Pertinent labs & imaging results that were available during my care of the patient were reviewed by me and considered in my medical decision making (see chart for details).  Clinical Course   Whitney Evans is a 70 y.o. female with h/o asthma who presents to ED for shortness of breath and fatigue, some coughing, that pt associates with asthma flare x 1.5 weeks. No associated fevers. Pt denies chest pain, but endorses diffuse chest tightness that she attributes to asthma flare. New b/l LE edema. On exam, rales throughout without wheezing, found to be in complete heart block, concern for new-onset heart failure, likely related to new heart block. BNP elevated to 1000. troponinemia to 0.06, likely demand ischemia 2/2 decreased coronary perfusion in setting of bradycardia. CXR  c/w pulmonary vascular congestion. Pt mentating well with normal Bp, admitted to cardiology services for further management.   Pt condition, course, and admission were discussed with attending physician Dr. Eber Hong.  Final Clinical Impressions(s) / ED Diagnoses   Final diagnoses:  Complete heart block [facility])    New Prescriptions Current Discharge Medication List       Horald Pollen, MD 06/18/16 1610    Eber Hong, MD 06/20/16 0100

## 2016-06-17 NOTE — H&P (Signed)
Patient ID: JENSYN SHAVE MRN: [id number], DOB/AGE: 12/28/1945   Admit date: 06/17/2016  Primary Physician: Nelwyn Salisbury, MD (changed PCP)  Pt. Profile:  Mrs. Horvath is a pleasant 70 year old female with past medical history of hypertension, GERD, hypothyroidism and history of asthma presented after a fall/syncope yesterday, recent asthma exacerbation and noted to be in 3rd degree heart block  Problem List  Past Medical History:  Diagnosis Date  . Allergic rhinitis   . Asthma   . Chronic hoarseness    sees Dr. [name] (ENT) in [place]   . Essential hypertension   . GERD (gastroesophageal reflux disease)   . Hypothyroidism   . Insomnia   . Neck pain, chronic     Past Surgical History:  Procedure Laterality Date  . CESAREAN SECTION     x 2  . COLONOSCOPY  05/24/07   per Dr. Russella Dar, benign polpys, and diverticulosis, repeat 5 years   . EYE SURGERY    . left foot surgery    . VESICOVAGINAL FISTULA CLOSURE W/ TAH    . WISDOM TOOTH EXTRACTION       Allergies  Allergies  Allergen Reactions  . Morphine Sulfate Hives and Shortness Of Breath  . Pneumococcal Vaccine Polyvalent Shortness Of Breath and Swelling    Skin peeled off    HPI  Mrs. Hine is a pleasant 70 year old female with past medical history of hypertension, GERD, hypothyroidism and history of asthma. She drinks wine occasionally, she only smoked as a teenager and has not smoked ever since. Otherwise, she has no significant cardiac issues and has never seen a cardiologist. Her daughter is a cardiac ICU nurse. Recently, she has been troubled by asthma exacerbation for the past week. She has increasing shortness of breath. Yesterday, while brushing her teeth in front of the sink, she had worsening shortness of breath and as well as a coughing spell, subsequently passed out. She woke up on the floor does not remember falling down. She did hit the side of her of her left foot on the drawer and also has  some bruises on her back. She went to the urgent care will gave her a steroid shot and put her on a seven-day course of steroids. Prior to yesterday, she denies any significant dizziness or passing out spells. She denies any recent lower extremity edema, orthopnea or paroxysmal nocturnal dyspnea.   She went to see her new PCP today on 06/17/2016, the EKG obtained today showed third degree heart block, she was subsequently transferred to [facility] for further workup. Case was discussed with Dr. Johney Frame. Recent lab work obtained finding TSH, T4 and T3 within normal range. Otherwise, other than steroid and the nebulizer treatments, she has not had any new medications and has not been on any AV node blockers. She denies any prior passing out spells. Her heart rate is in the high 30s, telemetry showed complete heart block. Otherwise her blood pressure is stable in the 150s. She does have mildly elevated troponin of 0.06, her creatinine is also mildly elevated at 1.2. White blood cell count was 16.3 consistent with recent steroid use.    Home Medications  Prior to Admission medications   Medication Sig Start Date End Date Taking? Authorizing Provider  albuterol (PROAIR HFA) 108 (90 Base) MCG/ACT inhaler Inhale 2 puffs into the lungs every 4 (four) hours as needed. 09/23/15  Yes Nelwyn Salisbury, MD  beclomethasone (QVAR) 80 MCG/ACT inhaler Inhale 2 puffs into the lungs 2 (  two) times daily. Only uses in the fall time 09/23/15  Yes Nelwyn SalisburyStephen A Fry, MD  Cholecalciferol (VITAMIN D3) 10000 units capsule Take 10,000 Units by mouth daily.   Yes Historical Provider, MD  ipratropium-albuterol (DUONEB) 0.5-2.5 (3) MG/3ML SOLN Take 3 mLs by nebulization every 4 (four) hours as needed (asthma).   Yes Historical Provider, MD  levothyroxine (SYNTHROID, LEVOTHROID) 75 MCG tablet Take 1 tablet (75 mcg total) by mouth daily. Patient taking differently: Take 75 mcg by mouth daily before breakfast.  09/23/15  Yes Nelwyn SalisburyStephen A  Fry, MD  montelukast (SINGULAIR) 10 MG tablet Take 10 mg by mouth daily. 06/16/16  Yes Historical Provider, MD  Multiple Vitamin (MULTIVITAMIN WITH MINERALS) TABS tablet Take 1 tablet by mouth daily.   Yes Historical Provider, MD  predniSONE (STERAPRED UNI-PAK 21 TAB) 10 MG (21) TBPK tablet Take 1 tablet by mouth as directed. 06/16/16  Yes Historical Provider, MD    Family History  Family History  Problem Relation Age of Onset  . Coronary artery disease Other   . Diabetes Other   . Hyperlipidemia Other   . Hypertension Other   . Stroke Other   . Stroke Mother   . Heart attack Father [id number]    Social History  Social History   Social History  . Marital status: Divorced    Spouse name: N/A  . Number of children: N/A  . Years of education: N/A   Occupational History  . Not on file.   Social History Main Topics  . Smoking status: Former Games developermoker  . Smokeless tobacco: Never Used  . Alcohol use 1.2 oz/week    2 Standard drinks or equivalent per week     Comment: glass of wine  . Drug use: No  . Sexual activity: Not on file   Other Topics Concern  . Not on file   Social History Narrative  . No narrative on file     Review of Systems General:  No chills, fever, night sweats or weight changes.  Cardiovascular:  No chest pain, edema, orthopnea, palpitations, paroxysmal nocturnal dyspnea. +dyspnea Dermatological: No rash, lesions/masses Respiratory: +cough, dyspnea Urologic: No hematuria, dysuria Abdominal:   No nausea, vomiting, diarrhea, bright red blood per rectum, melena, or hematemesis Neurologic:  No visual changes +syncope yesterday, wkns All other systems reviewed and are otherwise negative except as noted above.  Physical Exam  Blood pressure 161/59, pulse (!) 35, temperature 98.2 F (36.8 C), resp. rate 17, height 5\' 5"  (1.651 m), weight 209 lb (94.8 kg), SpO2 93 %.  General: Pleasant, NAD Psych: Normal affect. Neuro: Alert and oriented X 3. Moves all  extremities spontaneously. HEENT: Normal  Neck: Supple without bruits or JVD. Lungs:  Resp regular and unlabored. Bibasilar rhonchi and crackles. Heart: bradycardiac. no s3, s4, or murmurs. Abdomen: Soft, non-tender, non-distended, BS + x 4.  Extremities: No clubbing, cyanosis or edema. DP/PT/Radials 2+ and equal bilaterally.  Labs  Troponin [facility](Point of Care Test)  Recent Labs  06/17/16 1916  TROPIPOC 0.08    Recent Labs  06/17/16 1750  TROPONINI 0.06*   Lab Results  Component Value Date   WBC 16.3 (H) 06/17/2016   HGB 12.6 06/17/2016   HCT 37.6 06/17/2016   MCV 86.8 06/17/2016   PLT 322 06/17/2016     Recent Labs Lab 06/17/16 1750  NA 132*  K 4.5  CL 105  CO2 15*  BUN 19  CREATININE 1.20*  CALCIUM 9.2  GLUCOSE 145*   Lab Results  Component Value Date   CHOL 226 (H) 09/23/2015   HDL 61.20 09/23/2015   LDLCALC 140 (H) 09/23/2015   TRIG 124.0 09/23/2015   No results found for: DDIMER   Radiology/Studies  No results found.  ECG  Complete A-V dissociation with occasional PVCs.  Echocardiogram  Pending echocardiogram     ASSESSMENT AND PLAN  1. Complete heart block: She is not on any AV nodal blocking agent, her free T4 and T3 actually normal. She is currently on board for possible pacemaker placement by Dr. Elberta Fortis, however family also is considering to have the pacemaker placed at [facility].  - For now, given the fact the patient is hemodynamically stable, we will place transcutaneous pacing pads on her in case she decompensate overnight. It does not appear patient needed temporary pacing wire at this time. Will continue to monitor overnight for possible pacemaker either here or at [facility] tomorrow. Will defer to Dr. Elberta Fortis is discussed with the patient and her family.  - Will continue to trend troponin, obtain echocardiogram.  2. Syncope: Unclear if her syncope yesterday was caused by shortness of breath associated with asthma exacerbation versus complete  heart block.   3. Asthma exacerbation: Continue with steroid taper. She does have bibasilar crackles and rhonchi, pending chest x-ray.  4. Left ankle pain: she was able to rotate the left ankle without significant discomfort, however if left ankle pain worsens, plan to obtain ankle x-ray to rule out potential fracture. No current evidence of fracture based on physical exam.  5. Hypothyroidism: She is under the impression that her T3 is low, however on review of records, her T3 is actually normal.   Signed, Azalee Course, PA-C 06/17/2016, 8:07 PM  Attending note:  Patient seen and examined. Reviewed available records and discussed the case with Mr. Eyvonne Left. Patient reports having recent flare of asthma, specifically shortness of breath and wheezing. Yesterday while brushing her teeth in the morning she states that she developed a coughing spell and suddenly passed out as discussed above. She was seen subsequently at urgent care with initiation of steroids for treatment of asthma exacerbation (not certain what heart rate was at that visit). Today she presented to see her new PCP, Ms. Dayton Scrape NP ([facility]) and was found to be bradycardic with ECG showing complete heart block. Case was discussed with Dr. Johney Frame by phone who recommended transfer to the ER for hospital admission and consideration of pacemaker placement by Dr. Elberta Fortis on October 12.  Patient does not report any previous episodes of syncope, no palpitations or known arrhythmias. She is not on any AV nodal blockers. Has used some natural supplements, details are not clear. No known tick borne illness. ECG showed sinus tachycardia with complete heart block associated with narrow complex escape and PVCs, heart rate in the 50s.  On examination she appears comfortable, systolic blood pressure is in the 150s to 160s. Lungs exhibit decreased breath sounds with prolonged expiratory phase but no active wheezing.  Cardiac exam reveals a slow, largely regular rhythm without gallop. Pulses are full, no peripheral edema. Lab work shows BNP 1037, troponin I 0.06, that ABC 16.3, hemoglobin 12.6, platelets 322, creatinine 1.2, potassium 4.5. She did have recent outpatient lab work which I reviewed as well. She feels as if her T3 is "too low" however TSH, T4, and T3 levels were within normal laboratory range.  Patient presents with complete heart block, presumably recent onset and possibly associated with the episode of  syncope that occurred yesterday. She is hemodynamic stable otherwise and in no distress at this time. No AV nodal blockers, no obvious tick borne illness, recent thyroid studies normal range. Plan is admission to the ICU, pacer pads in place in case needed, atropine available. No clear indication for temporary pacing wire at this time. Dr. Johney Frame was already made aware of this patient's transfer to the ER, and she will be kept nothing by mouth for possible pacemaker placement tomorrow with Dr. Elberta Fortis. She needs an echocardiogram as well to assess cardiac structure and function. Of note, patient and family members (daughter is a Engineer, civil (consulting)) did raise the possibility that they may want to have her transferred to [facility] for pacemaker since they have other family members associated with that facility.  Jonelle Sidle, M.D., F.A.C.C.

## 2016-06-18 ENCOUNTER — Inpatient Hospital Stay (HOSPITAL_COMMUNITY): Payer: PPO

## 2016-06-18 ENCOUNTER — Encounter (HOSPITAL_COMMUNITY)
Admission: EM | Disposition: A | Discharge status: Home / Self Care | Payer: Self-pay | Source: Home / Self Care | Attending: Cardiology

## 2016-06-18 DIAGNOSIS — E039 Hypothyroidism, unspecified: Secondary | ICD-10-CM | POA: Diagnosis not present

## 2016-06-18 DIAGNOSIS — I5089 Other heart failure: Secondary | ICD-10-CM | POA: Diagnosis not present

## 2016-06-18 DIAGNOSIS — R490 Dysphonia: Secondary | ICD-10-CM | POA: Diagnosis not present

## 2016-06-18 DIAGNOSIS — Z87891 Personal history of nicotine dependence: Secondary | ICD-10-CM | POA: Diagnosis not present

## 2016-06-18 DIAGNOSIS — K219 Gastro-esophageal reflux disease without esophagitis: Secondary | ICD-10-CM | POA: Diagnosis not present

## 2016-06-18 DIAGNOSIS — I455 Other specified heart block: Secondary | ICD-10-CM

## 2016-06-18 DIAGNOSIS — I10 Essential (primary) hypertension: Secondary | ICD-10-CM | POA: Diagnosis not present

## 2016-06-18 DIAGNOSIS — I442 Atrioventricular block, complete: Secondary | ICD-10-CM | POA: Diagnosis not present

## 2016-06-18 DIAGNOSIS — D72829 Elevated white blood cell count, unspecified: Secondary | ICD-10-CM | POA: Diagnosis not present

## 2016-06-18 DIAGNOSIS — T380X5A Adverse effect of glucocorticoids and synthetic analogues, initial encounter: Secondary | ICD-10-CM | POA: Diagnosis not present

## 2016-06-18 DIAGNOSIS — J45901 Unspecified asthma with (acute) exacerbation: Secondary | ICD-10-CM | POA: Diagnosis not present

## 2016-06-18 DIAGNOSIS — Z7951 Long term (current) use of inhaled steroids: Secondary | ICD-10-CM | POA: Diagnosis not present

## 2016-06-18 DIAGNOSIS — Z8249 Family history of ischemic heart disease and other diseases of the circulatory system: Secondary | ICD-10-CM | POA: Diagnosis not present

## 2016-06-18 HISTORY — PX: EP IMPLANTABLE DEVICE: SHX172B

## 2016-06-18 LAB — BASIC METABOLIC PANEL
Anion gap: 8 (ref 5–15)
BUN: 18 mg/dL (ref 6–20)
CALCIUM: 9.2 mg/dL (ref 8.9–10.3)
CO2: 22 mmol/L (ref 22–32)
Chloride: 108 mmol/L (ref 101–111)
Creatinine, Ser: 1.03 mg/dL — ABNORMAL HIGH (ref 0.44–1.00)
GFR calc Af Amer: >60 mL/min (ref >60)
GFR, EST NON AFRICAN AMERICAN: 55 mL/min — AB (ref >60)
GLUCOSE: 125 mg/dL — AB (ref 65–99)
Potassium: 5.1 mmol/L (ref 3.5–5.1)
Sodium: 138 mmol/L (ref 135–145)

## 2016-06-18 LAB — TSH: TSH: 2 u[IU]/mL (ref 0.350–4.500)

## 2016-06-18 LAB — ECHOCARDIOGRAM COMPLETE
FS: 44 % (ref 28–44)
HEIGHTINCHES: 65 in
IVS/LV PW RATIO, ED: 0.96
LA diam end sys: 42 mm
LA diam index: 1.96 cm/m2
LA vol A4C: 66.9 ml
LA vol index: 30.1 mL/m2
LASIZE: 42 mm
LAVOL: 64.6 mL
LVOT area: 3.8 cm2
LVOT diameter: 22 mm
PW: 9.89 mm — AB (ref 0.6–1.1)
WEIGHTICAEL: 3403.9 [oz_av]

## 2016-06-18 LAB — TROPONIN I
Troponin I: 0.07 ng/mL (ref <0.03)
Troponin I: 0.06 ng/mL (ref <0.03)

## 2016-06-18 LAB — MRSA PCR SCREENING: MRSA by PCR: NEGATIVE

## 2016-06-18 SURGERY — PACEMAKER IMPLANT

## 2016-06-18 MED ORDER — FENTANYL CITRATE (PF) 100 MCG/2ML IJ SOLN
INTRAMUSCULAR | Status: AC
  Filled 2016-06-18: qty 2

## 2016-06-18 MED ORDER — CEFAZOLIN SODIUM-DEXTROSE 2-4 GM/100ML-% IV SOLN
INTRAVENOUS | Status: AC
  Filled 2016-06-18: qty 100

## 2016-06-18 MED ORDER — SODIUM CHLORIDE 0.9% FLUSH: 3.0000 mL | INTRAVENOUS | Status: DC | PRN

## 2016-06-18 MED ORDER — CHLORHEXIDINE GLUCONATE 4 % EX LIQD
CUTANEOUS | Status: AC
  Filled 2016-06-18: qty 15

## 2016-06-18 MED ORDER — CEFAZOLIN SODIUM-DEXTROSE 2-4 GM/100ML-% IV SOLN
2.0000 g | INTRAVENOUS | Status: DC
  Filled 2016-06-18: qty 100

## 2016-06-18 MED ORDER — HYDRALAZINE HCL 20 MG/ML IJ SOLN
INTRAMUSCULAR | Status: AC
  Filled 2016-06-18: qty 1

## 2016-06-18 MED ORDER — LIDOCAINE HCL (PF) 1 % IJ SOLN
INTRAMUSCULAR | Status: AC
  Filled 2016-06-18: qty 60

## 2016-06-18 MED ORDER — IOPAMIDOL (ISOVUE-370) INJECTION 76%
INTRAVENOUS | Status: DC | PRN
  Administered 2016-06-18: 10 mL via INTRAVENOUS

## 2016-06-18 MED ORDER — GENTAMICIN SULFATE 40 MG/ML IJ SOLN
INTRAMUSCULAR | Status: AC
  Filled 2016-06-18: qty 2

## 2016-06-18 MED ORDER — LIDOCAINE HCL (PF) 1 % IJ SOLN
INTRAMUSCULAR | Status: DC | PRN
  Administered 2016-06-18: 60 mL

## 2016-06-18 MED ORDER — CEFAZOLIN IN D5W 1 GM/50ML IV SOLN
1.0000 g | Freq: Four times a day (QID) | INTRAVENOUS | Status: AC
  Administered 2016-06-18 – 2016-06-19 (×3): 1 g via INTRAVENOUS
  Filled 2016-06-18 (×3): qty 50

## 2016-06-18 MED ORDER — SODIUM CHLORIDE 0.9 % IV SOLN
INTRAVENOUS | Status: DC
  Administered 2016-06-18: 09:00:00 via INTRAVENOUS

## 2016-06-18 MED ORDER — LIDOCAINE HCL (PF) 1 % IJ SOLN
INTRAMUSCULAR | Status: AC
  Filled 2016-06-18: qty 30

## 2016-06-18 MED ORDER — SODIUM CHLORIDE 0.9 % IR SOLN
80.0000 mg | Status: DC
  Filled 2016-06-18: qty 2

## 2016-06-18 MED ORDER — HYDRALAZINE HCL 20 MG/ML IJ SOLN
10.0000 mg | Freq: Once | INTRAMUSCULAR | Status: AC
  Administered 2016-06-18: 10 mg via INTRAVENOUS

## 2016-06-18 MED ORDER — CEFAZOLIN SODIUM-DEXTROSE 2-3 GM-% IV SOLR
INTRAVENOUS | Status: DC | PRN
  Administered 2016-06-18: 2 g via INTRAVENOUS

## 2016-06-18 MED ORDER — SODIUM CHLORIDE 0.9 % IV SOLN: 250.0000 mL | INTRAVENOUS | Status: DC

## 2016-06-18 MED ORDER — CHLORHEXIDINE GLUCONATE 4 % EX LIQD
60.0000 mL | Freq: Once | CUTANEOUS | Status: AC
  Administered 2016-06-18: 4 via TOPICAL

## 2016-06-18 MED ORDER — IOPAMIDOL (ISOVUE-370) INJECTION 76%
INTRAVENOUS | Status: AC
  Filled 2016-06-18: qty 50

## 2016-06-18 MED ORDER — HEPARIN (PORCINE) IN NACL 2-0.9 UNIT/ML-% IJ SOLN
INTRAMUSCULAR | Status: AC
  Filled 2016-06-18: qty 500

## 2016-06-18 MED ORDER — MIDAZOLAM HCL 5 MG/5ML IJ SOLN
INTRAMUSCULAR | Status: AC
  Filled 2016-06-18: qty 5

## 2016-06-18 MED ORDER — SODIUM CHLORIDE 0.9% FLUSH
3.0000 mL | Freq: Two times a day (BID) | INTRAVENOUS | Status: DC
  Administered 2016-06-18: 3 mL via INTRAVENOUS

## 2016-06-18 MED ORDER — CHLORHEXIDINE GLUCONATE 4 % EX LIQD
60.0000 mL | Freq: Once | CUTANEOUS | Status: AC
  Administered 2016-06-18: 4 via TOPICAL
  Filled 2016-06-18: qty 60

## 2016-06-18 SURGICAL SUPPLY — 8 items
CABLE SURGICAL S-101-97-12 (CABLE) ×3 IMPLANT
HEMOSTAT SURGICEL 2X4 FIBR (HEMOSTASIS) ×3 IMPLANT
LEAD TENDRIL MRI 52CM LPA1200M (Lead) ×3 IMPLANT
LEAD TENDRIL MRI 58CM LPA1200M (Lead) ×3 IMPLANT
PACEMAKER ASSURITY DR-RF (Pacemaker) ×3 IMPLANT
PAD DEFIB LIFELINK (PAD) ×3 IMPLANT
SHEATH CLASSIC 8F (SHEATH) ×6 IMPLANT
TRAY PACEMAKER INSERTION (PACKS) ×3 IMPLANT

## 2016-06-18 NOTE — Discharge Summary (Addendum)
Marland Kitchen.     ELECTROPHYSIOLOGY PROCEDURE DISCHARGE SUMMARY    Patient ID: Whitney Evans,  MRN: [id number], DOB/AGE: March 12, 1946 70 y.o.  Admit date: 06/17/2016 Discharge date: 06/19/16  Primary Care Physician: PROVIDER NOT IN SYSTEM  Primary Cardiologist: new to [facility], [facility] Electrophysiologist: New, Whitney Evans  Primary Discharge Diagnosis:  1. Syncope 2. CHB     S/p PPM implant  Secondary Discharge Diagnosis:  1. HTN 2. Hypothyroidism 3. GERD 4. Asthma  Allergies  Allergen Reactions  . Morphine Sulfate Hives and Shortness Of Breath  . Pneumococcal Vaccine Polyvalent Shortness Of Breath and Swelling    Skin peeled off     Procedures This Admission:  1.  Implantation of aSTJ dual chamber PPM on 06/18/16 by Dr Whitney Evans.  The patient received a  Whitney Evans model [id number] (serial number  [id number] ) pacemaker, St Whitney Evans model [id number] (serial number  [id number]) right atrial lead and a St Whitney Evans model [id number] (serial number  [id number]) right ventricular lead.  There were no immediate post procedure complications. 2.  CXR on 06/19/16 demonstrated no pneumothorax status post device implantation.   Brief HPI: Whitney Evans is a 70 y.o. female was admitted to [facility] after a syncopal event, found in CHB with rates 30's.  [facility]ans Course:  The patient was admitted to ICU, no reversible causes identified, an echo noted LVEF 60-65%, mod MR, mild LAE, mild TR,  and underwent implantation of a PPM with details as outlined above.  She was monitored on telemetry overnight which demonstrated SR, V pacing.  Left chest was without hematoma, small area of ecchymosis laterally.  The device was interrogated and found to be functioning normally.  CXR was obtained and demonstrated no pneumothorax status post device implantation. Her SOB is resolved post PPM, no exam evidence of fluid OL.   Wound care, arm mobility, and restrictions were reviewed with the patient.   The patient was examined by Whitney Evans and considered stable for discharge to home.   She is on a medrol dose pack, received her dose for today here, instructed to take her next dose tomorrow at the 30mg  dose and finish/taper as directed on the pack.  She is instructed to monitor her BP at home as she completes her steroid and if remains elevated to follow up with her PMD or let us know.   Physical Exam: Vitals:   06/18/16 2122 06/19/16 0511 06/19/16 0528 06/19/16 0722  BP:  (!) 179/88    Pulse: 89 78    Resp: 18 18 20    Temp:  97.9 F (36.6 C)    TempSrc:  Oral    SpO2: 96% 95% 94% 96%  Weight:      Height:        GEN- The patient is well appearing, alert and oriented x 3 today.   HEENT: normocephalic, atraumatic; sclera clear, conjunctiva pink; hearing intact; oropharynx clear; neck supple, no JVP Lungs- Clear to ausculation bilaterally, normal work of breathing.  No wheezes, rales, rhonchi Heart- Regular rate and rhythm, no murmurs, rubs or gallops, PMI not laterally displaced GI- soft, non-tender, non-distended Extremities- no clubbing, cyanosis, or edema MS- no significant deformity or atrophy Skin- warm and dry, no rash or lesion, left chest without hematoma/ecchymosis Psych- euthymic mood, full affect Neuro- no gross deficits   Labs:   Lab Results  Component Value Date   WBC 16.3 (H) 06/17/2016   HGB 12.6 06/17/2016   HCT 37.6 06/17/2016  MCV 86.8 06/17/2016   PLT 322 06/17/2016     Recent Labs Lab 06/18/16 0348  NA 138  K 5.1  CL 108  CO2 22  BUN 18  CREATININE 1.03*  CALCIUM 9.2  GLUCOSE 125*    Discharge Medications:    Medication List    TAKE these medications   albuterol 108 (90 Base) MCG/ACT inhaler Commonly known as:  PROAIR HFA Inhale 2 puffs into the lungs every 4 (four) hours as needed.   beclomethasone 80 MCG/ACT inhaler Commonly known as:  QVAR Inhale 2 puffs into the lungs 2 (two) times daily. Only uses in the fall time     ipratropium-albuterol 0.5-2.5 (3) MG/3ML Soln Commonly known as:  DUONEB Take 3 mLs by nebulization every 4 (four) hours as needed (asthma).   levothyroxine 75 MCG tablet Commonly known as:  SYNTHROID, LEVOTHROID Take 1 tablet (75 mcg total) by mouth daily. What changed:  when to take this   montelukast 10 MG tablet Commonly known as:  SINGULAIR Take 10 mg by mouth daily.   multivitamin with minerals Tabs tablet Take 1 tablet by mouth daily.   predniSONE 10 MG (21) Tbpk tablet Commonly known as:  STERAPRED UNI-PAK 21 TAB Take 1 tablet by mouth as directed. Notes to patient:  You received your dose of 40mg  in the [facility] today, resume tomorrow at 30mg  dose and complete as directed from there.   Vitamin D3 10000 units capsule Take 10,000 Units by mouth daily.       Disposition:  Home Discharge Instructions    Diet - low sodium heart healthy    Complete by:  As directed    Increase activity slowly    Complete by:  As directed      Follow-up Information    [facility] Follow up on 06/29/2016.   Specialty:  Cardiology Why:  12:00PM, (noon), wound check Contact information: [address] [place] [address] [phone]       Whitney Gittens [name], MD Follow up on 09/21/2016.   Specialty:  Cardiology Why:  10:30AM Contact information: [address] [place] [address] [phone]           Duration of Discharge Encounter: Greater than 30 minutes including physician time.  Whitney Fredrickson, PA-C 06/19/2016 8:57 AM  I have seen and examined this patient with Whitney Evans.  Agree with above, note added to reflect my findings.  On exam, regula rhythm, no murmurs, lungs clear. Presented to the [facility] in heart block with mildly elevated troponin possibly due to demand of bradycardia. TTE showed normal EF, unable to evaluate for diastolic dysfunction, and thus dual chamber pacemaker placed.  No  issues with interrogation or AM CXR. Plan for follow up in EP clinic in 10 days.    Antonious Omahoney M. Saeed Toren MD 06/19/2016 11:05 AM

## 2016-06-18 NOTE — Consult Note (Signed)
ELECTROPHYSIOLOGY CONSULT NOTE    Patient ID: Whitney Evans MRN: [id number], DOB/AGE: 1945-12-31 70 y.o.  Admit date: 06/17/2016 Date of Consult: Whitney Evans is a 70 y.o. female   Primary Physician: PROVIDER NOT IN SYSTEM Primary Cardiologist: None, new to [facility]  Requesting MD: Dr. Diona Browner  Reason for Consultation: CHB  HPI: Whitney Evans is a 70 y.o. female with PMHx of HTN GERD, hypothyroidism, and asthma admitted after a syncopal event,  Noted in CHB.  The patient has had significant asthma symptoms of late seen recently in an [facility] and started on steroid dose pack.  She was in her BR brushing her teeth suddenly found herself on the floor.  She denies trauma or injury.  She had no c/o CP or palpitations.  Outside of the steroid no new medications or changes of late.  She reported feeling uusualy fatigued fr a couple days otherwise no other symptoms.    LABS: K+ 5.1 BUN/Creat 18/1.03 Trop I:  0.06, 0.07, 0.07 H/H 12/37 WBC 16.3 plts 322  TSH- pt refused new blood draw, TSH in [name] was OK  Echo is pending  Past Medical History:  Diagnosis Date  . Allergic rhinitis   . Asthma   . Chronic hoarseness    sees Dr. [name] (ENT) in [place]   . Essential hypertension   . GERD (gastroesophageal reflux disease)   . Hypothyroidism   . Insomnia   . Neck pain, chronic      Surgical History:  Past Surgical History:  Procedure Laterality Date  . CESAREAN SECTION     x 2  . COLONOSCOPY  05/24/07   per Dr. Russella Dar, benign polpys, and diverticulosis, repeat 5 years   . EYE SURGERY    . left foot surgery    . VESICOVAGINAL FISTULA CLOSURE W/ TAH    . WISDOM TOOTH EXTRACTION       Prescriptions Prior to Admission  Medication Sig Dispense Refill Last Dose  . albuterol (PROAIR HFA) 108 (90 Base) MCG/ACT inhaler Inhale 2 puffs into the lungs every 4 (four) hours as needed. 3 Inhaler 3 06/17/2016 at 10am  . beclomethasone (QVAR) 80 MCG/ACT inhaler Inhale 2 puffs  into the lungs 2 (two) times daily. Only uses in the fall time 3 Inhaler 3 06/17/2016 at Unknown time  . Cholecalciferol (VITAMIN D3) 10000 units capsule Take 10,000 Units by mouth daily.   06/16/2016 at Unknown time  . ipratropium-albuterol (DUONEB) 0.5-2.5 (3) MG/3ML SOLN Take 3 mLs by nebulization every 4 (four) hours as needed (asthma).   06/17/2016 at Unknown time  . levothyroxine (SYNTHROID, LEVOTHROID) 75 MCG tablet Take 1 tablet (75 mcg total) by mouth daily. (Patient taking differently: Take 75 mcg by mouth daily before breakfast. ) 90 tablet 3 06/17/2016 at Unknown time  . montelukast (SINGULAIR) 10 MG tablet Take 10 mg by mouth daily.     . Multiple Vitamin (MULTIVITAMIN WITH MINERALS) TABS tablet Take 1 tablet by mouth daily.   06/17/2016 at Unknown time  . predniSONE (STERAPRED UNI-PAK 21 TAB) 10 MG (21) TBPK tablet Take 1 tablet by mouth as directed.       Inpatient Medications:  . aspirin EC  81 mg Oral Daily  . budesonide (PULMICORT) nebulizer solution  0.5 mg Nebulization BID  . cholecalciferol  1,000 Units Oral Daily  . heparin  5,000 Units Subcutaneous Q8H  . levothyroxine  75 mcg Oral QAC breakfast  . montelukast  10 mg Oral Daily  . multivitamin with  minerals  1 tablet Oral Daily  . predniSONE  50 mg Oral Q breakfast   Followed by  . [START ON 06/19/2016] predniSONE  40 mg Oral Q breakfast   Followed by  . [START ON 06/20/2016] predniSONE  30 mg Oral Q breakfast   Followed by  . [START ON 06/21/2016] predniSONE  20 mg Oral Q breakfast   Followed by  . [START ON 06/22/2016] predniSONE  10 mg Oral Q breakfast    Allergies:  Allergies  Allergen Reactions  . Morphine Sulfate Hives and Shortness Of Breath  . Pneumococcal Vaccine Polyvalent Shortness Of Breath and Swelling    Skin peeled off    Social History   Social History  . Marital status: Divorced    Spouse name: N/A  . Number of children: N/A  . Years of education: N/A   Occupational History  . Not  on file.   Social History Main Topics  . Smoking status: Former Games developer  . Smokeless tobacco: Never Used  . Alcohol use 1.2 oz/week    2 Standard drinks or equivalent per week     Comment: glass of wine  . Drug use: No  . Sexual activity: Not on file   Other Topics Concern  . Not on file   Social History Narrative  . No narrative on file     Family History  Problem Relation Age of Onset  . Coronary artery disease Other   . Diabetes Other   . Hyperlipidemia Other   . Hypertension Other   . Stroke Other   . Stroke Mother   . Heart attack Father 53     Review of Systems: All other systems reviewed and are otherwise negative except as noted above.  Physical Exam: Vitals:   06/18/16 0400 06/18/16 0510 06/18/16 0600 06/18/16 0700  BP: (!) 166/71 (!) 152/69 (!) 164/91 (!) 176/66  Pulse: (!) 29 (!) 30 (!) 25 (!) 31  Resp: 16 (!) 21 20 17   Temp:      TempSrc:      SpO2: 92% 93% 91% 96%  Weight:      Height:        GEN- The patient is well appearing, alert and oriented x 3 today.   HEENT: normocephalic, atraumatic; sclera clear, conjunctiva pink; hearing intact; oropharynx clear; neck supple, no JVP Lymph- no cervical lymphadenopathy Lungs- Clear to ausculation bilaterally, normal work of breathing.  No active wheezes, no rales, rhonchi Heart- Regular rate and rhythm, bradycardic, no murmurs, rubs or gallops, PMI not laterally displaced GI- soft, non-tender, non-distended Extremities- no clubbing, cyanosis, or edema MS- no significant deformity or atrophy Skin- warm and dry, no rash or lesion Psych- euthymic mood, full affect Neuro- no gross deficits observed  Labs:   Lab Results  Component Value Date   WBC 16.3 (H) 06/17/2016   HGB 12.6 06/17/2016   HCT 37.6 06/17/2016   MCV 86.8 06/17/2016   PLT 322 06/17/2016    Recent Labs Lab 06/18/16 0348  NA 138  K 5.1  CL 108  CO2 22  BUN 18  CREATININE 1.03*  CALCIUM 9.2  GLUCOSE 125*        Radiology/Studies:  Dg Chest Portable 1 View Result Date: 06/17/2016 CLINICAL DATA:  Shortness of breath since yesterday.  Chest pain. EXAM: PORTABLE CHEST 1 VIEW COMPARISON:  11/07/2003 FINDINGS: Low volume chest with diffuse interstitial opacity. Borderline cardiomegaly; vascular pedicle widening. No effusion or pneumothorax. IMPRESSION: Probable mild CHF.  Findings are accentuated  by hypoventilation. Electronically Signed   By: Marnee SpringJonathon  Watts M.D.   On: 06/17/2016 22:12    EKG:  CHB, #2 with IVCD escape rhythm TELEMETRY: CHB 28-30's, narrow and wide QRS morphology noted   Assessment and Plan:   1. Syncope, CHB     No reversible causes noted, no nodal blocking agents in home meds     Echo is pending     Dr. Elberta Fortisamnitz discussed PPM with the patient/daughter at bedside, risks/benefits, the patient wants to proceed  2. HTN     BP stable  3. CHF     Likely secondary to CHB     Follow after pacing     Echo is pending  4. Asthma     Recent exacerbation, no active wheezing  5. Leukocytosis     Afebrile, no symptoms of illness, likely secondary to steroid        Signed, Francis DowseRenee Ursuy, PA-C 06/18/2016 7:25 AM  I have seen and examined this patient with Francis Dowseenee Ursuy.  Agree with above, note added to reflect my findings.  On exam, bradycardic, no murmurs, lungs clear.  Presented to the hospital in heart block.  Complains of fatigue and weakness.  No reversible causes found.  Plan for pacemaker.  Risks and benefits discussed.  Risks include but not limited to bleeding infection, tamponade, pneumothorax.  She understnands the risks and has agreed to the procedure.  Hanni Milford M. Venancio Chenier MD 06/18/2016 11:29 AM

## 2016-06-18 NOTE — Progress Notes (Signed)
  Echocardiogram 2D Echocardiogram has been performed.  Whitney Evans, Whitney Evans 06/18/2016, 8:56 AM

## 2016-06-19 ENCOUNTER — Inpatient Hospital Stay (HOSPITAL_COMMUNITY): Payer: PPO

## 2016-06-19 ENCOUNTER — Encounter (HOSPITAL_COMMUNITY): Payer: Self-pay | Admitting: Cardiology

## 2016-06-19 DIAGNOSIS — E039 Hypothyroidism, unspecified: Secondary | ICD-10-CM | POA: Diagnosis not present

## 2016-06-19 DIAGNOSIS — Z95810 Presence of automatic (implantable) cardiac defibrillator: Secondary | ICD-10-CM | POA: Diagnosis not present

## 2016-06-19 DIAGNOSIS — K219 Gastro-esophageal reflux disease without esophagitis: Secondary | ICD-10-CM | POA: Diagnosis not present

## 2016-06-19 DIAGNOSIS — Z87891 Personal history of nicotine dependence: Secondary | ICD-10-CM | POA: Diagnosis not present

## 2016-06-19 DIAGNOSIS — I5089 Other heart failure: Secondary | ICD-10-CM | POA: Diagnosis not present

## 2016-06-19 DIAGNOSIS — Z7951 Long term (current) use of inhaled steroids: Secondary | ICD-10-CM | POA: Diagnosis not present

## 2016-06-19 DIAGNOSIS — T380X5A Adverse effect of glucocorticoids and synthetic analogues, initial encounter: Secondary | ICD-10-CM | POA: Diagnosis not present

## 2016-06-19 DIAGNOSIS — D72829 Elevated white blood cell count, unspecified: Secondary | ICD-10-CM | POA: Diagnosis not present

## 2016-06-19 DIAGNOSIS — I10 Essential (primary) hypertension: Secondary | ICD-10-CM | POA: Diagnosis not present

## 2016-06-19 DIAGNOSIS — J45901 Unspecified asthma with (acute) exacerbation: Secondary | ICD-10-CM | POA: Diagnosis not present

## 2016-06-19 DIAGNOSIS — I442 Atrioventricular block, complete: Secondary | ICD-10-CM | POA: Diagnosis not present

## 2016-06-19 DIAGNOSIS — Z8249 Family history of ischemic heart disease and other diseases of the circulatory system: Secondary | ICD-10-CM | POA: Diagnosis not present

## 2016-06-19 DIAGNOSIS — R490 Dysphonia: Secondary | ICD-10-CM | POA: Diagnosis not present

## 2016-06-19 MED FILL — Midazolam HCl Inj 5 MG/5ML (Base Equivalent): INTRAMUSCULAR | Qty: 5 | Status: AC

## 2016-06-19 MED FILL — Gentamicin Sulfate Inj 40 MG/ML: INTRAMUSCULAR | Qty: 2 | Status: AC

## 2016-06-19 MED FILL — Fentanyl Citrate Preservative Free (PF) Inj 100 MCG/2ML: INTRAMUSCULAR | Qty: 2 | Status: AC

## 2016-06-19 MED FILL — Sodium Chloride Irrigation Soln 0.9%: Qty: 500 | Status: AC

## 2016-06-19 MED FILL — Lidocaine HCl Local Preservative Free (PF) Inj 1%: INTRAMUSCULAR | Qty: 30 | Status: AC

## 2016-06-19 MED FILL — Heparin Sodium (Porcine) 2 Unit/ML in Sodium Chloride 0.9%: INTRAMUSCULAR | Qty: 500 | Status: AC

## 2016-06-19 NOTE — Progress Notes (Signed)
Per patient and daughter's request, IV has been removed and she has been taken off of tele. She had been told she is being discharged today and requested they be taken off despite education to keep them on in case of emergency.

## 2016-06-19 NOTE — Progress Notes (Signed)
Patient discharged home with her daughter. IV was dc'd and was intact. Discharge instructions were educated on and instructions were given on movement of her left arm.

## 2016-06-19 NOTE — Discharge Instructions (Signed)
° ° °  Supplemental Discharge Instructions for  Pacemaker/Defibrillator Patients  Activity No heavy lifting or vigorous activity with your left/right arm for 6 to 8 weeks.  Do not raise your left/right arm above your head for one week.  Gradually raise your affected arm as drawn below.             06/22/16                   06/23/16                 06/24/16                 06/25/16 __  NO DRIVING until cleared to by Dr. Elberta Fortisamnitz after your wound check visit  WOUND CARE - Keep the wound area clean and dry.  Do not get this area wet for one week. No showers for one week; you may shower on 06/25/16    . - The tape/steri-strips on your wound will fall off; do not pull them off.  No bandage is needed on the site.  DO  NOT apply any creams, oils, or ointments to the wound area. - If you notice any drainage or discharge from the wound, any swelling or bruising at the site, or you develop a fever > 101? F after you are discharged home, call the office at once.  Special Instructions - You are still able to use cellular telephones; use the ear opposite the side where you have your pacemaker/defibrillator.  Avoid carrying your cellular phone near your device. - When traveling through airports, show security personnel your identification card to avoid being screened in the metal detectors.  Ask the security personnel to use the hand wand. - Avoid arc welding equipment, MRI testing (magnetic resonance imaging), TENS units (transcutaneous nerve stimulators).  Call the office for questions about other devices. - Avoid electrical appliances that are in poor condition or are not properly grounded. - Microwave ovens are safe to be near or to operate.  Additional information for defibrillator patients should your device go off: - If your device goes off ONCE and you feel fine afterward, notify the device clinic nurses. - If your device goes off ONCE and you do not feel well afterward, call 911. - If your  device goes off TWICE, call 911. - If your device goes off THREE times in one day, call 911.  DO NOT DRIVE YOURSELF OR A FAMILY MEMBER WITH A DEFIBRILLATOR TO THE HOSPITAL--CALL 911.

## 2016-06-29 ENCOUNTER — Ambulatory Visit (INDEPENDENT_AMBULATORY_CARE_PROVIDER_SITE_OTHER): Payer: PPO | Admitting: *Deleted

## 2016-06-29 DIAGNOSIS — Z950 Presence of cardiac pacemaker: Secondary | ICD-10-CM

## 2016-06-29 DIAGNOSIS — I442 Atrioventricular block, complete: Secondary | ICD-10-CM | POA: Diagnosis not present

## 2016-06-29 LAB — CUP PACEART INCLINIC DEVICE CHECK
Brady Statistic RV Percent Paced: 98 %
Date Time Interrogation Session: 20171023154506
Implantable Lead Implant Date: 20171012
Implantable Lead Location: 753860
Lead Channel Impedance Value: 575 Ohm
Lead Channel Pacing Threshold Amplitude: 0.75 V
Lead Channel Pacing Threshold Amplitude: 0.75 V
Lead Channel Sensing Intrinsic Amplitude: 10.8 mV
Lead Channel Setting Sensing Sensitivity: 2 mV
MDC IDC LEAD IMPLANT DT: 20171012
MDC IDC LEAD LOCATION: 753859
MDC IDC MSMT BATTERY REMAINING LONGEVITY: 74.4
MDC IDC MSMT BATTERY VOLTAGE: 3.04 V
MDC IDC MSMT LEADCHNL RA IMPEDANCE VALUE: 525 Ohm
MDC IDC MSMT LEADCHNL RA PACING THRESHOLD PULSEWIDTH: 0.5 ms
MDC IDC MSMT LEADCHNL RA SENSING INTR AMPL: 2.7 mV
MDC IDC MSMT LEADCHNL RV PACING THRESHOLD PULSEWIDTH: 0.5 ms
MDC IDC SET LEADCHNL RA PACING AMPLITUDE: 3.5 V
MDC IDC SET LEADCHNL RV PACING AMPLITUDE: 3.5 V
MDC IDC SET LEADCHNL RV PACING PULSEWIDTH: 0.5 ms
MDC IDC STAT BRADY RA PERCENT PACED: 19 %
Pulse Gen Model: 2272
Pulse Gen Serial Number: 7955201

## 2016-06-29 NOTE — Progress Notes (Signed)
Wound check appointment. Steri-strips removed. Wound without redness or edema. Incision edges approximated, wound well healed. Normal device function. Thresholds, sensing, and impedances consistent with implant measurements. Device programmed at 3.5V for extra safety margin until 3 month visit. Histogram distribution appropriate for patient and level of activity. No mode switches or high ventricular rates noted. Patient educated about wound care, arm mobility, lifting restrictions. ROV with WC on 09/21/16.

## 2016-07-17 DIAGNOSIS — J45901 Unspecified asthma with (acute) exacerbation: Secondary | ICD-10-CM | POA: Diagnosis not present

## 2016-07-24 ENCOUNTER — Ambulatory Visit (INDEPENDENT_AMBULATORY_CARE_PROVIDER_SITE_OTHER): Payer: PPO | Admitting: Family Medicine

## 2016-07-24 ENCOUNTER — Encounter: Payer: Self-pay | Admitting: Family Medicine

## 2016-07-24 VITALS — BP 170/109 | HR 59 | Temp 98.3°F | Ht 65.0 in | Wt 199.0 lb

## 2016-07-24 DIAGNOSIS — Z23 Encounter for immunization: Secondary | ICD-10-CM | POA: Diagnosis not present

## 2016-07-24 DIAGNOSIS — I10 Essential (primary) hypertension: Secondary | ICD-10-CM | POA: Diagnosis not present

## 2016-07-24 DIAGNOSIS — I442 Atrioventricular block, complete: Secondary | ICD-10-CM

## 2016-07-24 MED ORDER — AMLODIPINE BESYLATE 5 MG PO TABS: 5.0000 mg | ORAL_TABLET | Freq: Every day | ORAL | 3 refills | Status: DC

## 2016-07-24 NOTE — Progress Notes (Signed)
Pre visit review using our clinic review tool, if applicable. No additional management support is needed unless otherwise documented below in the visit note. 

## 2016-07-24 NOTE — Addendum Note (Signed)
Addended by: Aniceto BossNIMMONS, Enisa Runyan A on: 07/24/2016 04:15 PM   Modules accepted: Orders

## 2016-07-24 NOTE — Progress Notes (Signed)
   Subjective:    Patient ID: Whitney Evans, female    DOB: 1946/09/01, 70 y.o.   MRN: [id number]  HPI Here for treatment of HTN. She feels well in general although her exercise tolerance is reduced. No SOB or chest pain. She has never been treated for HTN. She developed 3rd degree heart block and had a pacemaker implanted per Dr. Elberta Fortisamnitz on 06-18-16. This was very successful, but her BP has been elevated ever since. Her labs were all normal including TSH and creatinine.    Review of Systems  Constitutional: Negative.   Respiratory: Negative.   Cardiovascular: Negative.   Neurological: Negative.        Objective:   Physical Exam  Constitutional: She is oriented to person, place, and time. She appears well-developed and well-nourished.  Neck: No thyromegaly present.  Cardiovascular: Normal rate, normal heart sounds and intact distal pulses.   No murmur heard. Irregular rhythm   Pulmonary/Chest: Effort normal and breath sounds normal.  Musculoskeletal: She exhibits no edema.  Lymphadenopathy:    She has no cervical adenopathy.  Neurological: She is alert and oriented to person, place, and time.          Assessment & Plan:  HTN. We will start her on Amlodipine 5 mg daily. Recheck in 3 weeks.  Whitney Evans,Thaison Kolodziejski A, MD

## 2016-08-14 ENCOUNTER — Ambulatory Visit (INDEPENDENT_AMBULATORY_CARE_PROVIDER_SITE_OTHER): Payer: PPO | Admitting: Family Medicine

## 2016-08-14 ENCOUNTER — Encounter: Payer: Self-pay | Admitting: Family Medicine

## 2016-08-14 VITALS — BP 135/90 | HR 88 | Temp 98.5°F | Ht 65.0 in | Wt 199.0 lb

## 2016-08-14 DIAGNOSIS — I442 Atrioventricular block, complete: Secondary | ICD-10-CM

## 2016-08-14 DIAGNOSIS — I10 Essential (primary) hypertension: Secondary | ICD-10-CM | POA: Diagnosis not present

## 2016-08-14 MED ORDER — LISINOPRIL 10 MG PO TABS: 10.0000 mg | ORAL_TABLET | Freq: Every day | ORAL | 3 refills | Status: DC

## 2016-08-14 NOTE — Progress Notes (Signed)
   Subjective:    Patient ID: Concha PyoJanice A Lawhead, female    DOB: Jun 12, 1946, 70 y.o.   MRN: [id number]  HPI Here to follow up on HTN. She was here 3 weeks ago with a BP of 170/109, and she was started on Amlodipine 5 mg daily. She has recently had a pacemaker placed as well. She feels great and her BP at home has improved. Her systolic values range 130 to 150 and diastolics range 90-100.    Review of Systems  Constitutional: Negative.   Respiratory: Negative.   Cardiovascular: Negative.   Neurological: Negative.        Objective:   Physical Exam  Constitutional: She is oriented to person, place, and time. She appears well-developed and well-nourished.  Neck: No thyromegaly present.  Cardiovascular: Normal rate, regular rhythm, normal heart sounds and intact distal pulses.   Pulmonary/Chest: Effort normal and breath sounds normal.  Lymphadenopathy:    She has no cervical adenopathy.  Neurological: She is alert and oriented to person, place, and time.          Assessment & Plan:  HTN has improved but is not yet to goal. We will add Lisinopril 10 mg daily. She is set to follow up with Cardiology in 5 weeks, but in the meantime she will follow the BP at home.  Nelwyn SalisburyFRY,STEPHEN A, MD

## 2016-08-14 NOTE — Progress Notes (Signed)
Pre visit review using our clinic review tool, if applicable. No additional management support is needed unless otherwise documented below in the visit note. 

## 2016-08-16 DIAGNOSIS — J45901 Unspecified asthma with (acute) exacerbation: Secondary | ICD-10-CM | POA: Diagnosis not present

## 2016-08-21 ENCOUNTER — Other Ambulatory Visit: Payer: Self-pay | Admitting: Family Medicine

## 2016-08-21 NOTE — Telephone Encounter (Signed)
Should pt be taking this medication? 

## 2016-09-08 DIAGNOSIS — E28310 Symptomatic premature menopause: Secondary | ICD-10-CM | POA: Diagnosis not present

## 2016-09-08 DIAGNOSIS — R7989 Other specified abnormal findings of blood chemistry: Secondary | ICD-10-CM | POA: Diagnosis not present

## 2016-09-08 DIAGNOSIS — E559 Vitamin D deficiency, unspecified: Secondary | ICD-10-CM | POA: Diagnosis not present

## 2016-09-08 DIAGNOSIS — E039 Hypothyroidism, unspecified: Secondary | ICD-10-CM | POA: Diagnosis not present

## 2016-09-16 ENCOUNTER — Other Ambulatory Visit: Payer: Self-pay | Admitting: *Deleted

## 2016-09-16 DIAGNOSIS — J45901 Unspecified asthma with (acute) exacerbation: Secondary | ICD-10-CM | POA: Diagnosis not present

## 2016-09-16 MED ORDER — AMLODIPINE BESYLATE 5 MG PO TABS: 5.0000 mg | ORAL_TABLET | Freq: Every day | ORAL | 1 refills | Status: DC

## 2016-09-21 ENCOUNTER — Encounter: Payer: Self-pay | Admitting: Cardiology

## 2016-09-21 ENCOUNTER — Ambulatory Visit (INDEPENDENT_AMBULATORY_CARE_PROVIDER_SITE_OTHER): Payer: PPO | Admitting: Cardiology

## 2016-09-21 VITALS — BP 126/90 | HR 103 | Ht 65.0 in | Wt 200.2 lb

## 2016-09-21 DIAGNOSIS — I442 Atrioventricular block, complete: Secondary | ICD-10-CM | POA: Diagnosis not present

## 2016-09-21 DIAGNOSIS — Z950 Presence of cardiac pacemaker: Secondary | ICD-10-CM | POA: Diagnosis not present

## 2016-09-21 NOTE — Progress Notes (Signed)
Electrophysiology Office Note   Date:  09/21/2016   ID:  Whitney Evans, DOB Feb 03, 1946, MRN [id number]  PCP:  Whitney Crane, MD  Cardiologist:  Whitney Evans Primary Electrophysiologist:  Whitney Evans Whitney Loa, MD    Chief Complaint  Patient presents with  . Pacemaker Check    91 day post implant     History of Present Illness: Whitney Evans is a 71 y.o. female who presents today for electrophysiology evaluation.   She was admitted to the hospital in October 2017 with syncopal event and was found to be in complete heart block with heart rates in the 30s. No reversible cause was found and therefore she underwent St. Jude dual-chamber pacemaker placement on 06/18/16.   Today, she denies symptoms of palpitations, chest pain, shortness of breath, orthopnea, PND, lower extremity edema, claudication, dizziness, presyncope, syncope, bleeding, or neurologic sequela. The patient is tolerating medications without difficulties and is otherwise without complaint today.    Past Medical History:  Diagnosis Date  . Allergic rhinitis   . Asthma   . Chronic hoarseness    sees Dr. Benay Evans (ENT) in [place]   . Essential hypertension   . GERD (gastroesophageal reflux disease)   . Hypothyroidism   . Insomnia   . Neck pain, chronic    Past Surgical History:  Procedure Laterality Date  . CESAREAN SECTION     x 2  . COLONOSCOPY  05/24/07   per Dr. Russella Evans, benign polpys, and diverticulosis, repeat 5 years   . EP IMPLANTABLE DEVICE N/A 06/18/2016   Procedure: Pacemaker Implant;  Surgeon: Whitney Alkhatib Whitney Loa, MD;  Location: [facility];  Service: Cardiovascular;  Laterality: N/A;  . EYE SURGERY    . left foot surgery    . VESICOVAGINAL FISTULA CLOSURE W/ TAH    . WISDOM TOOTH EXTRACTION       Current Outpatient Prescriptions  Medication Sig Dispense Refill  . albuterol (PROAIR HFA) 108 (90 Base) MCG/ACT inhaler Inhale 2 puffs into the lungs every 4 (four) hours as needed. 3  Inhaler 3  . amLODipine (NORVASC) 5 MG tablet Take 1 tablet (5 mg total) by mouth daily. 90 tablet 1  . Cholecalciferol (VITAMIN D3) 10000 units capsule Take 10,000 Units by mouth daily.    . Coenzyme Q10 (CO Q 10 PO) Take 400 mg by mouth daily.    Marland Kitchen ipratropium-albuterol (DUONEB) 0.5-2.5 (3) MG/3ML SOLN Take 3 mLs by nebulization every 4 (four) hours as needed (asthma).    Marland Kitchen levothyroxine (SYNTHROID, LEVOTHROID) 75 MCG tablet TAKE 1 TABLET (75 MCG TOTAL) BY MOUTH DAILY. 90 tablet 3  . lisinopril (PRINIVIL,ZESTRIL) 10 MG tablet Take 1 tablet (10 mg total) by mouth daily. 30 tablet 3  . MAGNESIUM PO Take 259 mg by mouth at bedtime.    . montelukast (SINGULAIR) 10 MG tablet Take 10 mg by mouth daily.    . Multiple Vitamin (MULTIVITAMIN WITH MINERALS) TABS tablet Take 1 tablet by mouth daily.    . Omega-3 Fatty Acids (FISH OIL PO) Take by mouth daily.    . progesterone (PROMETRIUM) 100 MG capsule Take 100 mg by mouth at bedtime.    . TURMERIC PO Take 1,000 mg by mouth daily.     No current facility-administered medications for this visit.     Allergies:   Morphine sulfate and Pneumococcal vaccine polyvalent   Social History:  The patient  reports that she has quit smoking. She has never used smokeless tobacco. She reports that she  drinks about 1.2 oz of alcohol per week . She reports that she does not use drugs.   Family History:  The patient's family history includes Coronary artery disease in her other; Diabetes in her other; Heart attack (age of onset: 969) in her father; Hyperlipidemia in her other; Hypertension in her other; Stroke in her mother and other.    ROS:  Please see the history of present illness.   Otherwise, review of systems is positive for cough.   All other systems are reviewed and negative.    PHYSICAL EXAM: VS:  BP 126/90   Pulse (!) 103   Ht 5\' 5"  (1.651 m)   Wt 200 lb 3.2 oz (90.8 kg)   BMI 33.32 kg/m  , BMI Body mass index is 33.32 kg/m. GEN: Well nourished,  well developed, in no acute distress  HEENT: normal  Neck: no JVD, carotid bruits, or masses Cardiac: tachycardic, regular; no murmurs, rubs, or gallops,no edema  Respiratory:  clear to auscultation bilaterally, normal work of breathing GI: soft, nontender, nondistended, + BS MS: no deformity or atrophy  Skin: warm and dry,  device pocket is well healed Neuro:  Strength and sensation are intact Psych: euthymic mood, full affect  EKG:  EKG is ordered today. Personal review of the ekg ordered shows A sense, V pace, rate 103   Device interrogation is reviewed today in detail.  See PaceArt for details.   Recent Labs: 09/23/2015: ALT 16 06/17/2016: B Natriuretic Peptide 1,037.2; Hemoglobin 12.6; Platelets 322 06/18/2016: BUN 18; Creatinine, Ser 1.03; Potassium 5.1; Sodium 138; TSH 2.000    Lipid Panel     Component Value Date/Time   CHOL 226 (H) 09/23/2015 0902   TRIG 124.0 09/23/2015 0902   HDL 61.20 09/23/2015 0902   CHOLHDL 4 09/23/2015 0902   VLDL 24.8 09/23/2015 0902   LDLCALC 140 (H) 09/23/2015 0902   LDLDIRECT 173.6 12/03/2011 0948     Wt Readings from Last 3 Encounters:  09/21/16 200 lb 3.2 oz (90.8 kg)  08/14/16 199 lb (90.3 kg)  07/24/16 199 lb (90.3 kg)      Other studies Reviewed: Additional studies/ records that were reviewed today include: TTE 06/18/16  Review of the above records today demonstrates:  - Left ventricle: The cavity size was normal. Wall thickness was   normal. Systolic function was normal. The estimated ejection   fraction was in the range of 60% to 65%. Wall motion was normal;   there were no regional wall motion abnormalities. The study is   not technically sufficient to allow evaluation of LV diastolic   function. - Aortic valve: There was trivial regurgitation. - Mitral valve: There was moderate regurgitation. - Left atrium: The atrium was mildly dilated.   ASSESSMENT AND PLAN:  1.  Complete heart block: Status post St. Jude  dual-chamber pacemaker placed 06/18/16. Device functioning appropriately. Changes in the device made for chronic therapy. She is tachycardic today, but she says that this is normal for her. Her TSH was checked in October which was also normal.  2. Hypertension: Currently well controlled. No changes were made today.  3. Moderate mitral regurgitation: Currently no symptoms of shortness of breath. We'll continue to follow.    Current medicines are reviewed at length with the patient today.   The patient does not have concerns regarding her medicines.  The following changes were made today:  none  Labs/ tests ordered today include:  Orders Placed This Encounter  Procedures  . EKG 12-Lead  Disposition:   FU with Hopie Pellegrin 9 months  Signed, Jalene Demo Whitney Loa, MD  09/21/2016 11:00 AM     [facility] [address] [place] [address] [phone] (office) [phone] (fax)

## 2016-09-21 NOTE — Patient Instructions (Signed)
Medication Instructions:    Your physician recommends that you continue on your current medications as directed. Please refer to the Current Medication list given to you today.  --- If you need a refill on your cardiac medications before your next appointment, please call your pharmacy. ---  Labwork:  None ordered  Testing/Procedures:  None ordered  Follow-Up: Remote monitoring is used to monitor your Pacemaker of ICD from home. This monitoring reduces the number of office visits required to check your device to one time per year. It allows us to keep an eye on the functioning of your device to ensure it is working properly. You are scheduled for a device check from home on 12/21/2016. You may send your transmission at any time that day. If you have a wireless device, the transmission will be sent automatically. After your physician reviews your transmission, you will receive a postcard with your next transmission date.   Your physician wants you to follow-up in: 9 months with Dr. Camnitz.  You will receive a reminder letter in the mail two months in advance. If you don't receive a letter, please call our office to schedule the follow-up appointment.  Thank you for choosing [facility]!!   Kelcy Laible, RN [phone]   

## 2016-09-24 LAB — CUP PACEART INCLINIC DEVICE CHECK
Date Time Interrogation Session: 20180115164651
Implantable Lead Implant Date: 20171012
Implantable Lead Location: 753859
Implantable Lead Location: 753860
Implantable Pulse Generator Implant Date: 20171012
Lead Channel Impedance Value: 575 Ohm
Lead Channel Pacing Threshold Amplitude: 0.75 V
Lead Channel Pacing Threshold Pulse Width: 0.5 ms
Lead Channel Sensing Intrinsic Amplitude: 6.7 mV
Lead Channel Setting Pacing Amplitude: 2 V
Lead Channel Setting Sensing Sensitivity: 2 mV
MDC IDC LEAD IMPLANT DT: 20171012
MDC IDC MSMT BATTERY VOLTAGE: 2.99 V
MDC IDC MSMT LEADCHNL RA IMPEDANCE VALUE: 462.5 Ohm
MDC IDC MSMT LEADCHNL RA PACING THRESHOLD AMPLITUDE: 0.5 V
MDC IDC MSMT LEADCHNL RA SENSING INTR AMPL: 3.1 mV
MDC IDC MSMT LEADCHNL RV PACING THRESHOLD PULSEWIDTH: 0.5 ms
MDC IDC SET LEADCHNL RV PACING AMPLITUDE: 2.5 V
MDC IDC SET LEADCHNL RV PACING PULSEWIDTH: 0.5 ms
MDC IDC STAT BRADY RA PERCENT PACED: 14 %
MDC IDC STAT BRADY RV PERCENT PACED: 97 %
Pulse Gen Model: 2272
Pulse Gen Serial Number: 7955201

## 2016-10-17 DIAGNOSIS — J45901 Unspecified asthma with (acute) exacerbation: Secondary | ICD-10-CM | POA: Diagnosis not present

## 2016-10-21 ENCOUNTER — Other Ambulatory Visit: Payer: Self-pay | Admitting: Cardiology

## 2016-10-26 ENCOUNTER — Other Ambulatory Visit: Payer: Self-pay | Admitting: Family Medicine

## 2016-10-26 NOTE — Telephone Encounter (Signed)
° ° ° ° ° °  Pt request refill of the following:  amLODipine (NORVASC) 5 MG tablet   Phamacy:   [facility]

## 2016-10-27 MED ORDER — AMLODIPINE BESYLATE 5 MG PO TABS: 5.0000 mg | ORAL_TABLET | Freq: Every day | ORAL | 1 refills | Status: DC

## 2016-11-14 DIAGNOSIS — J45901 Unspecified asthma with (acute) exacerbation: Secondary | ICD-10-CM | POA: Diagnosis not present

## 2016-11-27 ENCOUNTER — Other Ambulatory Visit: Payer: Self-pay | Admitting: Family Medicine

## 2016-12-15 DIAGNOSIS — J45901 Unspecified asthma with (acute) exacerbation: Secondary | ICD-10-CM | POA: Diagnosis not present

## 2016-12-21 ENCOUNTER — Ambulatory Visit (INDEPENDENT_AMBULATORY_CARE_PROVIDER_SITE_OTHER): Payer: PPO | Admitting: *Deleted

## 2016-12-21 DIAGNOSIS — I442 Atrioventricular block, complete: Secondary | ICD-10-CM

## 2016-12-21 NOTE — Progress Notes (Signed)
Remote pacemaker transmission.   

## 2016-12-22 LAB — CUP PACEART REMOTE DEVICE CHECK
Battery Remaining Longevity: 119 mo
Battery Remaining Percentage: 95.5 %
Brady Statistic AP VS Percent: 11 %
Brady Statistic AS VP Percent: 1 %
Brady Statistic AS VS Percent: 89 %
Date Time Interrogation Session: 20180416071853
Implantable Lead Implant Date: 20171012
Implantable Lead Location: 753859
Lead Channel Impedance Value: 440 Ohm
Lead Channel Pacing Threshold Amplitude: 0.75 V
Lead Channel Pacing Threshold Pulse Width: 0.5 ms
Lead Channel Sensing Intrinsic Amplitude: 2.1 mV
Lead Channel Sensing Intrinsic Amplitude: 6.4 mV
Lead Channel Setting Pacing Amplitude: 2 V
Lead Channel Setting Pacing Amplitude: 2.5 V
Lead Channel Setting Pacing Pulse Width: 0.5 ms
MDC IDC LEAD IMPLANT DT: 20171012
MDC IDC LEAD LOCATION: 753860
MDC IDC MSMT BATTERY VOLTAGE: 3.01 V
MDC IDC MSMT LEADCHNL RA PACING THRESHOLD AMPLITUDE: 0.5 V
MDC IDC MSMT LEADCHNL RA PACING THRESHOLD PULSEWIDTH: 0.5 ms
MDC IDC MSMT LEADCHNL RV IMPEDANCE VALUE: 600 Ohm
MDC IDC PG IMPLANT DT: 20171012
MDC IDC SET LEADCHNL RV SENSING SENSITIVITY: 2 mV
MDC IDC STAT BRADY AP VP PERCENT: 1 %
MDC IDC STAT BRADY RA PERCENT PACED: 10 %
MDC IDC STAT BRADY RV PERCENT PACED: 1 %
Pulse Gen Serial Number: 7955201

## 2016-12-23 ENCOUNTER — Encounter: Payer: Self-pay | Admitting: Cardiology

## 2016-12-24 DIAGNOSIS — M810 Age-related osteoporosis without current pathological fracture: Secondary | ICD-10-CM | POA: Diagnosis not present

## 2016-12-24 DIAGNOSIS — M8589 Other specified disorders of bone density and structure, multiple sites: Secondary | ICD-10-CM | POA: Diagnosis not present

## 2016-12-24 DIAGNOSIS — Z1231 Encounter for screening mammogram for malignant neoplasm of breast: Secondary | ICD-10-CM | POA: Diagnosis not present

## 2016-12-24 LAB — HM DEXA SCAN

## 2016-12-24 LAB — HM MAMMOGRAPHY

## 2016-12-24 MED ORDER — METOPROLOL TARTRATE 25 MG PO TABS: 25.0000 mg | ORAL_TABLET | Freq: Two times a day (BID) | ORAL | 3 refills | Status: DC

## 2016-12-24 NOTE — Progress Notes (Signed)
Metoprolol  BID ordered per Dr. Gershon Crane recommendation. Patient aware and agreeable.

## 2016-12-24 NOTE — Addendum Note (Signed)
Addended by: Bethanie Dicker on: 12/24/2016 02:19 PM   Modules accepted: Orders

## 2016-12-29 ENCOUNTER — Encounter: Payer: Self-pay | Admitting: Family Medicine

## 2016-12-29 DIAGNOSIS — M8580 Other specified disorders of bone density and structure, unspecified site: Secondary | ICD-10-CM | POA: Insufficient documentation

## 2017-01-14 DIAGNOSIS — J45901 Unspecified asthma with (acute) exacerbation: Secondary | ICD-10-CM | POA: Diagnosis not present

## 2017-01-21 ENCOUNTER — Other Ambulatory Visit: Payer: Self-pay

## 2017-02-14 DIAGNOSIS — J45901 Unspecified asthma with (acute) exacerbation: Secondary | ICD-10-CM | POA: Diagnosis not present

## 2017-03-16 DIAGNOSIS — J45901 Unspecified asthma with (acute) exacerbation: Secondary | ICD-10-CM | POA: Diagnosis not present

## 2017-03-18 NOTE — Progress Notes (Signed)
Pre visit review using our clinic review tool, if applicable. No additional management support is needed unless otherwise documented below in the visit note. 

## 2017-03-18 NOTE — Progress Notes (Addendum)
Subjective:   Whitney Evans is a 71 y.o. female who presents for an Initial Medicare Annual Wellness Visit.  Review of Systems    No ROS.  Medicare Wellness Visit. Additional risk factors are reflected in the social history.  Cardiac Risk Factors include: advanced age (>54men, >35 women);hypertension;obesity (BMI >30kg/m2)   Sleep patterns:  7-8 hrs/night. Wakes up feeling rested.  Home Safety/Smoke Alarms: Feels safe in home. Smoke alarms in place. Alarm system in place. Living environment; residence and Firearm Safety: A few steps to get into house. Lives alone. Seat Belt Safety/Bike Helmet: Wears seat belt.   Counseling:   Dental- Every 6 months.  Female:   Pap-  Aged out.       Mammo-  12/24/2016  Negative Dexa scan-   12/24/2016 Osteopenia CCS- 05/24/2007. Requests Cologuard.     Objective:    Today's Vitals   03/25/17 1015  BP: (!) 110/58  Pulse: 65  Resp: 16  SpO2: 97%  Weight: 197 lb 14.4 oz (89.8 kg)  Height: 5\' 5"  (1.651 m)   Body mass index is 32.93 kg/m.   Current Medications (verified) Outpatient Encounter Prescriptions as of 03/25/2017  Medication Sig  . Calcium Carbonate-Vit D-Min (CALCIUM 1200 PO) Take 1,200 mg by mouth daily.  Marland Kitchen albuterol (PROAIR HFA) 108 (90 Base) MCG/ACT inhaler Inhale 2 puffs into the lungs every 4 (four) hours as needed.  Marland Kitchen amLODipine (NORVASC) 5 MG tablet Take 1 tablet (5 mg total) by mouth daily.  . Cholecalciferol (VITAMIN D3) 10000 units capsule Take 10,000 Units by mouth daily.  . Coenzyme Q10 (CO Q 10 PO) Take 400 mg by mouth daily.  Marland Kitchen ipratropium-albuterol (DUONEB) 0.5-2.5 (3) MG/3ML SOLN Take 3 mLs by nebulization every 4 (four) hours as needed (asthma).  Marland Kitchen levothyroxine (SYNTHROID, LEVOTHROID) 75 MCG tablet TAKE 1 TABLET (75 MCG TOTAL) BY MOUTH DAILY.  Marland Kitchen lisinopril (PRINIVIL,ZESTRIL) 10 MG tablet TAKE 1 TABLET (10 MG TOTAL) BY MOUTH DAILY.  Marland Kitchen MAGNESIUM PO Take 259 mg by mouth at bedtime.  . metoprolol tartrate  (LOPRESSOR) 25 MG tablet Take 1 tablet (25 mg total) by mouth 2 (two) times daily.  . montelukast (SINGULAIR) 10 MG tablet Take 10 mg by mouth daily.  . Multiple Vitamin (MULTIVITAMIN WITH MINERALS) TABS tablet Take 1 tablet by mouth daily.  . Omega-3 Fatty Acids (FISH OIL PO) Take by mouth daily.  . progesterone (PROMETRIUM) 100 MG capsule Take 100 mg by mouth at bedtime.  . TURMERIC PO Take 1,000 mg by mouth daily.   No facility-administered encounter medications on file as of 03/25/2017.     Allergies (verified) Morphine sulfate and Pneumococcal vaccine polyvalent   History: Past Medical History:  Diagnosis Date  . Allergic rhinitis   . Asthma   . Chronic hoarseness    sees Whitney Evans (ENT) in [place]   . Essential hypertension   . GERD (gastroesophageal reflux disease)   . Hypothyroidism   . Insomnia   . Neck pain, chronic    Past Surgical History:  Procedure Laterality Date  . CESAREAN SECTION     x 2  . COLONOSCOPY  05/24/07   per Dr. Russella Evans, benign polpys, and diverticulosis, repeat 5 years   . EP IMPLANTABLE DEVICE N/A 06/18/2016   Procedure: Pacemaker Implant;  Surgeon: Whitney Evans;  Location: [facility];  Service: Cardiovascular;  Laterality: N/A;  . EYE SURGERY    . left foot surgery    . VESICOVAGINAL FISTULA CLOSURE  W/ TAH    . WISDOM TOOTH EXTRACTION     Family History  Problem Relation Age of Onset  . Coronary artery disease Other   . Diabetes Other   . Hyperlipidemia Other   . Hypertension Other   . Stroke Other   . Stroke Mother   . Heart attack Father 24   Social History   Occupational History  . Not on file.   Social History Main Topics  . Smoking status: Former Games developer  . Smokeless tobacco: Never Used  . Alcohol use 1.2 oz/week    2 Standard drinks or equivalent per week     Comment: glass of wine  . Drug use: No  . Sexual activity: Not on file    Tobacco Counseling Counseling given: Not Answered   Activities of  Daily Living In your present state of health, do you have any difficulty performing the following activities: 03/25/2017 06/17/2016  Hearing? N N  Vision? N N  Difficulty concentrating or making decisions? N N  Walking or climbing stairs? N N  Dressing or bathing? N N  Doing errands, shopping? N N  Preparing Food and eating ? N -  Using the Toilet? N -  In the past six months, have you accidently leaked urine? Y -  Do you have problems with loss of bowel control? N -  Managing your Medications? N -  Managing your Finances? N -  Housekeeping or managing your Housekeeping? N -  Some recent data might be hidden    Immunizations and Health Maintenance Immunization History  Administered Date(s) Administered  . Influenza, High Dose Seasonal PF 07/24/2016  . Influenza,inj,Quad PF,36+ Mos 07/03/2014  . Influenza-Unspecified 06/11/2015  . Tdap 09/23/2015   Health Maintenance Due  Topic Date Due  . Hepatitis C Screening  05/28/1946  . PNA vac Low Risk Adult (1 of 2 - PCV13) 04/28/2013    Patient Care Team: Whitney Salisbury, Evans as PCP - General (Family Medicine)  Indicate any recent Medical Services you may have received from other than [facility] providers in the past year (date may be approximate).     Assessment:   This is a routine wellness examination for Whitney Evans. Physical assessment deferred to PCP.   Hearing/Vision screen Hearing Screening Comments: Able to hear conversational tones w/o difficulty. No issues reported.   Vision Screening Comments: Every year. Wears bifocals.  Dietary issues and exercise activities discussed: Current Exercise Habits: The patient does not participate in regular exercise at present (Has joined [employer], starts next week.), Exercise limited by: None identified   Diet (meal preparation, eat out, water intake, caffeinated beverages, dairy products, fruits and vegetables): 3 meals/day. Uses crock pot. Rare fast food use. Eats out at restaurants at least  once/week. 3 big glasses water/day. Also drinks green tea with peach.  Breakfast: Oatmeal with raisins. 3 cups coffee. Lunch: Salads with chicken or soup or chili.  Dinner: Chicken, vegetables. Usually does stir fry.     Goals    . Get off of some of my medications      Depression Screen PHQ 2/9 Scores 03/25/2017 09/23/2015 07/03/2014  PHQ - 2 Score 1 0 0    Fall Risk Fall Risk  03/25/2017 09/23/2015 07/03/2014  Falls in the past year? No No No    Cognitive Function:   Ad8 score reviewed for issues:  Issues making decisions: no  Less interest in hobbies / activities:no  Repeats questions, stories (family complaining):no  Trouble using ordinary gadgets (  microwave, computer, phone):no  Forgets the month or year: no  Mismanaging finances: no  Remembering appts:no  Daily problems with thinking and/or memory:no Ad8 score is=0      Screening Tests Health Maintenance  Topic Date Due  . Hepatitis C Screening  [birth year]  . PNA vac Low Risk Adult (1 of 2 - PCV13) 04/28/2013  . INFLUENZA VACCINE  04/07/2017  . COLONOSCOPY  05/23/2017  . MAMMOGRAM  12/25/2018  . TETANUS/TDAP  09/22/2025  . DEXA SCAN  Completed      Plan:    Please schedule yearly exam with Dr Clent RidgesFry on your way out.  Pt would like to be screened for hepatitis c at next lab draw.   Cologuard ordered per pt request. She does not want to do another colonoscopy.  Prescriptions renewed for Singulair and Duoneb.   I have personally reviewed and noted the following in the patient's chart:   . Medical and social history . Use of alcohol, tobacco or illicit drugs  . Current medications and supplements . Functional ability and status . Nutritional status . Physical activity . Advanced directives . List of other physicians . Vitals . Screenings to include cognitive, depression, and falls . Referrals and appointments  In addition, I have reviewed and discussed with patient certain preventive protocols,  quality metrics, and best practice recommendations. A written personalized care plan for preventive services as well as general preventive health recommendations were provided to patient.     Richelle Itoassandra Albright, RN   03/25/2017   I have read this note and agree with its contents.  Gershon CraneStephen Fry, Evans

## 2017-03-22 ENCOUNTER — Ambulatory Visit (INDEPENDENT_AMBULATORY_CARE_PROVIDER_SITE_OTHER): Payer: PPO | Admitting: *Deleted

## 2017-03-22 DIAGNOSIS — I442 Atrioventricular block, complete: Secondary | ICD-10-CM | POA: Diagnosis not present

## 2017-03-22 NOTE — Progress Notes (Signed)
Remote pacemaker transmission.   

## 2017-03-24 ENCOUNTER — Encounter: Payer: Self-pay | Admitting: Cardiology

## 2017-03-24 LAB — CUP PACEART REMOTE DEVICE CHECK
Battery Remaining Percentage: 95.5 %
Brady Statistic AP VS Percent: 17 %
Brady Statistic AS VP Percent: 1 %
Brady Statistic RA Percent Paced: 16 %
Brady Statistic RV Percent Paced: 1 %
Date Time Interrogation Session: 20180716075811
Implantable Lead Implant Date: 20171012
Implantable Lead Location: 753859
Implantable Lead Location: 753860
Lead Channel Pacing Threshold Amplitude: 0.75 V
Lead Channel Pacing Threshold Pulse Width: 0.5 ms
Lead Channel Sensing Intrinsic Amplitude: 6.4 mV
Lead Channel Setting Pacing Amplitude: 2 V
Lead Channel Setting Pacing Pulse Width: 0.5 ms
Lead Channel Setting Sensing Sensitivity: 2 mV
MDC IDC LEAD IMPLANT DT: 20171012
MDC IDC MSMT BATTERY REMAINING LONGEVITY: 116 mo
MDC IDC MSMT BATTERY VOLTAGE: 3.01 V
MDC IDC MSMT LEADCHNL RA IMPEDANCE VALUE: 430 Ohm
MDC IDC MSMT LEADCHNL RA PACING THRESHOLD AMPLITUDE: 0.5 V
MDC IDC MSMT LEADCHNL RA SENSING INTR AMPL: 2.1 mV
MDC IDC MSMT LEADCHNL RV IMPEDANCE VALUE: 580 Ohm
MDC IDC MSMT LEADCHNL RV PACING THRESHOLD PULSEWIDTH: 0.5 ms
MDC IDC PG IMPLANT DT: 20171012
MDC IDC SET LEADCHNL RV PACING AMPLITUDE: 2.5 V
MDC IDC STAT BRADY AP VP PERCENT: 1 %
MDC IDC STAT BRADY AS VS PERCENT: 83 %
Pulse Gen Model: 2272
Pulse Gen Serial Number: 7955201

## 2017-03-25 ENCOUNTER — Ambulatory Visit (INDEPENDENT_AMBULATORY_CARE_PROVIDER_SITE_OTHER): Payer: PPO

## 2017-03-25 VITALS — BP 110/58 | HR 65 | Resp 16 | Ht 65.0 in | Wt 197.9 lb

## 2017-03-25 DIAGNOSIS — Z0000 Encounter for general adult medical examination without abnormal findings: Secondary | ICD-10-CM

## 2017-03-25 DIAGNOSIS — J45909 Unspecified asthma, uncomplicated: Secondary | ICD-10-CM | POA: Diagnosis not present

## 2017-03-25 MED ORDER — IPRATROPIUM-ALBUTEROL 0.5-2.5 (3) MG/3ML IN SOLN: 3.0000 mL | RESPIRATORY_TRACT | 1 refills | Status: DC | PRN

## 2017-03-25 MED ORDER — MONTELUKAST SODIUM 10 MG PO TABS: 10.0000 mg | ORAL_TABLET | Freq: Every day | ORAL | 1 refills | Status: DC

## 2017-03-25 NOTE — Patient Instructions (Signed)
Please schedule yearly exam with Dr Sarajane Jews on your way out. Have your Hepatitis C screening collected during next blood draw.  Preventive Care 69 Years and Older, Female Preventive care refers to lifestyle choices and visits with your health care provider that can promote health and wellness. What does preventive care include?  A yearly physical exam. This is also called an annual well check.  Dental exams once or twice a year.  Routine eye exams. Ask your health care provider how often you should have your eyes checked.  Personal lifestyle choices, including: ? Daily care of your teeth and gums. ? Regular physical activity. ? Eating a healthy diet. ? Avoiding tobacco and drug use. ? Limiting alcohol use. ? Practicing safe sex. ? Taking low-dose aspirin every day. ? Taking vitamin and mineral supplements as recommended by your health care provider. What happens during an annual well check? The services and screenings done by your health care provider during your annual well check will depend on your age, overall health, lifestyle risk factors, and family history of disease. Counseling Your health care provider may ask you questions about your:  Alcohol use.  Tobacco use.  Drug use.  Emotional well-being.  Home and relationship well-being.  Sexual activity.  Eating habits.  History of falls.  Memory and ability to understand (cognition).  Work and work Statistician.  Reproductive health.  Screening You may have the following tests or measurements:  Height, weight, and BMI.  Blood pressure.  Lipid and cholesterol levels. These may be checked every 5 years, or more frequently if you are over 65 years old.  Skin check.  Lung cancer screening. You may have this screening every year starting at age 69 if you have a 30-pack-year history of smoking and currently smoke or have quit within the past 15 years.  Fecal occult blood test (FOBT) of the stool. You may have  this test every year starting at age 69.  Flexible sigmoidoscopy or colonoscopy. You may have a sigmoidoscopy every 5 years or a colonoscopy every 10 years starting at age 69.  Hepatitis C blood test.  Hepatitis B blood test.  Sexually transmitted disease (STD) testing.  Diabetes screening. This is done by checking your blood sugar (glucose) after you have not eaten for a while (fasting). You may have this done every 1-3 years.  Bone density scan. This is done to screen for osteoporosis. You may have this done starting at age 69.  Mammogram. This may be done every 1-2 years. Talk to your health care provider about how often you should have regular mammograms.  Talk with your health care provider about your test results, treatment options, and if necessary, the need for more tests. Vaccines Your health care provider may recommend certain vaccines, such as:  Influenza vaccine. This is recommended every year.  Tetanus, diphtheria, and acellular pertussis (Tdap, Td) vaccine. You may need a Td booster every 10 years.  Varicella vaccine. You may need this if you have not been vaccinated.  Zoster vaccine. You may need this after age 69.  Measles, mumps, and rubella (MMR) vaccine. You may need at least one dose of MMR if you were born in 1957 or later. You may also need a second dose.  Pneumococcal 13-valent conjugate (PCV13) vaccine. One dose is recommended after age 69.  Pneumococcal polysaccharide (PPSV23) vaccine. One dose is recommended after age 69.  Meningococcal vaccine. You may need this if you have certain conditions.  Hepatitis A vaccine. You may  need this if you have certain conditions or if you travel or work in places where you may be exposed to hepatitis A.  Hepatitis B vaccine. You may need this if you have certain conditions or if you travel or work in places where you may be exposed to hepatitis B.  Haemophilus influenzae type b (Hib) vaccine. You may need this if  you have certain conditions.  Talk to your health care provider about which screenings and vaccines you need and how often you need them. This information is not intended to replace advice given to you by your health care provider. Make sure you discuss any questions you have with your health care provider. Document Released: 09/20/2015 Document Revised: 05/13/2016 Document Reviewed: 06/25/2015 Elsevier Interactive Patient Education  2017 [employer].

## 2017-03-26 ENCOUNTER — Telehealth: Payer: Self-pay | Admitting: *Deleted

## 2017-03-26 NOTE — Telephone Encounter (Signed)
Spoke with patient regarding AMS/HVR (1:1 SVT per EGMs) episodes on recent remote transmission.  Patient asymptomatic with episodes.  [place] and did much more activity (stairs) than she does here.  She was asymptomatic with SVT episodes.  Advised patient to call our office if she experiences any symptoms of palpitations, dizziness, chest discomfort, or other cardiac symptoms in the future.  Patient verbalizes understanding and appreciation.  Scheduled 6083m f/u appointment with Dr. Elberta Fortisamnitz per patient request--06/16/17 at 8:00am.

## 2017-04-07 ENCOUNTER — Other Ambulatory Visit: Payer: Self-pay | Admitting: Family Medicine

## 2017-04-12 DIAGNOSIS — Z1211 Encounter for screening for malignant neoplasm of colon: Secondary | ICD-10-CM | POA: Diagnosis not present

## 2017-04-12 DIAGNOSIS — Z1212 Encounter for screening for malignant neoplasm of rectum: Secondary | ICD-10-CM | POA: Diagnosis not present

## 2017-04-13 ENCOUNTER — Encounter: Payer: Self-pay | Admitting: Family Medicine

## 2017-04-16 DIAGNOSIS — J45901 Unspecified asthma with (acute) exacerbation: Secondary | ICD-10-CM | POA: Diagnosis not present

## 2017-04-17 LAB — COLOGUARD

## 2017-04-20 ENCOUNTER — Telehealth: Payer: Self-pay | Admitting: Family Medicine

## 2017-04-20 DIAGNOSIS — R6889 Other general symptoms and signs: Secondary | ICD-10-CM

## 2017-04-20 NOTE — Telephone Encounter (Signed)
I did cancel the referral

## 2017-04-20 NOTE — Telephone Encounter (Signed)
I spoke with Whitney Evans and gave message about Cologuard results, she has appointment with Dr. Clent RidgesFry on 05/12/2017 will discuss further at that time. Doesn't want to proceed with GI referral until that time.

## 2017-04-20 NOTE — Telephone Encounter (Signed)
Referral was done to GI

## 2017-04-20 NOTE — Telephone Encounter (Signed)
Cologuard result is positive, pt will need referral to GI to further evaluate.

## 2017-04-23 ENCOUNTER — Other Ambulatory Visit: Payer: Self-pay | Admitting: Family Medicine

## 2017-04-23 NOTE — Telephone Encounter (Signed)
Sent to the pharmacy by e-scribe for 90 days.  Pt has upcoming cpx on 05/12/17 with Dr. Clent Ridges.

## 2017-05-12 ENCOUNTER — Encounter: Payer: PPO | Admitting: Family Medicine

## 2017-05-17 DIAGNOSIS — J45901 Unspecified asthma with (acute) exacerbation: Secondary | ICD-10-CM | POA: Diagnosis not present

## 2017-05-18 ENCOUNTER — Other Ambulatory Visit: Payer: Self-pay | Admitting: Family Medicine

## 2017-05-27 ENCOUNTER — Encounter: Payer: Self-pay | Admitting: Family Medicine

## 2017-06-16 ENCOUNTER — Ambulatory Visit (INDEPENDENT_AMBULATORY_CARE_PROVIDER_SITE_OTHER): Payer: PPO | Admitting: Cardiology

## 2017-06-16 ENCOUNTER — Encounter: Payer: Self-pay | Admitting: Cardiology

## 2017-06-16 VITALS — BP 106/68 | HR 65 | Ht 65.0 in | Wt 203.8 lb

## 2017-06-16 DIAGNOSIS — J45901 Unspecified asthma with (acute) exacerbation: Secondary | ICD-10-CM | POA: Diagnosis not present

## 2017-06-16 DIAGNOSIS — I10 Essential (primary) hypertension: Secondary | ICD-10-CM

## 2017-06-16 DIAGNOSIS — Z950 Presence of cardiac pacemaker: Secondary | ICD-10-CM

## 2017-06-16 DIAGNOSIS — I340 Nonrheumatic mitral (valve) insufficiency: Secondary | ICD-10-CM

## 2017-06-16 DIAGNOSIS — I442 Atrioventricular block, complete: Secondary | ICD-10-CM | POA: Diagnosis not present

## 2017-06-16 DIAGNOSIS — Z79899 Other long term (current) drug therapy: Secondary | ICD-10-CM

## 2017-06-16 LAB — BASIC METABOLIC PANEL
BUN / CREAT RATIO: 13 (ref 12–28)
BUN: 12 mg/dL (ref 8–27)
CO2: 21 mmol/L (ref 20–29)
CREATININE: 0.91 mg/dL (ref 0.57–1.00)
Calcium: 9.5 mg/dL (ref 8.7–10.3)
Chloride: 103 mmol/L (ref 96–106)
GFR calc Af Amer: 74 mL/min/{1.73_m2} (ref >59)
GFR, EST NON AFRICAN AMERICAN: 65 mL/min/{1.73_m2} (ref >59)
Glucose: 98 mg/dL (ref 65–99)
Potassium: 5 mmol/L (ref 3.5–5.2)
SODIUM: 139 mmol/L (ref 134–144)

## 2017-06-16 LAB — CUP PACEART INCLINIC DEVICE CHECK
Battery Remaining Longevity: 128 mo
Brady Statistic RA Percent Paced: 23 %
Implantable Lead Implant Date: 20171012
Implantable Lead Implant Date: 20171012
Implantable Lead Location: 753859
Lead Channel Impedance Value: 437.5 Ohm
Lead Channel Pacing Threshold Amplitude: 0.5 V
Lead Channel Pacing Threshold Pulse Width: 0.5 ms
Lead Channel Sensing Intrinsic Amplitude: 2.6 mV
Lead Channel Sensing Intrinsic Amplitude: 4.9 mV
Lead Channel Setting Pacing Amplitude: 2 V
Lead Channel Setting Pacing Amplitude: 2.5 V
Lead Channel Setting Sensing Sensitivity: 2 mV
MDC IDC LEAD LOCATION: 753860
MDC IDC MSMT BATTERY VOLTAGE: 3.01 V
MDC IDC MSMT LEADCHNL RA PACING THRESHOLD PULSEWIDTH: 0.5 ms
MDC IDC MSMT LEADCHNL RV IMPEDANCE VALUE: 600 Ohm
MDC IDC MSMT LEADCHNL RV PACING THRESHOLD AMPLITUDE: 1 V
MDC IDC PG IMPLANT DT: 20171012
MDC IDC PG SERIAL: 7955201
MDC IDC SESS DTM: 20181010121317
MDC IDC SET LEADCHNL RV PACING PULSEWIDTH: 0.5 ms
MDC IDC STAT BRADY RV PERCENT PACED: 0.17 %

## 2017-06-16 NOTE — Progress Notes (Signed)
Electrophysiology Office Note   Date:  06/16/2017   ID:  Whitney, Evans [birth year], MRN [id number]  PCP:  Nelwyn Salisbury, MD  Cardiologist:  Diona Browner Primary Electrophysiologist:  Arrin Ishler [name], MD    Chief Complaint  Patient presents with  . Pacemaker Check     History of Present Illness: Whitney Evans is a 71 y.o. female who presents today for electrophysiology evaluation.   She was admitted to the hospital in October 2017 with syncopal event and was found to be in complete heart block with heart rates in the 30s. Reversible causes found and she therefore underwent St. Jude dual-chamber pacemaker implant on 06/18/16. She returns today for follow-up.   Today, denies symptoms of palpitations, chest pain, shortness of breath, orthopnea, PND, lower extremity edema, claudication, dizziness, presyncope, syncope, bleeding, or neurologic sequela. The patient is tolerating medications without difficulties. She is trying to get to the gym more often now to exercise and lose weight. She does say that when she walks uphill, she can feel her heart beating, though it is not racing. Otherwise she is doing well without complaint.   Past Medical History:  Diagnosis Date  . Allergic rhinitis   . Asthma   . Chronic hoarseness    sees Dr. [name] (ENT) in [place]   . Essential hypertension   . GERD (gastroesophageal reflux disease)   . Hypothyroidism   . Insomnia   . Neck pain, chronic    Past Surgical History:  Procedure Laterality Date  . CESAREAN SECTION     x 2  . COLONOSCOPY  05/24/07   per Dr. Russella Dar, benign polpys, and diverticulosis, repeat 5 years   . EP IMPLANTABLE DEVICE N/A 06/18/2016   Procedure: Pacemaker Implant;  Surgeon: Other Atienza [name], MD;  Location: [facility];  Service: Cardiovascular;  Laterality: N/A;  . EYE SURGERY    . left foot surgery    . VESICOVAGINAL FISTULA CLOSURE W/ TAH    . WISDOM TOOTH EXTRACTION       Current  Outpatient Prescriptions  Medication Sig Dispense Refill  . albuterol (PROAIR HFA) 108 (90 Base) MCG/ACT inhaler Inhale 2 puffs into the lungs every 4 (four) hours as needed. 3 Inhaler 3  . amLODipine (NORVASC) 5 MG tablet TAKE 1 TABLET (5 MG TOTAL) BY MOUTH DAILY. 90 tablet 0  . Calcium Carbonate-Vit D-Min (CALCIUM 1200 PO) Take 1,200 mg by mouth daily.    . Cholecalciferol (VITAMIN D3) 10000 units capsule Take 10,000 Units by mouth daily.    . Coenzyme Q10 (CO Q 10 PO) Take 400 mg by mouth daily.    Marland Kitchen ipratropium-albuterol (DUONEB) 0.5-2.5 (3) MG/3ML SOLN TAKE 3 MLS BY NEBULIZATION EVERY 4 (FOUR) HOURS AS NEEDED (ASTHMA). 360 mL 1  . lisinopril (PRINIVIL,ZESTRIL) 10 MG tablet TAKE 1 TABLET (10 MG TOTAL) BY MOUTH DAILY. 30 tablet 3  . MAGNESIUM PO Take 259 mg by mouth at bedtime.    . metoprolol tartrate (LOPRESSOR) 25 MG tablet Take 25 mg by mouth 2 (two) times daily.    . montelukast (SINGULAIR) 10 MG tablet TAKE 1 TABLET BY MOUTH EVERY DAY 30 tablet 1  . Multiple Vitamin (MULTIVITAMIN WITH MINERALS) TABS tablet Take 1 tablet by mouth daily.    . NP THYROID 60 MG tablet Take 60 mg by mouth daily.  11  . Omega-3 Fatty Acids (FISH OIL PO) Take by mouth daily.    . progesterone (PROMETRIUM) 100 MG capsule Take 100 mg  by mouth at bedtime.    . TURMERIC PO Take 1,000 mg by mouth daily.     No current facility-administered medications for this visit.     Allergies:   Morphine sulfate and Pneumococcal vaccine polyvalent   Social History:  The patient  reports that she has quit smoking. She has never used smokeless tobacco. She reports that she drinks about 1.2 oz of alcohol per week . She reports that she does not use drugs.   Family History:  The patient's family history includes Coronary artery disease in her other; Diabetes in her other; Heart attack (age of onset: 32) in her father; Hyperlipidemia in her other; Hypertension in her other; Stroke in her mother and other.    ROS:  Please see  the history of present illness.   Otherwise, review of systems is positive for cough, wheezing.   All other systems are reviewed and negative.   PHYSICAL EXAM: VS:  BP 106/68   Pulse 65   Ht  (1.651 m)   Wt 203 lb 12.8 oz (92.4 kg)   BMI 33.91 kg/m  , BMI Body mass index is 33.91 kg/m. GEN: Well nourished, well developed, in no acute distress  HEENT: normal  Neck: no JVD, carotid bruits, or masses Cardiac: RRR; no murmurs, rubs, or gallops,no edema  Respiratory:  clear to auscultation bilaterally, normal work of breathing GI: soft, nontender, nondistended, + BS MS: no deformity or atrophy  Skin: warm and dry, device site well healed Neuro:  Strength and sensation are intact Psych: euthymic mood, full affect  EKG:  EKG is not ordered today. Personal review of the ekg ordered 09/21/16 shows sinus rhythm, V paced  Personal review of the device interrogation today. Results in Paceart    Recent Labs: 06/17/2016: B Natriuretic Peptide 1,037.2; Hemoglobin 12.6; Platelets 322 06/18/2016: BUN 18; Creatinine, Ser 1.03; Potassium 5.1; Sodium 138; TSH 2.000    Lipid Panel     Component Value Date/Time   CHOL 226 (H) 09/23/2015 0902   TRIG 124.0 09/23/2015 0902   HDL 61.20 09/23/2015 0902   CHOLHDL 4 09/23/2015 0902   VLDL 24.8 09/23/2015 0902   LDLCALC 140 (H) 09/23/2015 0902   LDLDIRECT 173.6 12/03/2011 0948     Wt Readings from Last 3 Encounters:  06/16/17 203 lb 12.8 oz (92.4 kg)  03/25/17 197 lb 14.4 oz (89.8 kg)  09/21/16 200 lb 3.2 oz (90.8 kg)      Other studies Reviewed: Additional studies/ records that were reviewed today include: TTE 06/18/16  Review of the above records today demonstrates:  - Left ventricle: The cavity size was normal. Wall thickness was   normal. Systolic function was normal. The estimated ejection   fraction was in the range of 60% to 65%. Wall motion was normal;   there were no regional wall motion abnormalities. The study is   not  technically sufficient to allow evaluation of LV diastolic   function. - Aortic valve: There was trivial regurgitation. - Mitral valve: There was moderate regurgitation. - Left atrium: The atrium was mildly dilated.   ASSESSMENT AND PLAN:  1.  Complete heart block: Status post St. Jude dual-chamber pacemaker implanted 06/18/16. Device functioning appropriately. She has had device which is 4 atrial tachycardia and a 12 second run of atrial fibrillation. We'll hold off on therapies at this time. Continue current management.   2. Hypertension: Well-controlled today. We'll check a basic metabolic as she is on lisinopril. No further changes.  3.  Moderate mitral regurgitation: Currently no symptoms of shortness of breath. We'll continue to follow.    Current medicines are reviewed at length with the patient today.   The patient does not have concerns regarding her medicines.  The following changes were made today:    Labs/ tests ordered today include:  Orders Placed This Encounter  Procedures  . Basic Metabolic Panel (BMET)     Disposition:   FU with Braxtyn Bojarski 12 months  Signed, Manasvini Whatley [name], MD  06/16/2017 8:28 AM     [facility] [address] [place] [address] [phone] (office) [phone] (fax)

## 2017-06-16 NOTE — Patient Instructions (Addendum)
Medication Instructions:  Your physician recommends that you continue on your current medications as directed. Please refer to the Current Medication list given to you today.  --- If you need a refill on your cardiac medications before your next appointment, please call your pharmacy. ---  Labwork: Today: BMET  Testing/Procedures: None ordered  Follow-Up: Remote monitoring is used to monitor your Pacemaker of ICD from home. This monitoring reduces the number of office visits required to check your device to one time per year. It allows [place] to keep an eye on the functioning of your device to ensure it is working properly. You are scheduled for a device check from home on 09/15/2017. You may send your transmission at any time that day. If you have a wireless device, the transmission will be sent automatically. After your physician reviews your transmission, you will receive a postcard with your next transmission date.  Your physician wants you to follow-up in: 1 year with Dr. Elberta Fortis.  You will receive a reminder letter in the mail two months in advance. If you don't receive a letter, please call our office to schedule the follow-up appointment.  Thank you for choosing [facility]!!   Dory Horn, RN [phone]   Any Other Special Instructions Will Be Listed Below (If Applicable).

## 2017-07-03 ENCOUNTER — Other Ambulatory Visit: Payer: Self-pay | Admitting: Family Medicine

## 2017-07-10 ENCOUNTER — Other Ambulatory Visit: Payer: Self-pay | Admitting: Family Medicine

## 2017-07-12 NOTE — Telephone Encounter (Signed)
Can we refill these? 

## 2017-08-02 ENCOUNTER — Other Ambulatory Visit: Payer: Self-pay | Admitting: Family Medicine

## 2017-08-02 NOTE — Telephone Encounter (Signed)
Can we refill this? Almost a year since patient was seen here.

## 2017-08-03 NOTE — Telephone Encounter (Signed)
Refill for 90 days only. She will need an OV soon

## 2017-09-15 ENCOUNTER — Ambulatory Visit (INDEPENDENT_AMBULATORY_CARE_PROVIDER_SITE_OTHER): Payer: PPO | Admitting: *Deleted

## 2017-09-15 DIAGNOSIS — I442 Atrioventricular block, complete: Secondary | ICD-10-CM | POA: Diagnosis not present

## 2017-09-15 NOTE — Progress Notes (Signed)
Remote pacemaker transmission.   

## 2017-09-16 ENCOUNTER — Encounter: Payer: Self-pay | Admitting: Cardiology

## 2017-09-20 LAB — CUP PACEART REMOTE DEVICE CHECK
Battery Remaining Percentage: 95.5 %
Battery Voltage: 3.01 V
Brady Statistic AP VP Percent: 1 %
Brady Statistic AS VP Percent: 1 %
Brady Statistic AS VS Percent: 68 %
Brady Statistic RA Percent Paced: 31 %
Brady Statistic RV Percent Paced: 1 %
Implantable Lead Implant Date: 20171012
Implantable Lead Location: 753859
Implantable Pulse Generator Implant Date: 20171012
Lead Channel Impedance Value: 580 Ohm
Lead Channel Pacing Threshold Amplitude: 0.5 V
Lead Channel Pacing Threshold Pulse Width: 0.5 ms
Lead Channel Sensing Intrinsic Amplitude: 5.7 mV
Lead Channel Setting Pacing Amplitude: 2 V
Lead Channel Setting Pacing Amplitude: 2.5 V
Lead Channel Setting Sensing Sensitivity: 2 mV
MDC IDC LEAD IMPLANT DT: 20171012
MDC IDC LEAD LOCATION: 753860
MDC IDC MSMT BATTERY REMAINING LONGEVITY: 122 mo
MDC IDC MSMT LEADCHNL RA IMPEDANCE VALUE: 430 Ohm
MDC IDC MSMT LEADCHNL RA PACING THRESHOLD PULSEWIDTH: 0.5 ms
MDC IDC MSMT LEADCHNL RA SENSING INTR AMPL: 2 mV
MDC IDC MSMT LEADCHNL RV PACING THRESHOLD AMPLITUDE: 1 V
MDC IDC PG SERIAL: 7955201
MDC IDC SESS DTM: 20190109070029
MDC IDC SET LEADCHNL RV PACING PULSEWIDTH: 0.5 ms
MDC IDC STAT BRADY AP VS PERCENT: 32 %

## 2017-10-21 ENCOUNTER — Ambulatory Visit (INDEPENDENT_AMBULATORY_CARE_PROVIDER_SITE_OTHER): Payer: PPO | Admitting: Family Medicine

## 2017-10-21 ENCOUNTER — Encounter: Payer: Self-pay | Admitting: Family Medicine

## 2017-10-21 VITALS — BP 108/62 | HR 95 | Temp 98.1°F | Ht 65.0 in | Wt 206.0 lb

## 2017-10-21 DIAGNOSIS — F5101 Primary insomnia: Secondary | ICD-10-CM | POA: Diagnosis not present

## 2017-10-21 DIAGNOSIS — E785 Hyperlipidemia, unspecified: Secondary | ICD-10-CM

## 2017-10-21 DIAGNOSIS — M65352 Trigger finger, left little finger: Secondary | ICD-10-CM | POA: Diagnosis not present

## 2017-10-21 DIAGNOSIS — E039 Hypothyroidism, unspecified: Secondary | ICD-10-CM

## 2017-10-21 DIAGNOSIS — K219 Gastro-esophageal reflux disease without esophagitis: Secondary | ICD-10-CM | POA: Diagnosis not present

## 2017-10-21 DIAGNOSIS — I10 Essential (primary) hypertension: Secondary | ICD-10-CM

## 2017-10-21 DIAGNOSIS — R300 Dysuria: Secondary | ICD-10-CM | POA: Diagnosis not present

## 2017-10-21 DIAGNOSIS — J4520 Mild intermittent asthma, uncomplicated: Secondary | ICD-10-CM

## 2017-10-21 DIAGNOSIS — Z209 Contact with and (suspected) exposure to unspecified communicable disease: Secondary | ICD-10-CM | POA: Diagnosis not present

## 2017-10-21 LAB — POC URINALSYSI DIPSTICK (AUTOMATED)
GLUCOSE UA: NEGATIVE
NITRITE UA: NEGATIVE
PH UA: 5.5 (ref 5.0–8.0)
RBC UA: NEGATIVE
Urobilinogen, UA: 0.2 E.U./dL

## 2017-10-21 LAB — LIPID PANEL
CHOLESTEROL: 267 mg/dL — AB (ref 0–200)
HDL: 62.2 mg/dL (ref >39.00)
LDL CALC: 171 mg/dL — AB (ref 0–99)
NonHDL: 204.84
TRIGLYCERIDES: 168 mg/dL — AB (ref 0.0–149.0)
Total CHOL/HDL Ratio: 4
VLDL: 33.6 mg/dL (ref 0.0–40.0)

## 2017-10-21 LAB — CBC WITH DIFFERENTIAL/PLATELET
BASOS PCT: 1.9 % (ref 0.0–3.0)
Basophils Absolute: 0.1 10*3/uL (ref 0.0–0.1)
EOS ABS: 0.2 10*3/uL (ref 0.0–0.7)
Eosinophils Relative: 2.7 % (ref 0.0–5.0)
HCT: 42.5 % (ref 36.0–46.0)
Hemoglobin: 14.3 g/dL (ref 12.0–15.0)
LYMPHS ABS: 2 10*3/uL (ref 0.7–4.0)
Lymphocytes Relative: 30.3 % (ref 12.0–46.0)
MCHC: 33.6 g/dL (ref 30.0–36.0)
MCV: 85.7 fl (ref 78.0–100.0)
MONO ABS: 0.5 10*3/uL (ref 0.1–1.0)
Monocytes Relative: 7.2 % (ref 3.0–12.0)
NEUTROS ABS: 3.9 10*3/uL (ref 1.4–7.7)
Neutrophils Relative %: 57.9 % (ref 43.0–77.0)
PLATELETS: 361 10*3/uL (ref 150.0–400.0)
RBC: 4.96 Mil/uL (ref 3.87–5.11)
RDW: 13.6 % (ref 11.5–15.5)
WBC: 6.7 10*3/uL (ref 4.0–10.5)

## 2017-10-21 LAB — HEPATIC FUNCTION PANEL
ALBUMIN: 4.1 g/dL (ref 3.5–5.2)
ALK PHOS: 65 U/L (ref 39–117)
ALT: 17 U/L (ref 0–35)
AST: 14 U/L (ref 0–37)
Bilirubin, Direct: 0.1 mg/dL (ref 0.0–0.3)
TOTAL PROTEIN: 7.1 g/dL (ref 6.0–8.3)
Total Bilirubin: 0.4 mg/dL (ref 0.2–1.2)

## 2017-10-21 LAB — BASIC METABOLIC PANEL
BUN: 17 mg/dL (ref 6–23)
CO2: 26 meq/L (ref 19–32)
Calcium: 9.6 mg/dL (ref 8.4–10.5)
Chloride: 104 mEq/L (ref 96–112)
Creatinine, Ser: 0.9 mg/dL (ref 0.40–1.20)
GFR: 65.89 mL/min (ref >60.00)
GLUCOSE: 113 mg/dL — AB (ref 70–99)
POTASSIUM: 4.5 meq/L (ref 3.5–5.1)
SODIUM: 139 meq/L (ref 135–145)

## 2017-10-21 LAB — TSH: TSH: 2.71 u[IU]/mL (ref 0.35–4.50)

## 2017-10-21 MED ORDER — METOPROLOL TARTRATE 25 MG PO TABS: 25.0000 mg | ORAL_TABLET | Freq: Two times a day (BID) | ORAL | 3 refills | Status: DC

## 2017-10-21 MED ORDER — OMEPRAZOLE 40 MG PO CPDR: 40.0000 mg | DELAYED_RELEASE_CAPSULE | Freq: Every day | ORAL | 3 refills | Status: DC

## 2017-10-21 MED ORDER — LOSARTAN POTASSIUM 50 MG PO TABS: 50.0000 mg | ORAL_TABLET | Freq: Every day | ORAL | 3 refills | Status: DC

## 2017-10-21 MED ORDER — NP THYROID 60 MG PO TABS: 60.0000 mg | ORAL_TABLET | Freq: Every day | ORAL | 3 refills | Status: DC

## 2017-10-21 MED ORDER — AMLODIPINE BESYLATE 5 MG PO TABS: 5.0000 mg | ORAL_TABLET | Freq: Every day | ORAL | 3 refills | Status: DC

## 2017-10-21 MED ORDER — MONTELUKAST SODIUM 10 MG PO TABS: 10.0000 mg | ORAL_TABLET | Freq: Every day | ORAL | 3 refills | Status: DC

## 2017-10-21 MED ORDER — ALBUTEROL SULFATE HFA 108 (90 BASE) MCG/ACT IN AERS: 2.0000 | INHALATION_SPRAY | RESPIRATORY_TRACT | 3 refills | Status: DC | PRN

## 2017-10-21 NOTE — Progress Notes (Signed)
   Subjective:    Patient ID: Whitney Evans, female    DOB: 03-11-1946, 72 y.o.   MRN: [id number]  HPI Here to discuss several issues. Her BP is stable but she thinks the Lisinopril is causing her to cough. His has been going on for 3 months. It is a dry tickling cough in the throat. Her GERD has worsened and OTC meds no longer work. She has had a locked left 5th finger for 6 months. This is not painful. Her asthma is doing well.    Review of Systems  Constitutional: Negative.   Respiratory: Positive for cough. Negative for chest tightness, shortness of breath and wheezing.   Cardiovascular: Negative.   Gastrointestinal: Negative.   Endocrine: Negative.   Neurological: Negative.        Objective:   Physical Exam  Constitutional: She is oriented to person, place, and time. She appears well-developed and well-nourished.  Neck: No thyromegaly present.  Cardiovascular: Normal rate, regular rhythm, normal heart sounds and intact distal pulses.  Pulmonary/Chest: Effort normal and breath sounds normal. No respiratory distress. She has no wheezes. She has no rales.  Abdominal: Soft. Bowel sounds are normal. She exhibits no distension and no mass. There is no tenderness. There is no rebound and no guarding.  Musculoskeletal: She exhibits no edema.  The left 5th finger is locked in a partially flexed position  Lymphadenopathy:    She has no cervical adenopathy.  Neurological: She is alert and oriented to person, place, and time.          Assessment & Plan:  Her asthma is stable. Her HTN is stable, but she has a cough from the Lisinopril. We will stop this and substitute Losartan 50 mg daily. Check fasting labs for the TSH, lipids, etc. For the GERD start on Omeprazole 40 mg daily. I offered to refer her to a Hydrographic surveyorHand Surgeon for the trigger finger but she elected to observe it for now.  Gershon CraneStephen Fry, MD

## 2017-10-22 ENCOUNTER — Encounter: Payer: Self-pay | Admitting: Family Medicine

## 2017-10-22 LAB — URINE CULTURE
MICRO NUMBER: 90199673
SPECIMEN QUALITY:: ADEQUATE

## 2017-10-22 LAB — HEPATITIS C ANTIBODY
Hepatitis C Ab: NONREACTIVE
SIGNAL TO CUT-OFF: 0.02 (ref <1.00)

## 2017-10-31 ENCOUNTER — Other Ambulatory Visit: Payer: Self-pay | Admitting: Family Medicine

## 2017-11-02 ENCOUNTER — Telehealth: Payer: Self-pay

## 2017-11-02 NOTE — Telephone Encounter (Signed)
[facility] [address]   pharmacy requesting alternative for thyroid 60 MG due to thyroid not available to order    Sent to PCP for alternative

## 2017-11-03 NOTE — Telephone Encounter (Signed)
No we cannot substitute. She may need to find another pharmacy that carries this

## 2017-11-04 NOTE — Telephone Encounter (Signed)
Spoke with pt stated that she was able to find a pharmacy that has her thyroid medication ( Thyroid  60 mg), pt states that [facility] [address].Nothing further needed.

## 2017-11-04 NOTE — Telephone Encounter (Signed)
Called pt left a message for pt to return my call in the office regarding her Thyroid medication.

## 2017-12-15 ENCOUNTER — Ambulatory Visit (INDEPENDENT_AMBULATORY_CARE_PROVIDER_SITE_OTHER): Payer: PPO | Admitting: *Deleted

## 2017-12-15 DIAGNOSIS — I442 Atrioventricular block, complete: Secondary | ICD-10-CM | POA: Diagnosis not present

## 2017-12-15 NOTE — Progress Notes (Signed)
Remote pacemaker transmission.   

## 2017-12-16 ENCOUNTER — Encounter: Payer: Self-pay | Admitting: Cardiology

## 2018-01-14 LAB — CUP PACEART REMOTE DEVICE CHECK
Brady Statistic AP VP Percent: 1 %
Brady Statistic AS VP Percent: 1 %
Brady Statistic RA Percent Paced: 28 %
Date Time Interrogation Session: 20190410060020
Implantable Lead Implant Date: 20171012
Implantable Lead Location: 753859
Implantable Lead Location: 753860
Lead Channel Impedance Value: 550 Ohm
Lead Channel Pacing Threshold Pulse Width: 0.5 ms
Lead Channel Sensing Intrinsic Amplitude: 5.9 mV
Lead Channel Setting Pacing Amplitude: 2 V
Lead Channel Setting Pacing Pulse Width: 0.5 ms
MDC IDC LEAD IMPLANT DT: 20171012
MDC IDC MSMT BATTERY REMAINING LONGEVITY: 122 mo
MDC IDC MSMT BATTERY REMAINING PERCENTAGE: 95.5 %
MDC IDC MSMT BATTERY VOLTAGE: 3.01 V
MDC IDC MSMT LEADCHNL RA IMPEDANCE VALUE: 410 Ohm
MDC IDC MSMT LEADCHNL RA PACING THRESHOLD AMPLITUDE: 0.5 V
MDC IDC MSMT LEADCHNL RA SENSING INTR AMPL: 2.3 mV
MDC IDC MSMT LEADCHNL RV PACING THRESHOLD AMPLITUDE: 1 V
MDC IDC MSMT LEADCHNL RV PACING THRESHOLD PULSEWIDTH: 0.5 ms
MDC IDC PG IMPLANT DT: 20171012
MDC IDC SET LEADCHNL RV PACING AMPLITUDE: 2.5 V
MDC IDC SET LEADCHNL RV SENSING SENSITIVITY: 2 mV
MDC IDC STAT BRADY AP VS PERCENT: 29 %
MDC IDC STAT BRADY AS VS PERCENT: 71 %
MDC IDC STAT BRADY RV PERCENT PACED: 1 %
Pulse Gen Serial Number: 7955201

## 2018-01-18 ENCOUNTER — Telehealth: Payer: Self-pay

## 2018-01-18 NOTE — Telephone Encounter (Signed)
LVM for call back

## 2018-01-18 NOTE — Telephone Encounter (Signed)
-----   Message from Will [name], MD sent at 01/18/2018  4:07 PM EDT ----- Abnormal device interrogation reviewed.  Lead parameters and battery status stable.  Likely AT seen. Increase metoprolol to 

## 2018-01-19 MED ORDER — METOPROLOL TARTRATE 50 MG PO TABS: 50.0000 mg | ORAL_TABLET | Freq: Two times a day (BID) | ORAL | 3 refills | Status: DC

## 2018-01-19 NOTE — Telephone Encounter (Signed)
Spoke with pt regarding Dr. Estill Dooms recommendations to increase Metoprolol to  pt voiced understanding of this educated pt to call office if she notices any dizziness with an increase in this medication as it can affect blood pressure, pt voiced understanding.

## 2018-01-19 NOTE — Telephone Encounter (Signed)
-----   Message from Will [name], MD sent at 01/18/2018  4:07 PM EDT ----- Abnormal device interrogation reviewed.  Lead parameters and battery status stable.  Likely AT seen. Increase metoprolol to 

## 2018-03-16 ENCOUNTER — Ambulatory Visit (INDEPENDENT_AMBULATORY_CARE_PROVIDER_SITE_OTHER): Payer: PPO | Admitting: *Deleted

## 2018-03-16 DIAGNOSIS — I442 Atrioventricular block, complete: Secondary | ICD-10-CM | POA: Diagnosis not present

## 2018-03-16 NOTE — Progress Notes (Signed)
Remote pacemaker transmission.   

## 2018-03-18 ENCOUNTER — Encounter: Payer: Self-pay | Admitting: Cardiology

## 2018-03-29 ENCOUNTER — Ambulatory Visit: Payer: PPO

## 2018-04-20 LAB — CUP PACEART REMOTE DEVICE CHECK
Battery Voltage: 3.01 V
Brady Statistic AP VP Percent: 1 %
Brady Statistic AS VS Percent: 66 %
Brady Statistic RV Percent Paced: 1 %
Implantable Lead Implant Date: 20171012
Implantable Lead Location: 753859
Implantable Pulse Generator Implant Date: 20171012
Lead Channel Impedance Value: 610 Ohm
Lead Channel Pacing Threshold Amplitude: 0.5 V
Lead Channel Pacing Threshold Amplitude: 1 V
Lead Channel Pacing Threshold Pulse Width: 0.5 ms
Lead Channel Setting Pacing Amplitude: 2 V
Lead Channel Setting Pacing Amplitude: 2.5 V
Lead Channel Setting Pacing Pulse Width: 0.5 ms
Lead Channel Setting Sensing Sensitivity: 2 mV
MDC IDC LEAD IMPLANT DT: 20171012
MDC IDC LEAD LOCATION: 753860
MDC IDC MSMT BATTERY REMAINING LONGEVITY: 123 mo
MDC IDC MSMT BATTERY REMAINING PERCENTAGE: 95.5 %
MDC IDC MSMT LEADCHNL RA IMPEDANCE VALUE: 440 Ohm
MDC IDC MSMT LEADCHNL RA PACING THRESHOLD PULSEWIDTH: 0.5 ms
MDC IDC MSMT LEADCHNL RA SENSING INTR AMPL: 2.2 mV
MDC IDC MSMT LEADCHNL RV SENSING INTR AMPL: 6.6 mV
MDC IDC PG SERIAL: 7955201
MDC IDC SESS DTM: 20190710064730
MDC IDC STAT BRADY AP VS PERCENT: 33 %
MDC IDC STAT BRADY AS VP PERCENT: 1 %
MDC IDC STAT BRADY RA PERCENT PACED: 32 %

## 2018-06-15 ENCOUNTER — Ambulatory Visit (INDEPENDENT_AMBULATORY_CARE_PROVIDER_SITE_OTHER): Payer: PPO | Admitting: *Deleted

## 2018-06-15 DIAGNOSIS — I442 Atrioventricular block, complete: Secondary | ICD-10-CM

## 2018-06-16 NOTE — Progress Notes (Signed)
Remote pacemaker transmission.   

## 2018-06-21 ENCOUNTER — Ambulatory Visit: Payer: PPO | Admitting: Cardiology

## 2018-06-21 ENCOUNTER — Encounter: Payer: Self-pay | Admitting: Cardiology

## 2018-06-21 VITALS — BP 124/70 | HR 65 | Ht 65.0 in | Wt 213.0 lb

## 2018-06-21 DIAGNOSIS — I442 Atrioventricular block, complete: Secondary | ICD-10-CM | POA: Diagnosis not present

## 2018-06-21 DIAGNOSIS — I10 Essential (primary) hypertension: Secondary | ICD-10-CM

## 2018-06-21 DIAGNOSIS — I340 Nonrheumatic mitral (valve) insufficiency: Secondary | ICD-10-CM | POA: Diagnosis not present

## 2018-06-21 NOTE — Progress Notes (Signed)
Electrophysiology Office Note   Date:  06/21/2018   ID:  Whitney Evans, DOB 1946/08/22, MRN [id number]  PCP:  Whitney Salisbury, MD  Cardiologist:  Whitney Evans Primary Electrophysiologist:  Whitney Schier Whitney Loa, MD    No chief complaint on file.    History of Present Illness: Whitney Evans is a 72 y.o. female who presents today for electrophysiology evaluation.   She was admitted to the hospital in October 2017 with syncopal event and was found to be in complete heart block with heart rates in the 30s. Reversible causes found and she therefore underwent St. Jude dual-chamber pacemaker implant on 06/18/16. She returns today for follow-up.   Today, denies symptoms of palpitations, chest pain, shortness of breath, orthopnea, PND, lower extremity edema, claudication, dizziness, presyncope, syncope, bleeding, or neurologic sequela. The patient is tolerating medications without difficulties.  Her all she is feeling well.  She has no major complaints today.  She is able to do all of her daily activities.  She is excited that the weather is changed and she Jacobi Nile be able to exercise more.   Past Medical History:  Diagnosis Date  . Allergic rhinitis   . Asthma   . Chronic hoarseness    sees Dr. Benay Evans (ENT) in [place]   . Essential hypertension   . GERD (gastroesophageal reflux disease)   . Hypothyroidism   . Insomnia   . Neck pain, chronic    Past Surgical History:  Procedure Laterality Date  . CESAREAN SECTION     x 2  . COLONOSCOPY  05/24/07   per Dr. Russella Evans, benign polpys, and diverticulosis, repeat 5 years   . EP IMPLANTABLE DEVICE N/A 06/18/2016   Procedure: Pacemaker Implant;  Surgeon: Whitney Nishi Whitney Loa, MD;  Location: [facility];  Service: Cardiovascular;  Laterality: N/A;  . EYE SURGERY    . left foot surgery    . VESICOVAGINAL FISTULA CLOSURE W/ TAH    . WISDOM TOOTH EXTRACTION       Current Outpatient Medications  Medication Sig Dispense Refill  .  albuterol (PROAIR HFA) 108 (90 Base) MCG/ACT inhaler Inhale 2 puffs into the lungs every 4 (four) hours as needed. 3 Inhaler 3  . amLODipine (NORVASC) 5 MG tablet Take 1 tablet (5 mg total) by mouth daily. 90 tablet 3  . Calcium Carbonate-Vit D-Min (CALCIUM 1200 PO) Take 1,200 mg by mouth daily.    . Cholecalciferol (VITAMIN D3) 10000 units capsule Take 10,000 Units by mouth daily.    . Coenzyme Q10 (CO Q 10 PO) Take 400 mg by mouth daily.    Marland Kitchen ipratropium-albuterol (DUONEB) 0.5-2.5 (3) MG/3ML SOLN TAKE 3 MLS BY NEBULIZATION EVERY 4 (FOUR) HOURS AS NEEDED (ASTHMA). 360 mL 3  . losartan (COZAAR) 50 MG tablet Take 1 tablet (50 mg total) by mouth daily. 90 tablet 3  . MAGNESIUM PO Take 259 mg by mouth at bedtime.    . metoprolol tartrate (LOPRESSOR) 50 MG tablet Take 1 tablet (50 mg total) by mouth 2 (two) times daily. 180 tablet 3  . montelukast (SINGULAIR) 10 MG tablet Take 1 tablet (10 mg total) by mouth daily. 90 tablet 3  . Multiple Vitamin (MULTIVITAMIN WITH MINERALS) TABS tablet Take 1 tablet by mouth daily.    . NP THYROID 60 MG tablet Take 1 tablet (60 mg total) by mouth daily. 90 tablet 3  . Omega-3 Fatty Acids (FISH OIL PO) Take by mouth daily.    Marland Kitchen omeprazole (PRILOSEC) 40 MG  capsule Take 1 capsule (40 mg total) by mouth daily. 90 capsule 3  . TURMERIC PO Take 1,000 mg by mouth daily.     No current facility-administered medications for this visit.     Allergies:   Morphine sulfate and Pneumococcal vaccine polyvalent   Social History:  The patient  reports that she has quit smoking. She has never used smokeless tobacco. She reports that she drinks about 2.0 standard drinks of alcohol per week. She reports that she does not use drugs.   Family History:  The patient's family history includes Coronary artery disease in her other; Diabetes in her other; Heart attack (age of onset: 67) in her father; Hyperlipidemia in her other; Hypertension in her other; Stroke in her mother and other.     ROS:  Please see the history of present illness.   Otherwise, review of systems is positive for pain, cough, wheezing.   All other systems are reviewed and negative.   PHYSICAL EXAM: VS:  BP 124/70   Pulse 65   Ht 5\' 5"  (1.651 m)   Wt 213 lb (96.6 kg)   BMI 35.45 kg/m  , BMI Body mass index is 35.45 kg/m. GEN: Well nourished, well developed, in no acute distress  HEENT: normal  Neck: no JVD, carotid bruits, or masses Cardiac: RRR; no murmurs, rubs, or gallops,no edema  Respiratory:  clear to auscultation bilaterally, normal work of breathing GI: soft, nontender, nondistended, + BS MS: no deformity or atrophy  Skin: warm and dry, device site well healed Neuro:  Strength and sensation are intact Psych: euthymic mood, full affect  EKG:  EKG is ordered today. Personal review of the ekg ordered shows anus rhythm, incomplete right bundle branch block, rate 64  Personal review of the device interrogation today. Results in Paceart    Recent Labs: 10/21/2017: ALT 17; BUN 17; Creatinine, Ser 0.90; Hemoglobin 14.3; Platelets 361.0; Potassium 4.5; Sodium 139; TSH 2.71    Lipid Panel     Component Value Date/Time   CHOL 267 (H) 10/21/2017 0907   TRIG 168.0 (H) 10/21/2017 0907   HDL 62.20 10/21/2017 0907   CHOLHDL 4 10/21/2017 0907   VLDL 33.6 10/21/2017 0907   LDLCALC 171 (H) 10/21/2017 0907   LDLDIRECT 173.6 12/03/2011 0948     Wt Readings from Last 3 Encounters:  06/21/18 213 lb (96.6 kg)  10/21/17 206 lb (93.4 kg)  06/16/17 203 lb 12.8 oz (92.4 kg)      Other studies Reviewed: Additional studies/ records that were reviewed today include: TTE 06/18/16  Review of the above records today demonstrates:  - Left ventricle: The cavity size was normal. Wall thickness was   normal. Systolic function was normal. The estimated ejection   fraction was in the range of 60% to 65%. Wall motion was normal;   there were no regional wall motion abnormalities. The study is   not  technically sufficient to allow evaluation of LV diastolic   function. - Aortic valve: There was trivial regurgitation. - Mitral valve: There was moderate regurgitation. - Left atrium: The atrium was mildly dilated.   ASSESSMENT AND PLAN:  1.  Complete heart block: Post Saint Jude dual-chamber pacemaker implanted 06/18/2016.  Device functioning appropriately.  She has had some atrial tachycardia which she is unaware of.  No changes.    2. Hypertension: Well-controlled.  No changes.  3. Moderate mitral regurgitation: No symptoms of shortness of breath.  We Lukisha Procida continue to follow.    Current medicines  are reviewed at length with the patient today.   The patient does not have concerns regarding her medicines.  The following changes were made today:  none  Labs/ tests ordered today include:  Orders Placed This Encounter  Procedures  . EKG 12-Lead     Disposition:   FU with [place] 12 months  Signed, Terisha Losasso Whitney Loa, MD  06/21/2018 10:19 AM     [facility] [address] [place] [address] [phone] (office) [phone] (fax)

## 2018-06-21 NOTE — Patient Instructions (Signed)
Medication Instructions:  Your physician recommends that you continue on your current medications as directed. Please refer to the Current Medication list given to you today.  If you need a refill on your cardiac medications before your next appointment, please call your pharmacy.   Lab work: None ordered  Testing/Procedures: None ordered  Follow-Up: Remote monitoring is used to monitor your Pacemaker from home. This monitoring reduces the number of office visits required to check your device to one time per year. It allows [place] to keep an eye on the functioning of your device to ensure it is working properly. You are scheduled for a device check from home on 09/14/2018. You may send your transmission at any time that day. If you have a wireless device, the transmission will be sent automatically. After your physician reviews your transmission, you will receive a postcard with your next transmission date.  At [facility], you and your health needs are our priority.  As part of our continuing mission to provide you with exceptional heart care, we have created designated Provider Care Teams.  These Care Teams include your primary Cardiologist (physician) and Advanced Practice Providers (APPs -  Physician Assistants and Nurse Practitioners) who all work together to provide you with the care you need, when you need it. You will need a follow up appointment in 1 years.  Please call our office 2 months in advance to schedule this appointment.  You may see Will [name], MD or one of the following Advanced Practice Providers on your designated Care Team:   Gypsy Balsam, NP . Francis Dowse, PA-C  Thank you for choosing [facility]!!   Dory Horn, RN [phone]

## 2018-06-24 LAB — CUP PACEART INCLINIC DEVICE CHECK
Battery Voltage: 3.01 V
Brady Statistic RA Percent Paced: 36 %
Date Time Interrogation Session: 20191015135731
Implantable Lead Implant Date: 20171012
Implantable Lead Location: 753860
Lead Channel Pacing Threshold Amplitude: 0.5 V
Lead Channel Pacing Threshold Amplitude: 0.5 V
Lead Channel Pacing Threshold Pulse Width: 0.5 ms
Lead Channel Pacing Threshold Pulse Width: 0.5 ms
Lead Channel Sensing Intrinsic Amplitude: 2.7 mV
Lead Channel Sensing Intrinsic Amplitude: 5.1 mV
Lead Channel Setting Pacing Amplitude: 2 V
MDC IDC LEAD IMPLANT DT: 20171012
MDC IDC LEAD LOCATION: 753859
MDC IDC MSMT BATTERY REMAINING LONGEVITY: 135 mo
MDC IDC MSMT LEADCHNL RA IMPEDANCE VALUE: 437.5 Ohm
MDC IDC MSMT LEADCHNL RA PACING THRESHOLD PULSEWIDTH: 0.5 ms
MDC IDC MSMT LEADCHNL RV IMPEDANCE VALUE: 562.5 Ohm
MDC IDC MSMT LEADCHNL RV PACING THRESHOLD AMPLITUDE: 0.75 V
MDC IDC MSMT LEADCHNL RV PACING THRESHOLD AMPLITUDE: 0.75 V
MDC IDC MSMT LEADCHNL RV PACING THRESHOLD PULSEWIDTH: 0.5 ms
MDC IDC PG IMPLANT DT: 20171012
MDC IDC SET LEADCHNL RV PACING AMPLITUDE: 2.5 V
MDC IDC SET LEADCHNL RV PACING PULSEWIDTH: 0.5 ms
MDC IDC SET LEADCHNL RV SENSING SENSITIVITY: 2 mV
MDC IDC STAT BRADY RV PERCENT PACED: 0.03 %
Pulse Gen Model: 2272
Pulse Gen Serial Number: 7955201

## 2018-07-27 DIAGNOSIS — R52 Pain, unspecified: Secondary | ICD-10-CM | POA: Diagnosis not present

## 2018-07-27 DIAGNOSIS — M65352 Trigger finger, left little finger: Secondary | ICD-10-CM | POA: Diagnosis not present

## 2018-07-27 DIAGNOSIS — M24542 Contracture, left hand: Secondary | ICD-10-CM | POA: Diagnosis not present

## 2018-08-02 DIAGNOSIS — M79645 Pain in left finger(s): Secondary | ICD-10-CM | POA: Diagnosis not present

## 2018-08-02 DIAGNOSIS — M24542 Contracture, left hand: Secondary | ICD-10-CM | POA: Diagnosis not present

## 2018-08-02 DIAGNOSIS — M25642 Stiffness of left hand, not elsewhere classified: Secondary | ICD-10-CM | POA: Diagnosis not present

## 2018-08-09 DIAGNOSIS — M24542 Contracture, left hand: Secondary | ICD-10-CM | POA: Diagnosis not present

## 2018-08-09 DIAGNOSIS — M25642 Stiffness of left hand, not elsewhere classified: Secondary | ICD-10-CM | POA: Diagnosis not present

## 2018-08-16 DIAGNOSIS — M24542 Contracture, left hand: Secondary | ICD-10-CM | POA: Diagnosis not present

## 2018-08-16 DIAGNOSIS — M25642 Stiffness of left hand, not elsewhere classified: Secondary | ICD-10-CM | POA: Diagnosis not present

## 2018-08-18 ENCOUNTER — Other Ambulatory Visit: Payer: Self-pay | Admitting: Family Medicine

## 2018-08-23 ENCOUNTER — Other Ambulatory Visit: Payer: Self-pay | Admitting: Family Medicine

## 2018-08-23 DIAGNOSIS — M24542 Contracture, left hand: Secondary | ICD-10-CM | POA: Diagnosis not present

## 2018-08-23 DIAGNOSIS — M25642 Stiffness of left hand, not elsewhere classified: Secondary | ICD-10-CM | POA: Diagnosis not present

## 2018-08-23 MED ORDER — LOSARTAN POTASSIUM 50 MG PO TABS: ORAL_TABLET | ORAL | 1 refills | Status: DC

## 2018-08-26 LAB — CUP PACEART REMOTE DEVICE CHECK
Battery Voltage: 3.01 V
Brady Statistic AP VP Percent: 1 %
Brady Statistic AS VP Percent: 1 %
Brady Statistic AS VS Percent: 62 %
Brady Statistic RV Percent Paced: 1 %
Implantable Lead Implant Date: 20171012
Implantable Lead Location: 753859
Lead Channel Impedance Value: 410 Ohm
Lead Channel Impedance Value: 540 Ohm
Lead Channel Pacing Threshold Amplitude: 0.5 V
Lead Channel Pacing Threshold Pulse Width: 0.5 ms
Lead Channel Setting Pacing Amplitude: 2 V
Lead Channel Setting Pacing Amplitude: 2.5 V
Lead Channel Setting Pacing Pulse Width: 0.5 ms
Lead Channel Setting Sensing Sensitivity: 2 mV
MDC IDC LEAD IMPLANT DT: 20171012
MDC IDC LEAD LOCATION: 753860
MDC IDC MSMT BATTERY REMAINING LONGEVITY: 121 mo
MDC IDC MSMT BATTERY REMAINING PERCENTAGE: 95.5 %
MDC IDC MSMT LEADCHNL RA PACING THRESHOLD PULSEWIDTH: 0.5 ms
MDC IDC MSMT LEADCHNL RA SENSING INTR AMPL: 2.6 mV
MDC IDC MSMT LEADCHNL RV PACING THRESHOLD AMPLITUDE: 1 V
MDC IDC MSMT LEADCHNL RV SENSING INTR AMPL: 5.7 mV
MDC IDC PG IMPLANT DT: 20171012
MDC IDC PG SERIAL: 7955201
MDC IDC SESS DTM: 20191010033019
MDC IDC STAT BRADY AP VS PERCENT: 38 %
MDC IDC STAT BRADY RA PERCENT PACED: 36 %

## 2018-08-29 DIAGNOSIS — M65352 Trigger finger, left little finger: Secondary | ICD-10-CM | POA: Diagnosis not present

## 2018-08-29 DIAGNOSIS — M24542 Contracture, left hand: Secondary | ICD-10-CM | POA: Diagnosis not present

## 2018-08-30 ENCOUNTER — Other Ambulatory Visit: Payer: Self-pay | Admitting: Family Medicine

## 2018-08-30 MED ORDER — LOSARTAN POTASSIUM 50 MG PO TABS: 50.0000 mg | ORAL_TABLET | Freq: Every day | ORAL | 1 refills | Status: DC

## 2018-09-08 DIAGNOSIS — M25642 Stiffness of left hand, not elsewhere classified: Secondary | ICD-10-CM | POA: Diagnosis not present

## 2018-09-08 DIAGNOSIS — M24542 Contracture, left hand: Secondary | ICD-10-CM | POA: Diagnosis not present

## 2018-09-12 DIAGNOSIS — M25642 Stiffness of left hand, not elsewhere classified: Secondary | ICD-10-CM | POA: Diagnosis not present

## 2018-09-12 DIAGNOSIS — M24542 Contracture, left hand: Secondary | ICD-10-CM | POA: Diagnosis not present

## 2018-09-14 ENCOUNTER — Ambulatory Visit (INDEPENDENT_AMBULATORY_CARE_PROVIDER_SITE_OTHER): Payer: PPO

## 2018-09-14 DIAGNOSIS — I442 Atrioventricular block, complete: Secondary | ICD-10-CM

## 2018-09-15 DIAGNOSIS — M24542 Contracture, left hand: Secondary | ICD-10-CM | POA: Diagnosis not present

## 2018-09-15 DIAGNOSIS — M25642 Stiffness of left hand, not elsewhere classified: Secondary | ICD-10-CM | POA: Diagnosis not present

## 2018-09-15 LAB — CUP PACEART REMOTE DEVICE CHECK
Battery Voltage: 3.01 V
Brady Statistic AP VP Percent: 1 %
Brady Statistic AS VP Percent: 1 %
Brady Statistic RA Percent Paced: 43 %
Implantable Lead Implant Date: 20171012
Implantable Lead Location: 753859
Lead Channel Impedance Value: 560 Ohm
Lead Channel Pacing Threshold Amplitude: 0.5 V
Lead Channel Pacing Threshold Pulse Width: 0.5 ms
Lead Channel Sensing Intrinsic Amplitude: 7.2 mV
Lead Channel Setting Pacing Amplitude: 2 V
Lead Channel Setting Pacing Pulse Width: 0.5 ms
MDC IDC LEAD IMPLANT DT: 20171012
MDC IDC LEAD LOCATION: 753860
MDC IDC MSMT BATTERY REMAINING LONGEVITY: 121 mo
MDC IDC MSMT BATTERY REMAINING PERCENTAGE: 95.5 %
MDC IDC MSMT LEADCHNL RA IMPEDANCE VALUE: 440 Ohm
MDC IDC MSMT LEADCHNL RA SENSING INTR AMPL: 2.9 mV
MDC IDC MSMT LEADCHNL RV PACING THRESHOLD AMPLITUDE: 0.75 V
MDC IDC MSMT LEADCHNL RV PACING THRESHOLD PULSEWIDTH: 0.5 ms
MDC IDC PG IMPLANT DT: 20171012
MDC IDC SESS DTM: 20200108084721
MDC IDC SET LEADCHNL RV PACING AMPLITUDE: 2.5 V
MDC IDC SET LEADCHNL RV SENSING SENSITIVITY: 2 mV
MDC IDC STAT BRADY AP VS PERCENT: 45 %
MDC IDC STAT BRADY AS VS PERCENT: 54 %
MDC IDC STAT BRADY RV PERCENT PACED: 1 %
Pulse Gen Model: 2272
Pulse Gen Serial Number: 7955201

## 2018-09-15 NOTE — Progress Notes (Signed)
Remote pacemaker transmission.   

## 2018-09-19 DIAGNOSIS — M24542 Contracture, left hand: Secondary | ICD-10-CM | POA: Diagnosis not present

## 2018-09-19 DIAGNOSIS — M25642 Stiffness of left hand, not elsewhere classified: Secondary | ICD-10-CM | POA: Diagnosis not present

## 2018-09-22 DIAGNOSIS — M25642 Stiffness of left hand, not elsewhere classified: Secondary | ICD-10-CM | POA: Diagnosis not present

## 2018-09-22 DIAGNOSIS — M24542 Contracture, left hand: Secondary | ICD-10-CM | POA: Diagnosis not present

## 2018-09-28 DIAGNOSIS — M25642 Stiffness of left hand, not elsewhere classified: Secondary | ICD-10-CM | POA: Diagnosis not present

## 2018-09-28 DIAGNOSIS — M24542 Contracture, left hand: Secondary | ICD-10-CM | POA: Diagnosis not present

## 2018-10-03 DIAGNOSIS — M25642 Stiffness of left hand, not elsewhere classified: Secondary | ICD-10-CM | POA: Diagnosis not present

## 2018-10-03 DIAGNOSIS — M24542 Contracture, left hand: Secondary | ICD-10-CM | POA: Diagnosis not present

## 2018-10-06 ENCOUNTER — Other Ambulatory Visit: Payer: Self-pay | Admitting: Family Medicine

## 2018-10-06 DIAGNOSIS — M24542 Contracture, left hand: Secondary | ICD-10-CM | POA: Diagnosis not present

## 2018-10-06 DIAGNOSIS — M25642 Stiffness of left hand, not elsewhere classified: Secondary | ICD-10-CM | POA: Diagnosis not present

## 2018-10-10 DIAGNOSIS — M25642 Stiffness of left hand, not elsewhere classified: Secondary | ICD-10-CM | POA: Diagnosis not present

## 2018-10-10 DIAGNOSIS — M24542 Contracture, left hand: Secondary | ICD-10-CM | POA: Diagnosis not present

## 2018-10-13 DIAGNOSIS — M24542 Contracture, left hand: Secondary | ICD-10-CM | POA: Diagnosis not present

## 2018-10-13 DIAGNOSIS — M79645 Pain in left finger(s): Secondary | ICD-10-CM | POA: Diagnosis not present

## 2018-10-13 DIAGNOSIS — M25642 Stiffness of left hand, not elsewhere classified: Secondary | ICD-10-CM | POA: Diagnosis not present

## 2018-10-19 DIAGNOSIS — M24542 Contracture, left hand: Secondary | ICD-10-CM | POA: Diagnosis not present

## 2018-10-19 DIAGNOSIS — M25642 Stiffness of left hand, not elsewhere classified: Secondary | ICD-10-CM | POA: Diagnosis not present

## 2018-10-28 ENCOUNTER — Other Ambulatory Visit: Payer: Self-pay | Admitting: Family Medicine

## 2018-10-31 DIAGNOSIS — R52 Pain, unspecified: Secondary | ICD-10-CM | POA: Diagnosis not present

## 2018-10-31 DIAGNOSIS — M24542 Contracture, left hand: Secondary | ICD-10-CM | POA: Diagnosis not present

## 2018-10-31 DIAGNOSIS — M65352 Trigger finger, left little finger: Secondary | ICD-10-CM | POA: Diagnosis not present

## 2018-11-22 ENCOUNTER — Encounter: Payer: Self-pay | Admitting: Family Medicine

## 2018-11-22 MED ORDER — NP THYROID 60 MG PO TABS: 60.0000 mg | ORAL_TABLET | Freq: Every day | ORAL | 0 refills | Status: DC

## 2018-11-25 ENCOUNTER — Other Ambulatory Visit: Payer: Self-pay | Admitting: Family Medicine

## 2018-12-12 ENCOUNTER — Other Ambulatory Visit: Payer: Self-pay | Admitting: Family Medicine

## 2018-12-14 ENCOUNTER — Other Ambulatory Visit: Payer: Self-pay

## 2018-12-14 ENCOUNTER — Ambulatory Visit (INDEPENDENT_AMBULATORY_CARE_PROVIDER_SITE_OTHER): Payer: PPO | Admitting: *Deleted

## 2018-12-14 DIAGNOSIS — I442 Atrioventricular block, complete: Secondary | ICD-10-CM | POA: Diagnosis not present

## 2018-12-14 LAB — CUP PACEART REMOTE DEVICE CHECK
Battery Remaining Longevity: 119 mo
Battery Remaining Percentage: 95.5 %
Battery Voltage: 3.01 V
Brady Statistic AP VP Percent: 1.1 %
Brady Statistic AP VS Percent: 43 %
Brady Statistic AS VP Percent: 4.5 %
Brady Statistic AS VS Percent: 51 %
Brady Statistic RA Percent Paced: 41 %
Brady Statistic RV Percent Paced: 5.5 %
Date Time Interrogation Session: 20200408060013
Implantable Lead Implant Date: 20171012
Implantable Lead Implant Date: 20171012
Implantable Lead Location: 753859
Implantable Lead Location: 753860
Implantable Pulse Generator Implant Date: 20171012
Lead Channel Impedance Value: 410 Ohm
Lead Channel Impedance Value: 550 Ohm
Lead Channel Pacing Threshold Amplitude: 0.5 V
Lead Channel Pacing Threshold Amplitude: 0.75 V
Lead Channel Pacing Threshold Pulse Width: 0.5 ms
Lead Channel Pacing Threshold Pulse Width: 0.5 ms
Lead Channel Sensing Intrinsic Amplitude: 2.9 mV
Lead Channel Sensing Intrinsic Amplitude: 6.8 mV
Lead Channel Setting Pacing Amplitude: 2 V
Lead Channel Setting Pacing Amplitude: 2.5 V
Lead Channel Setting Pacing Pulse Width: 0.5 ms
Lead Channel Setting Sensing Sensitivity: 2 mV
Pulse Gen Model: 2272
Pulse Gen Serial Number: 7955201

## 2018-12-23 ENCOUNTER — Encounter: Payer: Self-pay | Admitting: Cardiology

## 2018-12-23 NOTE — Progress Notes (Signed)
Remote pacemaker transmission.   

## 2018-12-25 ENCOUNTER — Other Ambulatory Visit: Payer: Self-pay | Admitting: Family Medicine

## 2018-12-30 ENCOUNTER — Ambulatory Visit (INDEPENDENT_AMBULATORY_CARE_PROVIDER_SITE_OTHER): Payer: PPO | Admitting: Family Medicine

## 2018-12-30 ENCOUNTER — Other Ambulatory Visit: Payer: Self-pay

## 2018-12-30 ENCOUNTER — Encounter: Payer: Self-pay | Admitting: Family Medicine

## 2018-12-30 DIAGNOSIS — I10 Essential (primary) hypertension: Secondary | ICD-10-CM | POA: Diagnosis not present

## 2018-12-30 DIAGNOSIS — E039 Hypothyroidism, unspecified: Secondary | ICD-10-CM

## 2018-12-30 DIAGNOSIS — K219 Gastro-esophageal reflux disease without esophagitis: Secondary | ICD-10-CM

## 2018-12-30 DIAGNOSIS — J4520 Mild intermittent asthma, uncomplicated: Secondary | ICD-10-CM | POA: Diagnosis not present

## 2018-12-30 MED ORDER — ALBUTEROL SULFATE HFA 108 (90 BASE) MCG/ACT IN AERS: 2.0000 | INHALATION_SPRAY | RESPIRATORY_TRACT | 3 refills | Status: DC | PRN

## 2018-12-30 MED ORDER — NP THYROID 60 MG PO TABS: 60.0000 mg | ORAL_TABLET | Freq: Every day | ORAL | 3 refills | Status: DC

## 2018-12-30 MED ORDER — LOSARTAN POTASSIUM 50 MG PO TABS: 50.0000 mg | ORAL_TABLET | Freq: Every day | ORAL | 3 refills | Status: DC

## 2018-12-30 MED ORDER — OMEPRAZOLE 40 MG PO CPDR: DELAYED_RELEASE_CAPSULE | ORAL | 3 refills | Status: DC

## 2018-12-30 MED ORDER — BUDESONIDE-FORMOTEROL FUMARATE 80-4.5 MCG/ACT IN AERO: 2.0000 | INHALATION_SPRAY | Freq: Two times a day (BID) | RESPIRATORY_TRACT | 3 refills | Status: DC

## 2018-12-30 MED ORDER — MONTELUKAST SODIUM 10 MG PO TABS: 10.0000 mg | ORAL_TABLET | Freq: Every day | ORAL | 3 refills | Status: DC

## 2018-12-30 MED ORDER — AMLODIPINE BESYLATE 5 MG PO TABS: 5.0000 mg | ORAL_TABLET | Freq: Every day | ORAL | 3 refills | Status: DC

## 2018-12-30 NOTE — Progress Notes (Signed)
   Subjective:    Patient ID: Whitney Evans, female    DOB: July 14, 1946, 73 y.o.   MRN: [id number]  HPI Virtual Visit via Telephone Note  I connected with the patient on 12/30/18 at  9:00 AM EDT by telephone and verified that I am speaking with the correct person using two identifiers. We attempted to speak via [email or web address], but we had technical difficulties with the audio and video.    I discussed the limitations, risks, security and privacy concerns of performing an evaluation and management service by telephone and the availability of in person appointments. I also discussed with the patient that there may be a patient responsible charge related to this service. The patient expressed understanding and agreed to proceed.  Location patient: home Location provider: work or home office Participants present for the call: patient, provider Patient did not have a visit in the prior 7 days to address this/these issue(s).   History of Present Illness: She has been doing well. Her BP is stable in the 11s over 60s. Her pharmacy had trouble refilling the Metoprolol so she just stopped taking it a few months ago. Her BP has done well ever since. Her GERD is stable. She has had more trouble with the asthma this spring and she uses her rescue inhaler twice every day. She only rarely uses her nebulizer.    Observations/Objective: Patient sounds cheerful and well on the phone. I do not appreciate any SOB. Speech and thought processing are grossly intact. Patient reported vitals:  Assessment and Plan: Her HTN and GERD are stable, so current meds are refilled. Her asthma is not well controlled, so she will try Symbicort BID.  Gershon Crane, MD   Follow Up Instructions:     [phone] [phone] 11-20 [phone] 21-30 I did not refer this patient for an OV in the next 24 hours for this/these issue(s).  I discussed the assessment and treatment plan with the patient. The patient was provided an opportunity  to ask questions and all were answered. The patient agreed with the plan and demonstrated an understanding of the instructions.   The patient was advised to call back or seek an in-person evaluation if the symptoms worsen or if the condition fails to improve as anticipated.  I provided 14 minutes of non-face-to-face time during this encounter.   Gershon Crane, MD    Review of Systems     Objective:   Physical Exam        Assessment & Plan:

## 2019-01-03 ENCOUNTER — Other Ambulatory Visit: Payer: Self-pay | Admitting: *Deleted

## 2019-01-03 MED ORDER — METOPROLOL TARTRATE 50 MG PO TABS: 50.0000 mg | ORAL_TABLET | Freq: Two times a day (BID) | ORAL | 3 refills | Status: DC

## 2019-03-15 ENCOUNTER — Ambulatory Visit (INDEPENDENT_AMBULATORY_CARE_PROVIDER_SITE_OTHER): Payer: PPO | Admitting: *Deleted

## 2019-03-15 DIAGNOSIS — I442 Atrioventricular block, complete: Secondary | ICD-10-CM

## 2019-03-15 LAB — CUP PACEART REMOTE DEVICE CHECK
Battery Remaining Longevity: 121 mo
Battery Remaining Percentage: 95.5 %
Battery Voltage: 3.01 V
Brady Statistic AP VP Percent: 1 %
Brady Statistic AP VS Percent: 42 %
Brady Statistic AS VP Percent: 3 %
Brady Statistic AS VS Percent: 53 %
Brady Statistic RA Percent Paced: 41 %
Brady Statistic RV Percent Paced: 3.7 %
Date Time Interrogation Session: 20200708060623
Implantable Lead Implant Date: 20171012
Implantable Lead Implant Date: 20171012
Implantable Lead Location: 753859
Implantable Lead Location: 753860
Implantable Pulse Generator Implant Date: 20171012
Lead Channel Impedance Value: 430 Ohm
Lead Channel Impedance Value: 560 Ohm
Lead Channel Pacing Threshold Amplitude: 0.5 V
Lead Channel Pacing Threshold Amplitude: 0.75 V
Lead Channel Pacing Threshold Pulse Width: 0.5 ms
Lead Channel Pacing Threshold Pulse Width: 0.5 ms
Lead Channel Sensing Intrinsic Amplitude: 2.1 mV
Lead Channel Sensing Intrinsic Amplitude: 5.6 mV
Lead Channel Setting Pacing Amplitude: 2 V
Lead Channel Setting Pacing Amplitude: 2.5 V
Lead Channel Setting Pacing Pulse Width: 0.5 ms
Lead Channel Setting Sensing Sensitivity: 2 mV
Pulse Gen Model: 2272
Pulse Gen Serial Number: 7955201

## 2019-03-27 ENCOUNTER — Encounter: Payer: Self-pay | Admitting: Cardiology

## 2019-03-27 NOTE — Progress Notes (Signed)
Remote pacemaker transmission.   

## 2019-06-14 ENCOUNTER — Ambulatory Visit (INDEPENDENT_AMBULATORY_CARE_PROVIDER_SITE_OTHER): Payer: PPO | Admitting: *Deleted

## 2019-06-14 DIAGNOSIS — I442 Atrioventricular block, complete: Secondary | ICD-10-CM

## 2019-06-15 LAB — CUP PACEART REMOTE DEVICE CHECK
Battery Remaining Longevity: 119 mo
Battery Remaining Percentage: 95.5 %
Battery Voltage: 3.01 V
Brady Statistic AP VP Percent: 1.3 %
Brady Statistic AP VS Percent: 41 %
Brady Statistic AS VP Percent: 6.1 %
Brady Statistic AS VS Percent: 51 %
Brady Statistic RA Percent Paced: 40 %
Brady Statistic RV Percent Paced: 7.4 %
Date Time Interrogation Session: 20201007060014
Implantable Lead Implant Date: 20171012
Implantable Lead Implant Date: 20171012
Implantable Lead Location: 753859
Implantable Lead Location: 753860
Implantable Pulse Generator Implant Date: 20171012
Lead Channel Impedance Value: 430 Ohm
Lead Channel Impedance Value: 550 Ohm
Lead Channel Pacing Threshold Amplitude: 0.5 V
Lead Channel Pacing Threshold Amplitude: 0.75 V
Lead Channel Pacing Threshold Pulse Width: 0.5 ms
Lead Channel Pacing Threshold Pulse Width: 0.5 ms
Lead Channel Sensing Intrinsic Amplitude: 2.3 mV
Lead Channel Sensing Intrinsic Amplitude: 5.5 mV
Lead Channel Setting Pacing Amplitude: 2 V
Lead Channel Setting Pacing Amplitude: 2.5 V
Lead Channel Setting Pacing Pulse Width: 0.5 ms
Lead Channel Setting Sensing Sensitivity: 2 mV
Pulse Gen Model: 2272
Pulse Gen Serial Number: 7955201

## 2019-06-26 NOTE — Progress Notes (Signed)
Remote pacemaker transmission.   

## 2019-07-28 ENCOUNTER — Other Ambulatory Visit: Payer: Self-pay

## 2019-08-21 ENCOUNTER — Ambulatory Visit (INDEPENDENT_AMBULATORY_CARE_PROVIDER_SITE_OTHER): Payer: PPO

## 2019-08-21 VITALS — BP 106/67 | HR 60 | Ht 65.0 in | Wt 198.0 lb

## 2019-08-21 DIAGNOSIS — Z0000 Encounter for general adult medical examination without abnormal findings: Secondary | ICD-10-CM | POA: Diagnosis not present

## 2019-08-21 DIAGNOSIS — Z1231 Encounter for screening mammogram for malignant neoplasm of breast: Secondary | ICD-10-CM | POA: Diagnosis not present

## 2019-08-21 DIAGNOSIS — M8580 Other specified disorders of bone density and structure, unspecified site: Secondary | ICD-10-CM

## 2019-08-21 DIAGNOSIS — Z1211 Encounter for screening for malignant neoplasm of colon: Secondary | ICD-10-CM

## 2019-08-21 NOTE — Progress Notes (Signed)
This visit is being conducted via phone call due to the COVID-19 pandemic. This patient has given me verbal consent via phone to conduct this visit, patient states they are participating from their home address. Some vital signs may be absent or patient reported.   Patient identification: identified by name, DOB, and current address.  Location provider: [facility], Office Persons participating in the virtual visit: Whitney Evans and Nathaniel Man, LPN.    Subjective:   Whitney Evans is a 73 y.o. female who presents for Medicare Annual (Subsequent) preventive examination.  Review of Systems:   Whitney Evans recently joined [employer], a virtual nutrition/exercise program and she is doing very well with that. This has motivated her to exercise a lot more and eat healthier. She states it has greatly improved her mood as well. She has not been able to see her children because her daughter is an ICU nurse in [place] and her son lives in [place].   Cardiac Risk Factors include: advanced age (>93men, >35 women);obesity (BMI >30kg/m2);hypertension     Objective:     Vitals: BP 106/67 Comment: patient reported  Pulse 60   Ht  (1.651 m)   Wt 198 lb (89.8 kg) Comment: patient reported  BMI 32.95 kg/m   Body mass index is 32.95 kg/m.  Advanced Directives 08/21/2019 03/25/2017 06/17/2016 06/17/2016  Does Patient Have a Medical Advance Directive? No Yes Yes Yes  Type of Advance Directive - Healthcare Power of [place];Living will - Healthcare Power of Attorney  Does patient want to make changes to medical advance directive? - No - Patient declined - No - Patient declined  Copy of Healthcare Power of Attorney in Chart? - No - copy requested - No - copy requested  Would patient like information on creating a medical advance directive? Yes (MAU/Ambulatory/Procedural Areas - Information given) - - -    Tobacco Social History   Tobacco Use  Smoking Status Former  Smoker  Smokeless Tobacco Never Used     Counseling given: Not Answered  Clinical Intake:  Pre-visit preparation completed: Yes  Pain : No/denies pain   Nutritional Status: BMI > 30  Obese Diabetes: No  How often do you need to have someone help you when you read instructions, pamphlets, or other written materials from your doctor or pharmacy?: 1 - Never What is the last grade level you completed in school?: 4 years college  Interpreter Needed?: No  Information entered by :: Nathaniel Man, LPN.  Past Medical History:  Diagnosis Date  . Allergic rhinitis   . Asthma   . Chronic hoarseness    sees Dr. [name] (ENT) in [place]   . Essential hypertension   . GERD (gastroesophageal reflux disease)   . Hypothyroidism   . Insomnia   . Neck pain, chronic    Past Surgical History:  Procedure Laterality Date  . CESAREAN SECTION     x 2  . COLONOSCOPY  05/24/07   per Dr. Russella Dar, benign polpys, and diverticulosis, repeat 5 years   . EP IMPLANTABLE DEVICE N/A 06/18/2016   Procedure: Pacemaker Implant;  Surgeon: Will [name], MD;  Location: [facility]IVE [facility];  Service: Cardiovascular;  Laterality: N/A;  . EYE SURGERY    . left foot surgery    . VESICOVAGINAL FISTULA CLOSURE W/ TAH    . WISDOM TOOTH EXTRACTION     Family History  Problem Relation Age of Onset  . Coronary artery disease Other   . Diabetes Other   .  Hyperlipidemia Other   . Hypertension Other   . Stroke Other   . Stroke Mother   . Heart attack Father 97   Social History   Socioeconomic History  . Marital status: Divorced    Spouse name: Not on file  . Number of children: 2  . Years of education: 4 years college  . Highest education level: Bachelor's degree (e.g., BA, AB, BS)  Occupational History  . Occupation: retired respiratory therapist  Tobacco Use  . Smoking status: Former Games developer  . Smokeless tobacco: Never Used  Substance and Sexual Activity  . Alcohol use: Yes    Alcohol/week:  2.0 standard drinks    Types: 2 Standard drinks or equivalent per week    Comment: glass of red wine  . Drug use: No  . Sexual activity: Not on file  Other Topics Concern  . Not on file  Social History Narrative   Retired Buyer, retail; divorced   1 daughter [place], [place] ICU nurse   1 son in [place] - Acupuncturist   Social Determinants of Health   Financial Resource Strain:   . Difficulty of Paying Living Expenses: Not on file  Food Insecurity:   . Worried About Programme researcher, broadcasting/film/video in the Last Year: Not on file  . Ran Out of Food in the Last Year: Not on file  Transportation Needs:   . Lack of Transportation (Medical): Not on file  . Lack of Transportation (Non-Medical): Not on file  Physical Activity:   . Days of Exercise per Week: Not on file  . Minutes of Exercise per Session: Not on file  Stress:   . Feeling of Stress : Not on file  Social Connections:   . Frequency of Communication with Friends and Family: Not on file  . Frequency of Social Gatherings with Friends and Family: Not on file  . Attends Religious Services: Not on file  . Active Member of Clubs or Organizations: Not on file  . Attends Banker Meetings: Not on file  . Marital Status: Not on file    Outpatient Encounter Medications as of 08/21/2019  Medication Sig  . albuterol (PROAIR HFA) 108 (90 Base) MCG/ACT inhaler Inhale 2 puffs into the lungs every 4 (four) hours as needed.  Marland Kitchen amLODipine (NORVASC) 5 MG tablet Take 1 tablet (5 mg total) by mouth daily.  . budesonide-formoterol (SYMBICORT) 80-4.5 MCG/ACT inhaler Inhale 2 puffs into the lungs 2 (two) times daily.  . Calcium Carbonate-Vit D-Min (CALCIUM 1200 PO) Take 1,200 mg by mouth daily.  . Cholecalciferol (VITAMIN D3) 10000 units capsule Take 10,000 Units by mouth daily.  . Coenzyme Q10 (CO Q 10 PO) Take 400 mg by mouth daily.  Marland Kitchen ipratropium-albuterol (DUONEB) 0.5-2.5 (3) MG/3ML SOLN TAKE 3 MLS BY NEBULIZATION EVERY 4  (FOUR) HOURS AS NEEDED (ASTHMA).  Marland Kitchen losartan (COZAAR) 50 MG tablet Take 1 tablet (50 mg total) by mouth daily. Please specify directions, refills and quantity  . MAGNESIUM PO Take 259 mg by mouth at bedtime.  . montelukast (SINGULAIR) 10 MG tablet Take 1 tablet (10 mg total) by mouth daily.  . Multiple Vitamin (MULTIVITAMIN WITH MINERALS) TABS tablet Take 1 tablet by mouth daily.  . NP THYROID 60 MG tablet Take 1 tablet (60 mg total) by mouth daily.  . Omega-3 Fatty Acids (FISH OIL PO) Take by mouth daily.  Marland Kitchen omeprazole (PRILOSEC) 40 MG capsule TAKE 1 CAPSULE BY MOUTH EVERY DAY  . TURMERIC PO Take 1,000 mg by  mouth daily.  . metoprolol tartrate (LOPRESSOR) 50 MG tablet Take 1 tablet (50 mg total) by mouth 2 (two) times daily.   No facility-administered encounter medications on file as of 08/21/2019.    Activities of Daily Living In your present state of health, do you have any difficulty performing the following activities: 08/21/2019  Hearing? N  Vision? N  Difficulty concentrating or making decisions? N  Walking or climbing stairs? N  Dressing or bathing? N  Doing errands, shopping? N  Preparing Food and eating ? N  Using the Toilet? N  In the past six months, have you accidently leaked urine? N  Do you have problems with loss of bowel control? N  Managing your Medications? N  Managing your Finances? N  Housekeeping or managing your Housekeeping? N  Some recent data might be hidden   Patient Care Team: Nelwyn SalisburyFry, Stephen A, MD as PCP - General (Family Medicine) Regan Lemmingamnitz, Will Martin, MD as PCP - Electrophysiology (Cardiology)    Assessment:   This is a routine wellness examination for Whitney NixonJanice.  Exercise Activities and Dietary recommendations Current Exercise Habits: Home exercise routine, Type of exercise: walking;calisthenics, Time (Minutes): > 60, Frequency (Times/Week): 6, Weekly Exercise (Minutes/Week): 0, Intensity: Moderate  Goals    . Get off of some of my medications    .  Weight (lb) < 140 lb (63.5 kg)       Fall Risk Fall Risk  08/21/2019 03/25/2017 09/23/2015 07/03/2014  Falls in the past year? 0 No No No   Is the patient's home free of loose throw rugs in walkways, pet beds, electrical cords, etc?   yes      Grab bars in the bathroom? yes      Handrails on the stairs?   yes      Adequate lighting?   yes  Timed Get Up and Go performed: N/A due to telephone visit  Depression Screen PHQ 2/9 Scores 08/21/2019 03/25/2017 09/23/2015 07/03/2014  PHQ - 2 Score 2 1 0 0  PHQ- 9 Score 3 - - -     Cognitive Function     6CIT Screen 08/21/2019  What Year? 0 points  What month? 0 points  Count back from 20 0 points  Months in reverse 0 points  Repeat phrase 0 points    Immunization History  Administered Date(s) Administered  . Influenza, High Dose Seasonal PF 07/24/2016, 04/14/2017, 05/04/2019  . Influenza,inj,Quad PF,6+ Mos 07/03/2014  . Influenza-Unspecified 06/11/2015, 04/14/2017, 04/20/2018, 05/04/2019  . Tdap 09/23/2015  . Zoster Recombinat (Shingrix) 05/04/2019    Qualifies for Shingles Vaccine?Yes, she has received #1 of 2 Shingrix thus far.   Screening Tests Health Maintenance  Topic Date Due  . COLONOSCOPY  05/23/2017  . MAMMOGRAM  12/25/2018  . TETANUS/TDAP  09/22/2025  . INFLUENZA VACCINE  Completed  . DEXA SCAN  Completed  . Hepatitis C Screening  Completed    Cancer Screenings: Lung: Low Dose CT Chest recommended if Age 14-80 years, 30 pack-year currently smoking OR have quit w/in 15years. Patient does not qualify. Breast:  Up to date on Mammogram? No   Up to date of Bone Density/Dexa? No Colorectal: no. Patient declines another colonoscopy. Orders placed for cologuard, DEXA, and Mammogram today.    Additional Screenings: : Hepatitis C Screening: performed 10/22/2017 up to date     Plan:     Whitney Evans is agreeable to completing a cologuard, mammogram, and DEXA. Orders placed today. She also will obtain Shingrix #2  from her local pharmacy. She will schedule a routine follow up with PCP Dr. Sarajane Jews as she needs medication refills and routine labs.  I have personally reviewed and noted the following in the patient's chart:   . Medical and social history . Use of alcohol, tobacco or illicit drugs  . Current medications and supplements . Functional ability and status . Nutritional status . Physical activity . Advanced directives . List of other physicians . Hospitalizations, surgeries, and ER visits in previous 12 months . Vitals . Screenings to include cognitive, depression, and falls . Referrals and appointments  In addition, I have reviewed and discussed with patient certain preventive protocols, quality metrics, and best practice recommendations. A written personalized care plan for preventive services as well as general preventive health recommendations were provided to patient.     Franne Forts, LPN  [id number]

## 2019-08-21 NOTE — Patient Instructions (Addendum)
Whitney Evans , Thank you for taking time to participate in your Medicare Wellness Visit. I appreciate your ongoing commitment to your health goals. Please review the following plan we discussed and let me know if I can assist you in the future.   Screening recommendations/referrals: Colorectal Screening: performed 05/24/2007; declines colonoscopy but agrees to cologuard. Order placed and will be delivered to your home. Mammogram: performed 12/24/2016; over due; order sent to [facility] [address]. [place] [address] Bone Density: performed 12/24/2016; over due; order sent to [facility]. [address], [address]. [place] [address]  Vision and Dental Exams: Recommended annual ophthalmology exams for early detection of glaucoma and other disorders of the eye Recommended annual dental exams for proper oral hygiene  Diabetic Exams: Diabetic Eye Exam: N/A Diabetic Foot Exam: N/A  Vaccinations: Influenza vaccine: performed 05/04/2019; up to date; due again Fall 2021 Pneumococcal vaccine: patient allergy Tdap vaccine: performed 09/23/2015; up to date; due again 09/22/2025 Shingles vaccine: #1 performed 05/04/2019. Please have #2 at your local pharmacy. Advanced directives: Advance directives discussed with you today. I have provided a copy for you to complete at home and have notarized. Once this is complete please bring a copy in to our office so we can scan it into your chart.  Goals: Recommend to drink at least 6-8 8oz glasses of water per day. Recommend to decrease portion sizes by eating 3 small healthy meals and at least 2 healthy snacks per day. Continue [employer] program! Doristine Devoid job!  Next appointment: see attached Preventive Care 71 Years and Older, Female Preventive care refers to lifestyle choices and visits with your health care provider that can promote health and wellness. What does preventive care include?  A yearly physical exam. This is also  called an annual well check.  Dental exams once or twice a year.  Routine eye exams. Ask your health care provider how often you should have your eyes checked.  Personal lifestyle choices, including:  Daily care of your teeth and gums.  Regular physical activity.  Eating a healthy diet.  Avoiding tobacco and drug use.  Limiting alcohol use.  Practicing safe sex.  Taking low-dose aspirin every day if recommended by your health care provider.  Taking vitamin and mineral supplements as recommended by your health care provider. What happens during an annual well check? The services and screenings done by your health care provider during your annual well check will depend on your age, overall health, lifestyle risk factors, and family history of disease. Counseling  Your health care provider may ask you questions about your:  Alcohol use.  Tobacco use.  Drug use.  Emotional well-being.  Home and relationship well-being.  Sexual activity.  Eating habits.  History of falls.  Memory and ability to understand (cognition).  Work and work Statistician.  Reproductive health. Screening  You may have the following tests or measurements:  Height, weight, and BMI.  Blood pressure.  Lipid and cholesterol levels. These may be checked every 5 years, or more frequently if you are over 71 years old.  Skin check.  Lung cancer screening. You may have this screening every year starting at age 71 if you have a 30-pack-year history of smoking and currently smoke or have quit within the past 15 years.  Fecal occult blood test (FOBT) of the stool. You may have this test every year starting at age 71.  Flexible sigmoidoscopy or colonoscopy. You may have a  sigmoidoscopy every 5 years or a colonoscopy every 10 years starting at age 71.  Hepatitis C blood test.  Hepatitis B blood test.  Sexually transmitted disease (STD) testing.  Diabetes screening. This is done by checking  your blood sugar (glucose) after you have not eaten for a while (fasting). You may have this done every 1-3 years.  Bone density scan. This is done to screen for osteoporosis. You may have this done starting at age 71.  Mammogram. This may be done every 1-2 years. Talk to your health care provider about how often you should have regular mammograms. Talk with your health care provider about your test results, treatment options, and if necessary, the need for more tests. Vaccines  Your health care provider may recommend certain vaccines, such as:  Influenza vaccine. This is recommended every year.  Tetanus, diphtheria, and acellular pertussis (Tdap, Td) vaccine. You may need a Td booster every 10 years.  Zoster vaccine. You may need this after age 71.  Pneumococcal 13-valent conjugate (PCV13) vaccine. One dose is recommended after age 71.  Pneumococcal polysaccharide (PPSV23) vaccine. One dose is recommended after age 80. Talk to your health care provider about which screenings and vaccines you need and how often you need them. This information is not intended to replace advice given to you by your health care provider. Make sure you discuss any questions you have with your health care provider. Document Released: 09/20/2015 Document Revised: 05/13/2016 Document Reviewed: 06/25/2015 Elsevier Interactive Patient Education  2017 [employer].  Fall Prevention in the Home Falls can cause injuries. They can happen to people of all ages. There are many things you can do to make your home safe and to help prevent falls. What can I do on the outside of my home?  Regularly fix the edges of walkways and driveways and fix any cracks.  Remove anything that might make you trip as you walk through a door, such as a raised step or threshold.  Trim any bushes or trees on the path to your home.  Use bright outdoor lighting.  Clear any walking paths of anything that might make someone trip, such as  rocks or tools.  Regularly check to see if handrails are loose or broken. Make sure that both sides of any steps have handrails.  Any raised decks and porches should have guardrails on the edges.  Have any leaves, snow, or ice cleared regularly.  Use sand or salt on walking paths during winter.  Clean up any spills in your garage right away. This includes oil or grease spills. What can I do in the bathroom?  Use night lights.  Install grab bars by the toilet and in the tub and shower. Do not use towel bars as grab bars.  Use non-skid mats or decals in the tub or shower.  If you need to sit down in the shower, use a plastic, non-slip stool.  Keep the floor dry. Clean up any water that spills on the floor as soon as it happens.  Remove soap buildup in the tub or shower regularly.  Attach bath mats securely with double-sided non-slip rug tape.  Do not have throw rugs and other things on the floor that can make you trip. What can I do in the bedroom?  Use night lights.  Make sure that you have a light by your bed that is easy to reach.  Do not use any sheets or blankets that are too big for your bed. They  should not hang down onto the floor.  Have a firm chair that has side arms. You can use this for support while you get dressed.  Do not have throw rugs and other things on the floor that can make you trip. What can I do in the kitchen?  Clean up any spills right away.  Avoid walking on wet floors.  Keep items that you use a lot in easy-to-reach places.  If you need to reach something above you, use a strong step stool that has a grab bar.  Keep electrical cords out of the way.  Do not use floor polish or wax that makes floors slippery. If you must use wax, use non-skid floor wax.  Do not have throw rugs and other things on the floor that can make you trip. What can I do with my stairs?  Do not leave any items on the stairs.  Make sure that there are handrails on  both sides of the stairs and use them. Fix handrails that are broken or loose. Make sure that handrails are as long as the stairways.  Check any carpeting to make sure that it is firmly attached to the stairs. Fix any carpet that is loose or worn.  Avoid having throw rugs at the top or bottom of the stairs. If you do have throw rugs, attach them to the floor with carpet tape.  Make sure that you have a light switch at the top of the stairs and the bottom of the stairs. If you do not have them, ask someone to add them for you. What else can I do to help prevent falls?  Wear shoes that:  Do not have high heels.  Have rubber bottoms.  Are comfortable and fit you well.  Are closed at the toe. Do not wear sandals.  If you use a stepladder:  Make sure that it is fully opened. Do not climb a closed stepladder.  Make sure that both sides of the stepladder are locked into place.  Ask someone to hold it for you, if possible.  Clearly mark and make sure that you can see:  Any grab bars or handrails.  First and last steps.  Where the edge of each step is.  Use tools that help you move around (mobility aids) if they are needed. These include:  Canes.  Walkers.  Scooters.  Crutches.  Turn on the lights when you go into a dark area. Replace any light bulbs as soon as they burn out.  Set up your furniture so you have a clear path. Avoid moving your furniture around.  If any of your floors are uneven, fix them.  If there are any pets around you, be aware of where they are.  Review your medicines with your doctor. Some medicines can make you feel dizzy. This can increase your chance of falling. Ask your doctor what other things that you can do to help prevent falls. This information is not intended to replace advice given to you by your health care provider. Make sure you discuss any questions you have with your health care provider. Document Released: 06/20/2009 Document  Revised: 01/30/2016 Document Reviewed: 09/28/2014 Elsevier Interactive Patient Education  2017 [employer].

## 2019-08-26 ENCOUNTER — Other Ambulatory Visit: Payer: Self-pay | Admitting: Family Medicine

## 2019-09-04 ENCOUNTER — Encounter: Payer: Self-pay | Admitting: Family Medicine

## 2019-09-04 ENCOUNTER — Other Ambulatory Visit: Payer: Self-pay

## 2019-09-04 ENCOUNTER — Telehealth (INDEPENDENT_AMBULATORY_CARE_PROVIDER_SITE_OTHER): Payer: PPO | Admitting: Family Medicine

## 2019-09-04 DIAGNOSIS — I10 Essential (primary) hypertension: Secondary | ICD-10-CM

## 2019-09-04 DIAGNOSIS — I442 Atrioventricular block, complete: Secondary | ICD-10-CM | POA: Diagnosis not present

## 2019-09-04 NOTE — Progress Notes (Signed)
Virtual Visit via Video Note  I connected with the patient on 09/04/19 at  8:45 AM EST by a video enabled telemedicine application and verified that I am speaking with the correct person using two identifiers.  Location patient: home Location provider:work or home office Persons participating in the virtual visit: patient, provider  I discussed the limitations of evaluation and management by telemedicine and the availability of in person appointments. The patient expressed understanding and agreed to proceed.   HPI: Here to ask about medication refills and about exercise. She feels well and her pacemaker is checked every 90 days by Cardiology. This morning her BP is 112/67, pulse is 68, and weight is 197. She has been trying to exercise around the house but finds it difficult. It is too cold to go outside. She asks about the safety of going to a gym to work out.    ROS: See pertinent positives and negatives per HPI.  Past Medical History:  Diagnosis Date  . Allergic rhinitis   . Asthma   . Chronic hoarseness    sees Dr. [name] (ENT) [place]   . Essential hypertension   . GERD (gastroesophageal reflux disease)   . Hypothyroidism   . Insomnia   . Neck pain, chronic     Past Surgical History:  Procedure Laterality Date  . CESAREAN SECTION     x 2  . COLONOSCOPY  05/24/07   per Dr. Russella Dar, benign polpys, and diverticulosis, repeat 5 years   . EP IMPLANTABLE DEVICE N/A 06/18/2016   Procedure: Pacemaker Implant;  Surgeon: Will [name], MD;  Location: [facility];  Service: Cardiovascular;  Laterality: N/A;  . EYE SURGERY    . left foot surgery    . VESICOVAGINAL FISTULA CLOSURE W/ TAH    . WISDOM TOOTH EXTRACTION      Family History  Problem Relation Age of Onset  . Coronary artery disease Other   . Diabetes Other   . Hyperlipidemia Other   . Hypertension Other   . Stroke Other   . Stroke Mother   . Heart attack Father 71     Current Outpatient  Medications:  .  amLODipine (NORVASC) 5 MG tablet, Take 1 tablet (5 mg total) by mouth daily., Disp: 90 tablet, Rfl: 3 .  budesonide-formoterol (SYMBICORT) 80-4.5 MCG/ACT inhaler, Inhale 2 puffs into the lungs 2 (two) times daily., Disp: 3 Inhaler, Rfl: 3 .  Calcium Carbonate-Vit D-Min (CALCIUM 1200 PO), Take 1,200 mg by mouth daily., Disp: , Rfl:  .  Cholecalciferol (VITAMIN D3) 10000 units capsule, Take 10,000 Units by mouth daily., Disp: , Rfl:  .  Coenzyme Q10 (CO Q 10 PO), Take 400 mg by mouth daily., Disp: , Rfl:  .  ipratropium-albuterol (DUONEB) 0.5-2.5 (3) MG/3ML SOLN, TAKE 3 MLS BY NEBULIZATION EVERY 4 (FOUR) HOURS AS NEEDED (ASTHMA)., Disp: 360 mL, Rfl: 3 .  losartan (COZAAR) 50 MG tablet, Take 1 tablet (50 mg total) by mouth daily. Please specify directions, refills and quantity, Disp: 90 tablet, Rfl: 3 .  MAGNESIUM PO, Take 259 mg by mouth at bedtime., Disp: , Rfl:  .  metoprolol tartrate (LOPRESSOR) 50 MG tablet, Take 1 tablet (50 mg total) by mouth 2 (two) times daily., Disp: 180 tablet, Rfl: 3 .  montelukast (SINGULAIR) 10 MG tablet, Take 1 tablet (10 mg total) by mouth daily., Disp: 90 tablet, Rfl: 3 .  Multiple Vitamin (MULTIVITAMIN WITH MINERALS) TABS tablet, Take 1 tablet by mouth daily., Disp: ,  Rfl:  .  NP THYROID 60 MG tablet, Take 1 tablet (60 mg total) by mouth daily., Disp: 90 tablet, Rfl: 3 .  Omega-3 Fatty Acids (FISH OIL PO), Take by mouth daily., Disp: , Rfl:  .  omeprazole (PRILOSEC) 40 MG capsule, TAKE 1 CAPSULE BY MOUTH EVERY DAY, Disp: 90 capsule, Rfl: 3 .  PROAIR HFA 108 (90 Base) MCG/ACT inhaler, TAKE 2 PUFFS BY MOUTH EVERY 4 HOURS AS NEEDED, Disp: 25.5 g, Rfl: 3 .  TURMERIC PO, Take 1,000 mg by mouth daily., Disp: , Rfl:   EXAM:  VITALS per patient if applicable:  GENERAL: alert, oriented, appears well and in no acute distress  HEENT: atraumatic, conjunttiva clear, no obvious abnormalities on inspection of external nose and ears  NECK: normal movements  of the head and neck  LUNGS: on inspection no signs of respiratory distress, breathing rate appears normal, no obvious gross SOB, gasping or wheezing  CV: no obvious cyanosis  MS: moves all visible extremities without noticeable abnormality  PSYCH/NEURO: pleasant and cooperative, no obvious depression or anxiety, speech and thought processing grossly intact  ASSESSMENT AND PLAN: Her heart block is well compensated for and her HTN is stable. She actually will not need refills until April. For exercise, I advised her to not go to her gym yet because I feel this would put her at rick of exposure to the Covid virus. For at home exercise, I suggested she find an elliptical machine to use, and she liked that idea.  Alysia Penna, MD  Discussed the following assessment and plan:  No diagnosis found.     I discussed the assessment and treatment plan with the patient. The patient was provided an opportunity to ask questions and all were answered. The patient agreed with the plan and demonstrated an understanding of the instructions.   The patient was advised to call back or seek an in-person evaluation if the symptoms worsen or if the condition fails to improve as anticipated.

## 2019-09-13 ENCOUNTER — Ambulatory Visit (INDEPENDENT_AMBULATORY_CARE_PROVIDER_SITE_OTHER): Payer: PPO | Admitting: *Deleted

## 2019-09-13 DIAGNOSIS — I442 Atrioventricular block, complete: Secondary | ICD-10-CM | POA: Diagnosis not present

## 2019-09-13 LAB — CUP PACEART REMOTE DEVICE CHECK
Battery Remaining Longevity: 119 mo
Battery Remaining Percentage: 95.5 %
Battery Voltage: 3.01 V
Brady Statistic AP VP Percent: 3 %
Brady Statistic AP VS Percent: 41 %
Brady Statistic AS VP Percent: 9.3 %
Brady Statistic AS VS Percent: 46 %
Brady Statistic RA Percent Paced: 42 %
Brady Statistic RV Percent Paced: 12 %
Date Time Interrogation Session: 20210106020029
Implantable Lead Implant Date: 20171012
Implantable Lead Implant Date: 20171012
Implantable Lead Location: 753859
Implantable Lead Location: 753860
Implantable Pulse Generator Implant Date: 20171012
Lead Channel Impedance Value: 430 Ohm
Lead Channel Impedance Value: 580 Ohm
Lead Channel Pacing Threshold Amplitude: 0.5 V
Lead Channel Pacing Threshold Amplitude: 0.75 V
Lead Channel Pacing Threshold Pulse Width: 0.5 ms
Lead Channel Pacing Threshold Pulse Width: 0.5 ms
Lead Channel Sensing Intrinsic Amplitude: 1.9 mV
Lead Channel Sensing Intrinsic Amplitude: 6.5 mV
Lead Channel Setting Pacing Amplitude: 2 V
Lead Channel Setting Pacing Amplitude: 2.5 V
Lead Channel Setting Pacing Pulse Width: 0.5 ms
Lead Channel Setting Sensing Sensitivity: 2 mV
Pulse Gen Model: 2272
Pulse Gen Serial Number: 7955201

## 2019-11-08 ENCOUNTER — Telehealth: Payer: Self-pay | Admitting: Family Medicine

## 2019-11-08 NOTE — Chronic Care Management (AMB) (Signed)
  Chronic Care Management   Note  11/08/2019 Name: Whitney Evans MRN: [id number] DOB: 08/03/1946  Whitney Evans is a 74 y.o. year old female who is a primary care patient of Laurey Morale, MD. I reached out to [name] by phone today in response to a referral sent by [name]'s PCP, Laurey Morale, MD.   [name] was given information about Chronic Care Management services today including:  1. CCM service includes personalized support from designated clinical staff supervised by her physician, including individualized plan of care and coordination with other care providers 2. 24/7 contact phone numbers for assistance for urgent and routine care needs. 3. Service will only be billed when office clinical staff spend 20 minutes or more in a month to coordinate care. 4. Only one practitioner may furnish and bill the service in a calendar month. 5. The patient may stop CCM services at any time (effective at the end of the month) by phone call to the office staff. 6. The patient will be responsible for cost sharing (co-pay) of up to 20% of the service fee (after annual deductible is met).  Patient agreed to services and verbal consent obtained.   Follow up plan:   [name] [facility] Sched[facility]

## 2019-11-21 ENCOUNTER — Telehealth: Payer: Self-pay

## 2019-11-21 DIAGNOSIS — E039 Hypothyroidism, unspecified: Secondary | ICD-10-CM

## 2019-11-21 DIAGNOSIS — I10 Essential (primary) hypertension: Secondary | ICD-10-CM

## 2019-11-21 NOTE — Telephone Encounter (Signed)
I am requesting an ambulatory referral to CCM be placed for this patient. Referral will need to have 2 current diagnosis attached to it. Thank you!  

## 2019-11-21 NOTE — Telephone Encounter (Signed)
Done

## 2019-11-21 NOTE — Chronic Care Management (AMB) (Signed)
Chronic Care Management Pharmacy  Name: Whitney Evans  MRN: [id number]  DOB: 03-08-1946  Initial Questions: 1. Have you seen any other providers since your last visit? NA 2. Any changes in your medicines or health? No   Chief Complaint/ HPI  Whitney Evans,  74 y.o. , female presents for their Initial CCM visit with the clinical pharmacist via telephone due to COVID-19 Pandemic.  PCP : Whitney Salisbury, MD  Their chronic conditions include: HTN, asthma, GERD, hypothyroidism, osteopenia, HLD  Office Visits: 09/04/2019- Patient presented for virtual visit with Dr. Gershon Crane, MD for HTN. Patient's HTN has been stable. BP: 112/67. Pulse: 68. Patient advised not to go to gym to reduce exposure to COVID. Suggested patient to use elliptical machine for home use.   08/21/2019- Patient presented for virtual visit with Whitney Dodge, LPN for annual wellness visit. Patient uses [facility] for lifestyle modifications. Patient agreed to complete cologuard, mammogram, and DEXA scan.   Consult Visit: 09/13/2019- Cardiologist- Patient presented to Dr. Loman Brooklyn, MD for AV block.   Medications: Outpatient Encounter Medications as of 11/22/2019  Medication Sig  . BLACK PEPPER-TURMERIC PO Take by mouth.  . Calcium Carbonate-Vit D-Min (CALCIUM 1200 PO) Take 1,200 mg by mouth daily.  . Cholecalciferol (VITAMIN D3) 10000 units capsule Take 10,000 Units by mouth daily.  . Cholecalciferol 25 MCG (1000 UT) tablet Take 1,000 Units by mouth daily.  . Coenzyme Q10 (CO Q 10 PO) Take 100 mg by mouth daily.   Marland Kitchen MAGNESIUM PO Take 450 mg by mouth at bedtime.   . Multiple Vitamin (MULTIVITAMIN WITH MINERALS) TABS tablet Take 1 tablet by mouth daily.  . Omega-3 Fatty Acids (FISH OIL PO) Take 1,300 mg by mouth daily.   . [DISCONTINUED] amLODipine (NORVASC) 5 MG tablet Take 1 tablet (5 mg total) by mouth daily.  . [DISCONTINUED] budesonide-formoterol (SYMBICORT) 80-4.5 MCG/ACT inhaler Inhale 2 puffs into the  lungs 2 (two) times daily.  . [DISCONTINUED] ipratropium-albuterol (DUONEB) 0.5-2.5 (3) MG/3ML SOLN TAKE 3 MLS BY NEBULIZATION EVERY 4 (FOUR) HOURS AS NEEDED (ASTHMA).  . [DISCONTINUED] montelukast (SINGULAIR) 10 MG tablet Take 1 tablet (10 mg total) by mouth daily.  . [DISCONTINUED] NP THYROID 60 MG tablet Take 1 tablet (60 mg total) by mouth daily.  . [DISCONTINUED] omeprazole (PRILOSEC) 40 MG capsule TAKE 1 CAPSULE BY MOUTH EVERY DAY  . [DISCONTINUED] PROAIR HFA 108 (90 Base) MCG/ACT inhaler TAKE 2 PUFFS BY MOUTH EVERY 4 HOURS AS NEEDED  . TURMERIC PO Take 1,000 mg by mouth daily.  . [DISCONTINUED] losartan (COZAAR) 50 MG tablet Take 1 tablet (50 mg total) by mouth daily. Please specify directions, refills and quantity (Patient not taking: Reported on 11/22/2019)  . [DISCONTINUED] metoprolol tartrate (LOPRESSOR) 50 MG tablet Take 1 tablet (50 mg total) by mouth 2 (two) times daily.   No facility-administered encounter medications on file as of 11/22/2019.     Current Diagnosis/Assessment:  Goals Addressed            This Visit's Progress   . Pharmacy Care Plan       CARE PLAN ENTRY  Current Barriers:  . Chronic Disease Management support, education, and care coordination needs related to HTN, HLD, and Asthma, GERD, Hypothyroidism, Osteopenia  Pharmacist Clinical Goal(s):  Marland Kitchen Asthma . Prevent worsening of shortness of breath and hospitalizations. . High blood pressure:  Marland Kitchen Maintain Blood pressure <130/80 mmHg. Marland Kitchen Recent reported home blood pressure: 101-117/ 60-68 mmHg. . High cholesterol:  . Cholesterol goals:  Total Cholesterol goal under 200, Triglycerides goal under 150, HDL goal above 40 (men) or above 50 (women), LDL goal under 100.  . Current cholesterol levels (10/21/2017): - Total cholesterol: 267 - Triglycerides: 168 - HDL: 62 - LDL: 171 . Hypothyroidism:  . Maintain TSH between 0.45 to 4.5uIU/ml. . Current TSH: 2.71 (10/21/2017) . Heart burn: Minimize reflux symptoms.    Interventions: . Comprehensive medication review performed. . Collaboration with provider re: medication management . Blood pressure:  . Discussed need to continue checking blood pressure at home.  . Discussed diet modifications. DASH diet:  following a diet emphasizing fruits and vegetables and low-fat dairy products along with whole grains, fish, poultry, and nuts. Reducing red meats and sugars.  . Exercising- see exercising section below  . Reducing the amount of salt intake to 1500mg /per day.  . Recommend using a salt substitute to replace your salt if you need flavor.    . Weight reduction- We discussed losing 5-10% of body weight . Managed by Dr. Curt Evans.  . Continue: amlodipine 5mg ,1 tablet daily and metoprolol tartrate 50mg , 1 tablet twice daily.  . High cholesterol . How to reduce cholesterol through diet/weight management and physical activity.    . We discussed how a diet high in plant sterols (fruits/vegetables/nuts/whole grains/legumes) may reduce your cholesterol.  Encouraged increasing fiber to a daily intake of 10-25g/day  . Continue: diet modification.  . Recommend annual cholesterol blood work.  . Asthma . Continue:  - Ipratropium/ albuterol 0.5/2.5mg / 80ml, 1 vial via nebulizer every four hours as needed - montelukast (Singulair) 10mg , 1 tablet daily - budesonide/ formoterol (Symbicort 80-4.21mcg/ act inhaler), inhaler 2 puffs twice daily - albuterol (Proair HFA), 2 puffs every four hours as needed. . Discussed rinsing mouth after each use of budesonide/formoterol (Symbicort)  . Hypothyroidism . Recommend annual thyroid blood work.  . Discussed taking thyroid medication consistent in regards to food and other medications.  . Continue:NP thyroid 60mg , 1 tablet daily  . GERD: . Discussed non-pharmacological interventions for acid reflux. Take measures to prevent acid reflux, such as avoiding spicy foods, avoiding caffeine, avoid laying down a few hours after eating,  and raising the head of the bed. . Continue: omeprazole 40mg , 1 capsule daily and sodium bicarbonate, 1 tablet daily.  . Bone health . Continue:  calcium carbonate/ vitamin D, 1200mg , 1 tablet daily and cholecalciferol 1,000 units daily  . Recommend obtaining vitamin level blood check.  Patient Self Care Activities:  . Self administers medications as prescribed and Calls provider office for new concerns or questions . Continue current medications as directed by providers.  . Continue following up with your primary care provider and specialists. . Continue at home blood pressure readings.  . Continue working on health habits (diet/ exercise).  Initial goal documentation        Asthma   Eosinophil count:   Lab Results  Component Value Date/Time   EOSPCT 2.7 10/21/2017 09:07 AM  %                               Eos (Absolute):  Lab Results  Component Value Date/Time   EOSABS 0.2 10/21/2017 09:07 AM   Tobacco Status:  Social History   Tobacco Use  Smoking Status Former Smoker  Smokeless Tobacco Never Used   Patient has failed these meds in past: Qvar Patient is currently controlled on the following medications:  -  Ipratropium/ albuterol 0.5/2.5mg / 69ml, 1  vial via nebulizer every four hours as needed - montelukast 10mg , 1 tablet daily  - budesonide/ formoterol (Symbicort 80-4.57mcg/ act inhaler), inhaler 2 puffs twice daily - albuterol (Proair HFA), 2 puffs every four hours as needed   Using maintenance inhaler regularly? Yes Frequency of rescue inhaler use:  1-2x per week  We discussed:  proper inhaler technique  Plan Continue current medications.   Hypertension   BP today is: <120/80  Office blood pressures are  BP Readings from Last 3 Encounters:  08/21/19 106/67  06/21/18 124/70  10/21/17 108/62   Patient has failed these meds in the past: lisinopril, losartan  Patient checks BP at home daily.   Patient home BP readings are ranging: 101-117/ 60-68 mmHg    Patient is controlled on:  - amlodipine 5mg ,1 tablet daily - metoprolol tartrate 50mg , 1 tablet twice daily   We discussed diet and exercise extensively . Discussed diet modifications. DASH diet:  following a diet emphasizing fruits and vegetables and low-fat dairy products along with whole grains, fish, poultry, and nuts. Reducing red meats and sugars.  . Exercising . Reducing the amount of salt intake to 1500mg /per day.  . Recommend using a salt substitute to replace your salt if you need flavor.     . Weight reduction- We discussed losing 5-10% of body weight.   Plan Managed by Dr. 10/23/17.  Continue current medications.    Hyperlipidemia   Lipid Panel     Component Value Date/Time   CHOL 267 (H) 10/21/2017 0907   TRIG 168.0 (H) 10/21/2017 0907   HDL 62.20 10/21/2017 0907   CHOLHDL 4 10/21/2017 0907   VLDL 33.6 10/21/2017 0907   LDLCALC 171 (H) 10/21/2017 0907   LDLDIRECT 173.6 12/03/2011 0948    ASCVD: 10.3%   Patient has failed these meds in past: atorvastatin  Patient is currently uncontrolled on the following medications:  - omega-3 fatty acids 1 capsule daily   We discussed:  diet and exercise extensively . How to reduce cholesterol through diet/weight management and physical activity.    . We discussed how a diet high in plant sterols (fruits/vegetables/nuts/whole grains/legumes) may reduce your cholesterol.  Encouraged increasing fiber to a daily intake of 10-25g/day.  - has been focusing on losing weight. Patient recently bought an elliptical ( a week ago) and aims to use everyday.   Plan Patient overdue for lipid panel.  Continue control with diet and exercise.   Hypothyroidism   TSH  Date Value Ref Range Status  10/21/2017 2.71 0.35 - 4.50 uIU/mL Final    Patient has failed these meds in past: none  Patient is currently controlled on the following medications:  - NP thyroid 60mg , 1 tablet daily (first thing with rest of pills).   We discussed:   keeping medication administration consistent everyday (in regards to meals and taking with other medications).   Plan Patient due for TSH.  Continue current medications  GERD  Patient has failed these meds in past: Nexium, Tums  Patient is currently controlled on the following medications:  - omeprazole 40mg , 1 capsule daily  - sodium bicarbonate, 1 tablet daily (with morning coffee)   We discussed:  non-pharmacological interventions for acid reflux. Take measures to prevent acid reflux, such as avoiding spicy foods, avoiding caffeine, avoid laying down a few hours after eating, and raising the head of the bed - counseled use of other antacids for breatk through acid reflux.   Plan Patient states controlled with current regimen.  Continue  current medications  Osteopenia   Patient has failed these meds in past: none  Patient is currently controlled on the following medications:  - calcium carbonate/ vitamin D, 1200mg , 1 tablet daily - cholecalciferol 1,000 units daily   Plan Recommend vitamin D level.  Continue current medications.    Medication Management  Patient organizes medications: uses pill box Barriers: denies issues obtaining medications (cost).  Adherence: no gaps in fill history (per medication dispense report)   Follow up  Follow up visit with PharmD in 6 months.  Will conduct general telephone calls for periodic check-ins before next visit.   Patient due for annual labwork (including lipid panel and TSH) and physical with Dr. . Will send message to coordinate.   Updated patient immunization profile. Patient obtained both Covid vaccines at the [facility] with the health department. ([employer]: 1st dose 2/22 and 2nd dose 3/15).   Verbal consent obtained for [facility] enhanced pharmacy services (medication synchronization, adherence packaging, delivery coordination). A medication sync plan was created to allow patient to get all medications delivered once  every 30 to 90 days per patient preference. Patient understands they have freedom to choose pharmacy and clinical pharmacist will coordinate care between all prescribers and [facility].  Contacted Dr. 4/15 office and requested refill for metoprolol sent to [facility]. Will send separate message requesting remaining chronic medications prescribed by Dr. Elberta Fortis.    Clent Ridges, PharmD Clinical Pharmacist [facility] [place] [phone]

## 2019-11-22 ENCOUNTER — Other Ambulatory Visit: Payer: Self-pay

## 2019-11-22 ENCOUNTER — Telehealth: Payer: Self-pay

## 2019-11-22 ENCOUNTER — Telehealth: Payer: Self-pay | Admitting: Cardiology

## 2019-11-22 ENCOUNTER — Ambulatory Visit: Payer: PPO

## 2019-11-22 DIAGNOSIS — Z780 Asymptomatic menopausal state: Secondary | ICD-10-CM

## 2019-11-22 DIAGNOSIS — I10 Essential (primary) hypertension: Secondary | ICD-10-CM

## 2019-11-22 DIAGNOSIS — J4520 Mild intermittent asthma, uncomplicated: Secondary | ICD-10-CM

## 2019-11-22 DIAGNOSIS — E039 Hypothyroidism, unspecified: Secondary | ICD-10-CM

## 2019-11-22 DIAGNOSIS — E785 Hyperlipidemia, unspecified: Secondary | ICD-10-CM

## 2019-11-22 MED ORDER — ALBUTEROL SULFATE HFA 108 (90 BASE) MCG/ACT IN AERS: INHALATION_SPRAY | RESPIRATORY_TRACT | 3 refills | Status: DC

## 2019-11-22 MED ORDER — OMEPRAZOLE 40 MG PO CPDR: DELAYED_RELEASE_CAPSULE | ORAL | 3 refills | Status: DC

## 2019-11-22 MED ORDER — AMLODIPINE BESYLATE 5 MG PO TABS: 5.0000 mg | ORAL_TABLET | Freq: Every day | ORAL | 3 refills | Status: DC

## 2019-11-22 MED ORDER — METOPROLOL TARTRATE 50 MG PO TABS: 50.0000 mg | ORAL_TABLET | Freq: Two times a day (BID) | ORAL | 0 refills | Status: DC

## 2019-11-22 MED ORDER — NP THYROID 60 MG PO TABS: 60.0000 mg | ORAL_TABLET | Freq: Every day | ORAL | 3 refills | Status: DC

## 2019-11-22 MED ORDER — MONTELUKAST SODIUM 10 MG PO TABS: 10.0000 mg | ORAL_TABLET | Freq: Every day | ORAL | 3 refills | Status: DC

## 2019-11-22 MED ORDER — IPRATROPIUM-ALBUTEROL 0.5-2.5 (3) MG/3ML IN SOLN: 3.0000 mL | RESPIRATORY_TRACT | 3 refills | Status: DC | PRN

## 2019-11-22 MED ORDER — BUDESONIDE-FORMOTEROL FUMARATE 80-4.5 MCG/ACT IN AERO: 2.0000 | INHALATION_SPRAY | Freq: Two times a day (BID) | RESPIRATORY_TRACT | 3 refills | Status: DC

## 2019-11-22 MED ORDER — METOPROLOL TARTRATE 50 MG PO TABS: 50.0000 mg | ORAL_TABLET | Freq: Two times a day (BID) | ORAL | 3 refills | Status: DC

## 2019-11-22 NOTE — Telephone Encounter (Signed)
New message   Message received from pharmacist at [facility] [place]. She said that pt will be changing pharmacies and needs a new prescription sent in.    *STAT* If patient is at the pharmacy, call can be transferred to refill team.   1. Which medications need to be refilled? (please list name of each medication and dose if known) metoprolol 50 mg  2. Which pharmacy/location (including street and city if local pharmacy) is medication to be sent to? [facility] [phone]  3. Do they need a 30 day or 90 day supply? 30

## 2019-11-22 NOTE — Telephone Encounter (Signed)
Pt's medication was sent to pt's pharmacy as requested. Confirmation received.  °

## 2019-11-22 NOTE — Patient Instructions (Addendum)
Visit Information  Goals Addressed            This Visit's Progress   . Pharmacy Care Plan       CARE PLAN ENTRY  Current Barriers:  . Chronic Disease Management support, education, and care coordination needs related to HTN, HLD, and Asthma, GERD, Hypothyroidism, Osteopenia  Pharmacist Clinical Goal(s):  Marland Kitchen Asthma . Prevent worsening of shortness of breath and hospitalizations. . High blood pressure:  Marland Kitchen Maintain Blood pressure <130/80 mmHg. Marland Kitchen Recent reported home blood pressure: 101-117/ 60-68 mmHg. . High cholesterol:  . Cholesterol goals: Total Cholesterol goal under 200, Triglycerides goal under 150, HDL goal above 40 (men) or above 50 (women), LDL goal under 100.  . Current cholesterol levels (10/21/2017): - Total cholesterol: 267 - Triglycerides: 168 - HDL: 62 - LDL: 171 . Hypothyroidism:  . Maintain TSH between 0.45 to 4.5uIU/ml. . Current TSH: 2.71 (10/21/2017) . Heart burn: Minimize reflux symptoms.   Interventions: . Comprehensive medication review performed. . Collaboration with provider re: medication management . Blood pressure:  . Discussed need to continue checking blood pressure at home.  . Discussed diet modifications. DASH diet:  following a diet emphasizing fruits and vegetables and low-fat dairy products along with whole grains, fish, poultry, and nuts. Reducing red meats and sugars.  . Exercising- see exercising section below  . Reducing the amount of salt intake to 1500mg /per day.  . Recommend using a salt substitute to replace your salt if you need flavor.    . Weight reduction- We discussed losing 5-10% of body weight . Managed by Dr. Elberta Fortis.  . Continue: amlodipine 5mg ,1 tablet daily and metoprolol tartrate 50mg , 1 tablet twice daily.  . High cholesterol . How to reduce cholesterol through diet/weight management and physical activity.    . We discussed how a diet high in plant sterols (fruits/vegetables/nuts/whole grains/legumes) may reduce your  cholesterol.  Encouraged increasing fiber to a daily intake of 10-25g/day  . Continue: diet modification.  . Recommend annual cholesterol blood work.  . Asthma . Continue:  - Ipratropium/ albuterol 0.5/2.5mg / 15ml, 1 vial via nebulizer every four hours as needed - montelukast (Singulair) 10mg , 1 tablet daily - budesonide/ formoterol (Symbicort 80-4.70mcg/ act inhaler), inhaler 2 puffs twice daily - albuterol (Proair HFA), 2 puffs every four hours as needed. . Discussed rinsing mouth after each use of budesonide/formoterol (Symbicort)  . Hypothyroidism . Recommend annual thyroid blood work.  . Discussed taking thyroid medication consistent in regards to food and other medications.  . Continue:NP thyroid 60mg , 1 tablet daily  . GERD: . Discussed non-pharmacological interventions for acid reflux. Take measures to prevent acid reflux, such as avoiding spicy foods, avoiding caffeine, avoid laying down a few hours after eating, and raising the head of the bed. . Continue: omeprazole 40mg , 1 capsule daily and sodium bicarbonate, 1 tablet daily.  . Bone health . Continue:  calcium carbonate/ vitamin D, 1200mg , 1 tablet daily and cholecalciferol 1,000 units daily  . Recommend obtaining vitamin level blood check.  Patient Self Care Activities:  . Self administers medications as prescribed and Calls provider office for new concerns or questions . Continue current medications as directed by providers.  . Continue following up with your primary care provider and specialists. . Continue at home blood pressure readings.  . Continue working on health habits (diet/ exercise).  Initial goal documentation        Whitney Evans was given information about Chronic Care Management services today including:  1. CCM service  includes personalized support from designated clinical staff supervised by her physician, including individualized plan of care and coordination with other care providers 2. 24/7  contact phone numbers for assistance for urgent and routine care needs. 3. Standard insurance, coinsurance, copays and deductibles apply for chronic care management only during months in which we provide at least 20 minutes of these services. Most insurances cover these services at 100%, however patients may be responsible for any copay, coinsurance and/or deductible if applicable. This service may help you avoid the need for more expensive face-to-face services. 4. Only one practitioner may furnish and bill the service in a calendar month. 5. The patient may stop CCM services at any time (effective at the end of the month) by phone call to the office staff.  Patient agreed to services and verbal consent obtained.   The patient verbalized understanding of instructions provided today and agreed to receive a mailed copy of patient instruction and/or educational materials. Telephone follow up appointment with pharmacy team member scheduled for: 05/24/2020.    Verbal consent obtained for [facility] enhanced pharmacy services (medication synchronization, adherence packaging, delivery coordination). A medication sync plan was created to allow patient to get all medications delivered once every 30 to 90 days per patient preference. Patient understands they have freedom to choose pharmacy and clinical pharmacist will coordinate care between all prescribers and [facility].   Anson Crofts, PharmD Clinical Pharmacist [facility] [phone]   High Cholesterol  High cholesterol is a condition in which the blood has high levels of a white, waxy, fat-like substance (cholesterol). The human body needs small amounts of cholesterol. The liver makes all the cholesterol that the body needs. Extra (excess) cholesterol comes from the food that we eat. Cholesterol is carried from the liver by the blood through the blood vessels. If you have high cholesterol, deposits  (plaques) may build up on the walls of your blood vessels (arteries). Plaques make the arteries narrower and stiffer. Cholesterol plaques increase your risk for heart attack and stroke. Work with your health care provider to keep your cholesterol levels in a healthy range. What increases the risk? This condition is more likely to develop in people who:  Eat foods that are high in animal fat (saturated fat) or cholesterol.  Are overweight.  Are not getting enough exercise.  Have a family history of high cholesterol. What are the signs or symptoms? There are no symptoms of this condition. How is this diagnosed? This condition may be diagnosed from the results of a blood test.  If you are older than age 6, your health care provider may check your cholesterol every 4-6 years.  You may be checked more often if you already have high cholesterol or other risk factors for heart disease. The blood test for cholesterol measures:  "Bad" cholesterol (LDL cholesterol). This is the main type of cholesterol that causes heart disease. The desired level for LDL is less than 100.  "Good" cholesterol (HDL cholesterol). This type helps to protect against heart disease by cleaning the arteries and carrying the LDL away. The desired level for HDL is 60 or higher.  Triglycerides. These are fats that the body can store or burn for energy. The desired number for triglycerides is lower than 150.  Total cholesterol. This is a measure of the total amount of cholesterol in your blood, including LDL cholesterol, HDL cholesterol, and triglycerides. A healthy number is less than 200. How is this treated? This  condition is treated with diet changes, lifestyle changes, and medicines. Diet changes  This may include eating more whole grains, fruits, vegetables, nuts, and fish.  This may also include cutting back on red meat and foods that have a lot of added sugar. Lifestyle changes  Changes may include getting at  least 40 minutes of aerobic exercise 3 times a week. Aerobic exercises include walking, biking, and swimming. Aerobic exercise along with a healthy diet can help you maintain a healthy weight.  Changes may also include quitting smoking. Medicines  Medicines are usually given if diet and lifestyle changes have failed to reduce your cholesterol to healthy levels.  Your health care provider may prescribe a statin medicine. Statin medicines have been shown to reduce cholesterol, which can reduce the risk of heart disease. Follow these instructions at home: Eating and drinking If told by your health care provider:  Eat chicken (without skin), fish, veal, shellfish, ground [place] breast, and round or loin cuts of red meat.  Do not eat fried foods or fatty meats, such as hot dogs and salami.  Eat plenty of fruits, such as apples.  Eat plenty of vegetables, such as broccoli, potatoes, and carrots.  Eat beans, peas, and lentils.  Eat grains such as barley, rice, couscous, and bulgur wheat.  Eat pasta without cream sauces.  Use skim or nonfat milk, and eat low-fat or nonfat yogurt and cheeses.  Do not eat or drink whole milk, cream, ice cream, egg yolks, or hard cheeses.  Do not eat stick margarine or tub margarines that contain trans fats (also called partially hydrogenated oils).  Do not eat saturated tropical oils, such as coconut oil and palm oil.  Do not eat cakes, cookies, crackers, or other baked goods that contain trans fats.  General instructions  Exercise as directed by your health care provider. Increase your activity level with activities such as gardening, walking, and taking the stairs.  Take over-the-counter and prescription medicines only as told by your health care provider.  Do not use any products that contain nicotine or tobacco, such as cigarettes and e-cigarettes. If you need help quitting, ask your health care provider.  Keep all follow-up visits as told by  your health care provider. This is important. Contact a health care provider if:  You are struggling to maintain a healthy diet or weight.  You need help to start on an exercise program.  You need help to stop smoking. Get help right away if:  You have chest pain.  You have trouble breathing. This information is not intended to replace advice given to you by your health care provider. Make sure you discuss any questions you have with your health care provider. Document Revised: 08/27/2017 Document Reviewed: 02/22/2016 Elsevier Patient Education  2020 [employer].

## 2019-11-22 NOTE — Telephone Encounter (Signed)
I had CCM visit with patient. Patient is due for routine labs (including lipid panel, TSH, and vitamin D) and physical.  Patient also consented to [facility] services.  I am requesting refills sent to [facility] for:  - Symbicort inhaler - Montelukast 10mg  - Omeprazole 40mg  - NP thyroid 60mg  - Amlodipine 5mg  - Albuterol sulfate HFA inhaler  - Ipratropium/ albuterol 0.5-2.5mg / 21mL nebulized solution.

## 2019-11-22 NOTE — Telephone Encounter (Signed)
Done

## 2019-12-03 ENCOUNTER — Other Ambulatory Visit: Payer: Self-pay | Admitting: Family Medicine

## 2019-12-03 ENCOUNTER — Other Ambulatory Visit: Payer: Self-pay | Admitting: Cardiology

## 2019-12-13 ENCOUNTER — Ambulatory Visit (INDEPENDENT_AMBULATORY_CARE_PROVIDER_SITE_OTHER): Payer: PPO | Admitting: *Deleted

## 2019-12-13 DIAGNOSIS — I442 Atrioventricular block, complete: Secondary | ICD-10-CM

## 2019-12-15 LAB — CUP PACEART REMOTE DEVICE CHECK
Battery Remaining Longevity: 118 mo
Battery Remaining Percentage: 95.5 %
Battery Voltage: 3.01 V
Brady Statistic AP VP Percent: 2.6 %
Brady Statistic AP VS Percent: 43 %
Brady Statistic AS VP Percent: 9 %
Brady Statistic AS VS Percent: 45 %
Brady Statistic RA Percent Paced: 44 %
Brady Statistic RV Percent Paced: 12 %
Date Time Interrogation Session: 20210409021350
Implantable Lead Implant Date: 20171012
Implantable Lead Implant Date: 20171012
Implantable Lead Location: 753859
Implantable Lead Location: 753860
Implantable Pulse Generator Implant Date: 20171012
Lead Channel Impedance Value: 430 Ohm
Lead Channel Impedance Value: 560 Ohm
Lead Channel Pacing Threshold Amplitude: 0.5 V
Lead Channel Pacing Threshold Amplitude: 0.75 V
Lead Channel Pacing Threshold Pulse Width: 0.5 ms
Lead Channel Pacing Threshold Pulse Width: 0.5 ms
Lead Channel Sensing Intrinsic Amplitude: 1.6 mV
Lead Channel Sensing Intrinsic Amplitude: 5.9 mV
Lead Channel Setting Pacing Amplitude: 2 V
Lead Channel Setting Pacing Amplitude: 2.5 V
Lead Channel Setting Pacing Pulse Width: 0.5 ms
Lead Channel Setting Sensing Sensitivity: 2 mV
Pulse Gen Model: 2272
Pulse Gen Serial Number: 7955201

## 2019-12-15 NOTE — Progress Notes (Signed)
PPM Remote  

## 2019-12-25 ENCOUNTER — Telehealth: Payer: Self-pay

## 2019-12-25 NOTE — Telephone Encounter (Signed)
Reviewed chart for medication changes ahead of medication coordination call.  No OVs, Consults, or hospital visits since last care coordination call/Pharmacist visit.  No medication changes indicated.   BP Readings from Last 3 Encounters:  08/21/19 106/67  06/21/18 124/70  10/21/17 108/62    Lab Results  Component Value Date   HGBA1C 5.8 11/28/2009     Patient obtains medications through Vials  90 Days   Patient is due for next adherence delivery on: 01/01/2020 Called patient and reviewed medications and coordinated delivery.  This delivery to include: Omeprazole 40mg  daily  Montelukast 10mg  daily  Amlodipine 5mg  daily  Metoprolol Tartrate 50 mg one twice daily  Np Thyroid 60mg  daily   Patient declined the following medications (meds) due to: Symbicort 46mcg/ 4.36mcg inhaler (last filled 12/15/19 for 90DS) Albuterol HFA inhaler (uses as needed) Ipratropium/albuterl nebulized solution (uses as needed)   Patient needs refills for Metoprolol tartrate 50mg  (1 tablet twice daily). Dr. office contacted requesting refill. Patient needs appointment before more refills will be issued.   Confirmed delivery date of 01/01/2020, advised patient that pharmacy will contact them the morning of delivery or day before.  4m, PharmD Clinical Pharmacist [facility] [place] [phone]

## 2019-12-27 ENCOUNTER — Other Ambulatory Visit: Payer: Self-pay | Admitting: Cardiology

## 2020-01-04 ENCOUNTER — Other Ambulatory Visit: Payer: Self-pay | Admitting: Family Medicine

## 2020-01-09 ENCOUNTER — Encounter: Payer: PPO | Admitting: Cardiology

## 2020-02-01 DIAGNOSIS — H4710 Unspecified papilledema: Secondary | ICD-10-CM | POA: Diagnosis not present

## 2020-02-01 DIAGNOSIS — H2513 Age-related nuclear cataract, bilateral: Secondary | ICD-10-CM | POA: Diagnosis not present

## 2020-02-01 DIAGNOSIS — H2511 Age-related nuclear cataract, right eye: Secondary | ICD-10-CM | POA: Diagnosis not present

## 2020-02-01 DIAGNOSIS — H25013 Cortical age-related cataract, bilateral: Secondary | ICD-10-CM | POA: Diagnosis not present

## 2020-02-01 DIAGNOSIS — H25043 Posterior subcapsular polar age-related cataract, bilateral: Secondary | ICD-10-CM | POA: Diagnosis not present

## 2020-02-06 ENCOUNTER — Other Ambulatory Visit (HOSPITAL_COMMUNITY): Payer: Self-pay | Admitting: Ophthalmology

## 2020-02-06 ENCOUNTER — Other Ambulatory Visit: Payer: Self-pay | Admitting: Ophthalmology

## 2020-02-06 DIAGNOSIS — H4710 Unspecified papilledema: Secondary | ICD-10-CM

## 2020-02-09 ENCOUNTER — Other Ambulatory Visit (HOSPITAL_COMMUNITY): Payer: Self-pay | Admitting: Ophthalmology

## 2020-02-09 DIAGNOSIS — H4710 Unspecified papilledema: Secondary | ICD-10-CM

## 2020-02-14 ENCOUNTER — Other Ambulatory Visit: Payer: Self-pay

## 2020-02-14 ENCOUNTER — Ambulatory Visit (HOSPITAL_COMMUNITY)
Admission: RE | Admit: 2020-02-14 | Discharge: 2020-02-14 | Disposition: A | Discharge status: Home / Self Care | Payer: PPO | Source: Ambulatory Visit | Attending: Ophthalmology | Admitting: Ophthalmology

## 2020-02-14 ENCOUNTER — Ambulatory Visit (HOSPITAL_COMMUNITY): Payer: PPO

## 2020-02-14 ENCOUNTER — Other Ambulatory Visit (HOSPITAL_COMMUNITY): Payer: Self-pay | Admitting: Ophthalmology

## 2020-02-14 ENCOUNTER — Encounter (HOSPITAL_COMMUNITY): Payer: Self-pay

## 2020-02-14 DIAGNOSIS — H4710 Unspecified papilledema: Secondary | ICD-10-CM | POA: Diagnosis not present

## 2020-02-14 LAB — POCT I-STAT CREATININE: Creatinine, Ser: 0.8 mg/dL (ref 0.44–1.00)

## 2020-02-14 MED ORDER — IOHEXOL 300 MG/ML  SOLN
75.0000 mL | Freq: Once | INTRAMUSCULAR | Status: AC | PRN
  Administered 2020-02-14: 75 mL via INTRAVENOUS

## 2020-02-15 ENCOUNTER — Encounter: Payer: Self-pay | Admitting: Cardiology

## 2020-02-15 ENCOUNTER — Ambulatory Visit: Payer: PPO | Admitting: Cardiology

## 2020-02-15 VITALS — BP 136/70 | HR 102 | Ht 65.0 in

## 2020-02-15 DIAGNOSIS — H4710 Unspecified papilledema: Secondary | ICD-10-CM | POA: Diagnosis not present

## 2020-02-15 DIAGNOSIS — I442 Atrioventricular block, complete: Secondary | ICD-10-CM | POA: Diagnosis not present

## 2020-02-15 DIAGNOSIS — H53451 Other localized visual field defect, right eye: Secondary | ICD-10-CM | POA: Diagnosis not present

## 2020-02-15 LAB — CUP PACEART INCLINIC DEVICE CHECK
Battery Remaining Longevity: 128 mo
Battery Voltage: 2.99 V
Brady Statistic RA Percent Paced: 43 %
Brady Statistic RV Percent Paced: 17 %
Date Time Interrogation Session: 20210610141700
Implantable Lead Implant Date: 20171012
Implantable Lead Implant Date: 20171012
Implantable Lead Location: 753859
Implantable Lead Location: 753860
Implantable Pulse Generator Implant Date: 20171012
Lead Channel Impedance Value: 462.5 Ohm
Lead Channel Impedance Value: 612.5 Ohm
Lead Channel Pacing Threshold Amplitude: 0.5 V
Lead Channel Pacing Threshold Amplitude: 0.5 V
Lead Channel Pacing Threshold Pulse Width: 0.5 ms
Lead Channel Pacing Threshold Pulse Width: 0.5 ms
Lead Channel Sensing Intrinsic Amplitude: 2.8 mV
Lead Channel Sensing Intrinsic Amplitude: 5.4 mV
Lead Channel Setting Pacing Amplitude: 2 V
Lead Channel Setting Pacing Amplitude: 2.5 V
Lead Channel Setting Pacing Pulse Width: 0.5 ms
Lead Channel Setting Sensing Sensitivity: 2 mV
Pulse Gen Model: 2272
Pulse Gen Serial Number: 7955201

## 2020-02-15 MED ORDER — METOPROLOL TARTRATE 50 MG PO TABS: ORAL_TABLET | ORAL | 2 refills | Status: DC

## 2020-02-15 NOTE — Patient Instructions (Signed)
Medication Instructions:  Your physician recommends that you continue on your current medications as directed. Please refer to the Current Medication list given to you today.  *If you need a refill on your cardiac medications before your next appointment, please call your pharmacy*   Lab Work: None ordered If you have labs (blood work) drawn today and your tests are completely normal, you will receive your results only by: Marland Kitchen MyChart Message (if you have MyChart) OR . A paper copy in the mail If you have any lab test that is abnormal or we need to change your treatment, we will call you to review the results.   Testing/Procedures: None ordered   Follow-Up: Remote monitoring is used to monitor your Pacemaker of ICD from home. This monitoring reduces the number of office visits required to check your device to one time per year. It allows [place] to keep an eye on the functioning of your device to ensure it is working properly. You are scheduled for a device check from home on 03/13/20. You may send your transmission at any time that day. If you have a wireless device, the transmission will be sent automatically. After your physician reviews your transmission, you will receive a postcard with your next transmission date.  At [facility], you and your health needs are our priority.  As part of our continuing mission to provide you with exceptional heart care, we have created designated Provider Care Teams.  These Care Teams include your primary Cardiologist (physician) and Advanced Practice Providers (APPs -  Physician Assistants and Nurse Practitioners) who all work together to provide you with the care you need, when you need it.  We recommend signing up for the patient portal called "MyChart".  Sign up information is provided on this After Visit Summary.  MyChart is used to connect with patients for Virtual Visits (Telemedicine).  Patients are able to view lab/test results, encounter notes,  upcoming appointments, etc.  Non-urgent messages can be sent to your provider as well.   To learn more about what you can do with MyChart, go to [email or web address].    Your next appointment:   1 year(s)  The format for your next appointment:   In Person  Provider:   Loman Brooklyn, MD   Thank you for choosing [facility]!!   Dory Horn, RN [phone]    Other Instructions

## 2020-02-15 NOTE — Progress Notes (Signed)
Electrophysiology Office Note   Date:  02/15/2020   ID:  Whitney Evans, DOB [name] 21, 1946, MRN [id number]  PCP:  Whitney Salisbury, MD  Cardiologist:  Whitney Evans Primary Electrophysiologist:  Whitney Evans Whitney Loa, MD    No chief complaint on file.    History of Present Illness: Whitney Evans is a 74 y.o. female who presents today for electrophysiology evaluation.   She was admitted to the hospital in October 2017 with syncopal event and was found to be in complete heart block with heart rates in the 30s. No reversible causes found and she therefore underwent [employer] Jude dual-chamber pacemaker implant on 06/18/16. She returns today for follow-up.   Today, denies symptoms of palpitations, chest pain, shortness of breath, orthopnea, PND, lower extremity edema, claudication, dizziness, presyncope, syncope, bleeding, or neurologic sequela. The patient is tolerating medications without difficulties.  Overall she is doing well.  She has no chest pain or shortness of breath.  Is able to all of her daily activities.  She is having some eye troubles and is following up with physicians in [facility].   Past Medical History:  Diagnosis Date  . Allergic rhinitis   . Asthma   . Chronic hoarseness    sees Dr. Benay Evans (ENT) in [place]   . Essential hypertension   . GERD (gastroesophageal reflux disease)   . Hypothyroidism   . Insomnia   . Neck pain, chronic    Past Surgical History:  Procedure Laterality Date  . CESAREAN SECTION     x 2  . COLONOSCOPY  05/24/07   per Dr. Russella Evans, benign polpys, and diverticulosis, repeat 5 years   . EP IMPLANTABLE DEVICE N/A 06/18/2016   Procedure: Pacemaker Implant;  Surgeon: Whitney Evans Whitney Loa, MD;  Location: [facility] INVASIVE CV LAB;  Service: Cardiovascular;  Laterality: N/A;  . EYE SURGERY    . left foot surgery    . VESICOVAGINAL FISTULA CLOSURE W/ TAH    . WISDOM TOOTH EXTRACTION       Current Outpatient Medications  Medication Sig Dispense Refill    . albuterol (PROAIR HFA) 108 (90 Base) MCG/ACT inhaler TAKE 2 PUFFS BY MOUTH EVERY 4 HOURS AS NEEDED 25.5 g 3  . amLODipine (NORVASC) 5 MG tablet Take 1 tablet (5 mg total) by mouth daily. 90 tablet 3  . BLACK PEPPER-TURMERIC PO Take by mouth.    . budesonide-formoterol (SYMBICORT) 80-4.5 MCG/ACT inhaler Inhale 2 puffs into the lungs 2 (two) times daily. 3 Inhaler 3  . Calcium Carbonate-Vit D-Min (CALCIUM 1200 PO) Take 1,200 mg by mouth daily.    . Cholecalciferol 25 MCG (1000 UT) tablet Take 1,000 Units by mouth daily.    . Coenzyme Q10 (CO Q 10 PO) Take 100 mg by mouth daily.     Marland Kitchen ipratropium-albuterol (DUONEB) 0.5-2.5 (3) MG/3ML SOLN Take 3 mLs by nebulization every 4 (four) hours as needed (asthma). 360 mL 3  . MAGNESIUM PO Take 450 mg by mouth at bedtime.     . metoprolol tartrate (LOPRESSOR) 50 MG tablet TAKE ONE TABLET BY MOUTH TWICE DAILY. *NEEDS APPOINTMENT FOR FURTHER REFILLS* 180 tablet 2  . montelukast (SINGULAIR) 10 MG tablet Take 1 tablet (10 mg total) by mouth daily. 90 tablet 3  . Multiple Vitamin (MULTIVITAMIN WITH MINERALS) TABS tablet Take 1 tablet by mouth daily.    . NP THYROID 60 MG tablet Take 1 tablet (60 mg total) by mouth daily. 90 tablet 3  . Omega-3 Fatty Acids (FISH OIL PO)  Take 1,300 mg by mouth daily.     Marland Kitchen omeprazole (PRILOSEC) 40 MG capsule TAKE 1 CAPSULE BY MOUTH EVERY DAY 90 capsule 3   No current facility-administered medications for this visit.    Allergies:   Morphine sulfate and Pneumococcal vaccine polyvalent   Social History:  The patient  reports that she has quit smoking. She has never used smokeless tobacco. She reports current alcohol use of about 2.0 standard drinks of alcohol per week. She reports that she does not use drugs.   Family History:  The patient's family history includes Coronary artery disease in an other family member; Diabetes in an other family member; Heart attack (age of onset: 50) in her father; Hyperlipidemia in an other  family member; Hypertension in an other family member; Stroke in her mother and another family member.   ROS:  Please see the history of present illness.   Otherwise, review of systems is positive for none.   All other systems are reviewed and negative.   PHYSICAL EXAM: VS:  BP 136/70   Pulse (!) 102   Ht 5\' 5"  (1.651 m)   SpO2 94%   BMI 32.95 kg/m  , BMI Body mass index is 32.95 kg/m. GEN: Well nourished, well developed, in no acute distress  HEENT: normal  Neck: no JVD, carotid bruits, or masses Cardiac: RRR; no murmurs, rubs, or gallops,no edema  Respiratory:  clear to auscultation bilaterally, normal work of breathing GI: soft, nontender, nondistended, + BS MS: no deformity or atrophy  Skin: warm and dry, device site well healed Neuro:  Strength and sensation are intact Psych: euthymic mood, full affect  EKG:  EKG is ordered today. Personal review of the ekg ordered shows sinus rhythm, ventricular paced, rate of 102  Personal review of the device interrogation today. Results in [facility]    Recent Labs: 02/14/2020: Creatinine, Ser 0.80    Lipid Panel     Component Value Date/Time   CHOL 267 (H) 10/21/2017 0907   TRIG 168.0 (H) 10/21/2017 0907   HDL 62.20 10/21/2017 0907   CHOLHDL 4 10/21/2017 0907   VLDL 33.6 10/21/2017 0907   LDLCALC 171 (H) 10/21/2017 0907   LDLDIRECT 173.6 12/03/2011 0948     Wt Readings from Last 3 Encounters:  08/21/19 198 lb (89.8 kg)  06/21/18 213 lb (96.6 kg)  10/21/17 206 lb (93.4 kg)      Other studies Reviewed: Additional studies/ records that were reviewed today include: TTE 06/18/16  Review of the above records today demonstrates:  - Left ventricle: The cavity size was normal. Wall thickness was   normal. Systolic function was normal. The estimated ejection   fraction was in the range of 60% to 65%. Wall motion was normal;   there were no regional wall motion abnormalities. The study is   not technically sufficient to allow  evaluation of LV diastolic   function. - Aortic valve: There was trivial regurgitation. - Mitral valve: There was moderate regurgitation. - Left atrium: The atrium was mildly dilated.   ASSESSMENT AND PLAN:  1.  Complete heart block: Status post [facility] Jude dual-chamber pacemaker implanted 1210 1217.  Device functioning appropriately.  Has had some atrial tachycardia which she is unaware of.  No changes.    2. Hypertension: Currently well controlled  3. Moderate mitral regurgitation: No obvious symptoms    Current medicines are reviewed at length with the patient today.   The patient does not have concerns regarding her medicines.  The  following changes were made today: None  Labs/ tests ordered today include:  Orders Placed This Encounter  Procedures  . EKG 12-Lead     Disposition:   FU with Daiki Dicostanzo 12 months  Signed, Kollyn Lingafelter Meredith Leeds, MD  02/15/2020 2:36 PM     [place] [address] [phone] (office) [phone] (fax)

## 2020-02-29 DIAGNOSIS — H4710 Unspecified papilledema: Secondary | ICD-10-CM | POA: Diagnosis not present

## 2020-03-01 ENCOUNTER — Ambulatory Visit (HOSPITAL_COMMUNITY)
Admission: RE | Admit: 2020-03-01 | Discharge: 2020-03-01 | Disposition: A | Discharge status: Home / Self Care | Payer: PPO | Source: Ambulatory Visit | Attending: Ophthalmology | Admitting: Ophthalmology

## 2020-03-01 ENCOUNTER — Ambulatory Visit (HOSPITAL_COMMUNITY): Payer: PPO

## 2020-03-01 ENCOUNTER — Other Ambulatory Visit: Payer: Self-pay

## 2020-03-01 DIAGNOSIS — H4710 Unspecified papilledema: Secondary | ICD-10-CM | POA: Insufficient documentation

## 2020-03-01 MED ORDER — GADOBUTROL 1 MMOL/ML IV SOLN
9.0000 mL | Freq: Once | INTRAVENOUS | Status: AC | PRN
  Administered 2020-03-01: 9 mL via INTRAVENOUS

## 2020-03-01 NOTE — Progress Notes (Signed)
Informed of MRI for today.   Device system confirmed to be MRI conditional, with implant date > 6 weeks ago and no evidence of abandoned or epicardial leads in review of most recent CXR Interrogation from today reviewed, pt is currently AS-VP at 95 bpm -> gradually slowed to ~ 80 bpm.  Change device settings for MRI to DOO at 100 bpm after discussion with [employer]. Jude. Can try to go lower to 95 if patient symptomatic at 100.   Tachy-therapies to off if applicable  Program device back to pre-MRI settings after completion of exam.  Graciella Freer, PA-C  03/01/2020 1:23 PM

## 2020-03-06 ENCOUNTER — Ambulatory Visit (INDEPENDENT_AMBULATORY_CARE_PROVIDER_SITE_OTHER): Payer: PPO | Admitting: Family Medicine

## 2020-03-06 ENCOUNTER — Encounter: Payer: Self-pay | Admitting: Family Medicine

## 2020-03-06 ENCOUNTER — Other Ambulatory Visit: Payer: Self-pay

## 2020-03-06 VITALS — BP 126/62 | HR 71 | Temp 97.5°F | Wt 195.0 lb

## 2020-03-06 DIAGNOSIS — E785 Hyperlipidemia, unspecified: Secondary | ICD-10-CM | POA: Diagnosis not present

## 2020-03-06 DIAGNOSIS — H47091 Other disorders of optic nerve, not elsewhere classified, right eye: Secondary | ICD-10-CM | POA: Diagnosis not present

## 2020-03-06 DIAGNOSIS — E039 Hypothyroidism, unspecified: Secondary | ICD-10-CM | POA: Diagnosis not present

## 2020-03-06 DIAGNOSIS — I10 Essential (primary) hypertension: Secondary | ICD-10-CM

## 2020-03-06 LAB — BASIC METABOLIC PANEL
BUN: 18 mg/dL (ref 6–23)
CO2: 28 mEq/L (ref 19–32)
Calcium: 9.9 mg/dL (ref 8.4–10.5)
Chloride: 103 mEq/L (ref 96–112)
Creatinine, Ser: 0.87 mg/dL (ref 0.40–1.20)
GFR: 64.03 mL/min (ref >60.00)
Glucose, Bld: 111 mg/dL — ABNORMAL HIGH (ref 70–99)
Potassium: 5 mEq/L (ref 3.5–5.1)
Sodium: 139 mEq/L (ref 135–145)

## 2020-03-06 LAB — CBC WITH DIFFERENTIAL/PLATELET
Basophils Absolute: 0.1 10*3/uL (ref 0.0–0.1)
Basophils Relative: 1.3 % (ref 0.0–3.0)
Eosinophils Absolute: 0.1 10*3/uL (ref 0.0–0.7)
Eosinophils Relative: 1 % (ref 0.0–5.0)
HCT: 40.8 % (ref 36.0–46.0)
Hemoglobin: 13.3 g/dL (ref 12.0–15.0)
Lymphocytes Relative: 21.9 % (ref 12.0–46.0)
Lymphs Abs: 2.1 10*3/uL (ref 0.7–4.0)
MCHC: 32.7 g/dL (ref 30.0–36.0)
MCV: 85.8 fl (ref 78.0–100.0)
Monocytes Absolute: 0.7 10*3/uL (ref 0.1–1.0)
Monocytes Relative: 6.9 % (ref 3.0–12.0)
Neutro Abs: 6.7 10*3/uL (ref 1.4–7.7)
Neutrophils Relative %: 68.9 % (ref 43.0–77.0)
Platelets: 368 10*3/uL (ref 150.0–400.0)
RBC: 4.76 Mil/uL (ref 3.87–5.11)
RDW: 14.9 % (ref 11.5–15.5)
WBC: 9.7 10*3/uL (ref 4.0–10.5)

## 2020-03-06 LAB — TSH: TSH: 1.84 u[IU]/mL (ref 0.35–4.50)

## 2020-03-06 LAB — LIPID PANEL
Cholesterol: 250 mg/dL — ABNORMAL HIGH (ref 0–200)
HDL: 63.1 mg/dL (ref >39.00)
LDL Cholesterol: 152 mg/dL — ABNORMAL HIGH (ref 0–99)
NonHDL: 186.45
Total CHOL/HDL Ratio: 4
Triglycerides: 172 mg/dL — ABNORMAL HIGH (ref 0.0–149.0)
VLDL: 34.4 mg/dL (ref 0.0–40.0)

## 2020-03-06 LAB — HEPATIC FUNCTION PANEL
ALT: 19 U/L (ref 0–35)
AST: 14 U/L (ref 0–37)
Albumin: 4.3 g/dL (ref 3.5–5.2)
Alkaline Phosphatase: 77 U/L (ref 39–117)
Bilirubin, Direct: 0.1 mg/dL (ref 0.0–0.3)
Total Bilirubin: 0.4 mg/dL (ref 0.2–1.2)
Total Protein: 7.1 g/dL (ref 6.0–8.3)

## 2020-03-06 LAB — SEDIMENTATION RATE: Sed Rate: 62 mm/hr — ABNORMAL HIGH (ref 0–30)

## 2020-03-06 LAB — T4, FREE: Free T4: 0.7 ng/dL (ref 0.60–1.60)

## 2020-03-06 LAB — C-REACTIVE PROTEIN: CRP: <1 mg/dL (ref 0.5–20.0)

## 2020-03-06 LAB — T3, FREE: T3, Free: 3.9 pg/mL (ref 2.3–4.2)

## 2020-03-06 MED ORDER — METOPROLOL SUCCINATE ER 100 MG PO TB24
100.0000 mg | ORAL_TABLET | Freq: Every day | ORAL | 3 refills | Status: DC
Start: 2020-03-06 — End: 2021-03-13

## 2020-03-06 NOTE — Progress Notes (Signed)
   Subjective:    Patient ID: Whitney Evans, female    DOB: [birth year], 74 y.o.   MRN: [id number]  HPI Here for multiple issues. First she had a routine eye exam a few months ago and the eye doctor thought something was wrong with her right optic nerve (her vision has been fine). She then saw a neuroophthalmologist at [facility] named Dr. Suzzette Evans who ordered a head CT, brain MRI, and optic nerve MRI's. These showed an enlargement of the right optic nerve which was felt to be a likely meningioma, but many other etiologies were possible. Dr. Darel Evans has a long list of blood tests she has ordered, and Whitney Evans asks [place] to run these. Also 3 weeks ago she felt a pain in the lower right back after she had the MRI scan, and she thought this was a pulled muscle. Since then it has been slowly improving with heat, stretches and Tylenol. Finally her BP has been great in the mornings but around 7 pm every day it goes up. She takes her Metoprolol tartrate at 7 am and at 9 pm every day. Later in the evenings the BP goes back down. She takes Amlodipine in the mornings.    Review of Systems  Constitutional: Negative.   Eyes: Negative.   Respiratory: Negative.   Cardiovascular: Negative.   Musculoskeletal: Positive for back pain.  Neurological: Negative.        Objective:   Physical Exam Constitutional:      Appearance: Normal appearance.  Cardiovascular:     Rate and Rhythm: Normal rate and regular rhythm.     Pulses: Normal pulses.     Heart sounds: Normal heart sounds.  Pulmonary:     Effort: Pulmonary effort is normal.     Breath sounds: Normal breath sounds.  Musculoskeletal:     Comments: Her back is normal with full ROM and no tenderness   Neurological:     Mental Status: She is alert.           Assessment & Plan:  She has an enlargement of the right optic nerve which could be due to a meningioma, sarcoidosis, infectious disease, etc. We will run all the tests that Whitney Evans has  requested. For the BP we will change the Metoprolol to succinate 100 mg to take every morning to avoid the drop offs she is getting in the early evenings. The muscle strain in the back has resolved.  Gershon Crane, MD

## 2020-03-07 ENCOUNTER — Ambulatory Visit: Payer: Self-pay

## 2020-03-07 NOTE — Chronic Care Management (AMB) (Signed)
Received message from [facility] verifying prescription change from metoprolol tartrate to metoprolol succinate.  Per chart review, patient had visit wit Dr. Clent Ridges on 03/06/20, where this change was made.   Called patient to coordinate delivery and assess need for other medications.   Coordinated delivery for metoprolol succinate 100mg , 1 tablet once daily for  03/07/2020 to be filled enough to get to sync date of 03/26/2020. (next date all medications are due).    Acute form filled and sent to [facility]. Patient denied needing delivery coordination of other medications.  03/28/2020, PharmD Clinical Pharmacist [facility] [place] [phone]

## 2020-03-08 LAB — ANCA TITERS
Atypical pANCA: <1:20 {titer}
C-ANCA: <1:20 {titer}
P-ANCA: <1:20 {titer}

## 2020-03-12 NOTE — Addendum Note (Signed)
Addended by: Gershon Crane A on: 03/12/2020 07:39 AM   Modules accepted: Orders

## 2020-03-13 ENCOUNTER — Ambulatory Visit (INDEPENDENT_AMBULATORY_CARE_PROVIDER_SITE_OTHER): Payer: PPO | Admitting: *Deleted

## 2020-03-13 DIAGNOSIS — I442 Atrioventricular block, complete: Secondary | ICD-10-CM | POA: Diagnosis not present

## 2020-03-13 LAB — ANTI-NUCLEAR AB-TITER (ANA TITER)
ANA TITER: 1:80 {titer} — ABNORMAL HIGH
ANA Titer 1: 1:80 {titer} — ABNORMAL HIGH

## 2020-03-13 LAB — BARTONELLA ANTIBODY PANEL
B. henselae IgG Screen: NEGATIVE
B. henselae IgM Screen: NEGATIVE

## 2020-03-13 LAB — VARICELLA ZOSTER ANTIBODY, IGG: Varicella IgG: 3694 index

## 2020-03-13 LAB — LYSOZYME, SERUM: Lysozyme, Serum: 8 ug/mL (ref 5.0–11.0)

## 2020-03-13 LAB — RPR TITER: RPR Titer: 1:1 {titer} — ABNORMAL HIGH

## 2020-03-13 LAB — TOXOPLASMA ANTIBODIES- IGG AND  IGM
Toxoplasma Antibody- IgM: <8 AU/mL
Toxoplasma IgG Ratio: <7.2 IU/mL

## 2020-03-13 LAB — B. BURGDORFI ANTIBODIES: B burgdorferi Ab IgG+IgM: <0.9 index

## 2020-03-13 LAB — RPR: RPR Ser Ql: REACTIVE — AB

## 2020-03-13 LAB — HLA-B27 ANTIGEN: HLA-B27 Antigen: NEGATIVE

## 2020-03-13 LAB — ANGIOTENSIN CONVERTING ENZYME: Angiotensin-Converting Enzyme: 20 U/L (ref 9–67)

## 2020-03-13 LAB — CYTOMEGALOVIRUS ANTIBODY, IGG: Cytomegalovirus Ab-IgG: >10 U/mL — ABNORMAL HIGH

## 2020-03-13 LAB — FLUORESCENT TREPONEMAL AB(FTA)-IGG-BLD: Fluorescent Treponemal ABS: NONREACTIVE

## 2020-03-13 LAB — HSV(HERPES SIMPLEX VRS) I + II AB-IGG
HAV 1 IGG,TYPE SPECIFIC AB: <0.9 index
HSV 2 IGG,TYPE SPECIFIC AB: <0.9 index

## 2020-03-13 LAB — TIQ-NTM

## 2020-03-13 LAB — HIV ANTIBODY (ROUTINE TESTING W REFLEX): HIV 1&2 Ab, 4th Generation: NONREACTIVE

## 2020-03-13 LAB — ANA: Anti Nuclear Antibody (ANA): POSITIVE — AB

## 2020-03-13 LAB — CMV IGM: CMV IgM: <30 AU/mL

## 2020-03-13 LAB — RHEUMATOID FACTOR: Rheumatoid fact SerPl-aCnc: <14 IU/mL (ref <14)

## 2020-03-13 LAB — QUANTIFERON-TB GOLD PLUS,4 TUBES,DRAW SITE INCUBATED

## 2020-03-14 DIAGNOSIS — H5340 Unspecified visual field defects: Secondary | ICD-10-CM | POA: Diagnosis not present

## 2020-03-14 DIAGNOSIS — H4710 Unspecified papilledema: Secondary | ICD-10-CM | POA: Diagnosis not present

## 2020-03-14 LAB — CUP PACEART REMOTE DEVICE CHECK
Battery Remaining Longevity: 110 mo
Battery Remaining Percentage: 95.5 %
Battery Voltage: 2.99 V
Brady Statistic AP VP Percent: 10 %
Brady Statistic AP VS Percent: 21 %
Brady Statistic AS VP Percent: 63 %
Brady Statistic AS VS Percent: 4.8 %
Brady Statistic RA Percent Paced: 31 %
Brady Statistic RV Percent Paced: 74 %
Date Time Interrogation Session: 20210707225820
Implantable Lead Implant Date: 20171012
Implantable Lead Implant Date: 20171012
Implantable Lead Location: 753859
Implantable Lead Location: 753860
Implantable Pulse Generator Implant Date: 20171012
Lead Channel Impedance Value: 430 Ohm
Lead Channel Impedance Value: 580 Ohm
Lead Channel Pacing Threshold Amplitude: 0.5 V
Lead Channel Pacing Threshold Amplitude: 0.5 V
Lead Channel Pacing Threshold Pulse Width: 0.5 ms
Lead Channel Pacing Threshold Pulse Width: 0.5 ms
Lead Channel Sensing Intrinsic Amplitude: 2.5 mV
Lead Channel Sensing Intrinsic Amplitude: 5.9 mV
Lead Channel Setting Pacing Amplitude: 2 V
Lead Channel Setting Pacing Amplitude: 2.5 V
Lead Channel Setting Pacing Pulse Width: 0.5 ms
Lead Channel Setting Sensing Sensitivity: 2 mV
Pulse Gen Model: 2272
Pulse Gen Serial Number: 7955201

## 2020-03-15 NOTE — Progress Notes (Signed)
Remote pacemaker transmission.   

## 2020-03-25 ENCOUNTER — Encounter: Payer: Self-pay | Admitting: Family Medicine

## 2020-03-25 NOTE — Telephone Encounter (Signed)
I have already referred her to [facility]

## 2020-03-27 ENCOUNTER — Ambulatory Visit: Payer: Self-pay

## 2020-03-27 NOTE — Chronic Care Management (AMB) (Signed)
Reviewed chart for medication changes ahead of medication coordination call.  No OVs, Consults, or hospital visits since last care coordination call  No medication changes indicated  BP Readings from Last 3 Encounters:  03/06/20 126/62  02/15/20 136/70  08/21/19 106/67    Lab Results  Component Value Date   HGBA1C 5.8 11/28/2009     Patient obtains medications through Vials  90 Days (with easy open cap)    Last adherence delivery included:  Omeprazole 40mg  daily  Montelukast 10mg  daily  Amlodipine 5mg  daily  Metoprolol Tartrate 50 mg one twice daily  Np Thyroid 60mg  daily   Patient declined last month: Symbicort 31mcg/ 4.34mcg inhaler (last filled 12/15/19 for 90DS) Albuterol HFA inhaler (uses as needed) Ipratropium/albuterl nebulized solution (uses as needed)   Patient is due for next adherence delivery on: 03/27/2020   Called patient and reviewed medications and coordinated delivery.  This delivery to include: Metoprolol Succinate 100mg  daily  Np Thyroid 60mg  daily  Omeprazole 40mg  daily  Montelukast 10mg  daily  Amlodipine 5mg     No refills needed.   Confirmed delivery date of 03/27/2020, advised patient that pharmacy will contact them the morning of delivery.  4m, PharmD Clinical Pharmacist [facility] [place] [phone]

## 2020-04-02 ENCOUNTER — Other Ambulatory Visit: Payer: Self-pay

## 2020-04-08 ENCOUNTER — Encounter: Payer: Self-pay | Admitting: Family Medicine

## 2020-04-08 NOTE — Telephone Encounter (Signed)
Sent to [employer] for assistance.

## 2020-05-23 ENCOUNTER — Telehealth: Payer: Self-pay | Admitting: Pharmacist

## 2020-05-23 NOTE — Progress Notes (Signed)
I  3 attempt to reach patient for a reminder call for 05/24/20 at 9:00 am. Unable to leave a message.

## 2020-05-24 ENCOUNTER — Ambulatory Visit: Payer: PPO

## 2020-05-24 DIAGNOSIS — I10 Essential (primary) hypertension: Secondary | ICD-10-CM

## 2020-05-24 DIAGNOSIS — E785 Hyperlipidemia, unspecified: Secondary | ICD-10-CM

## 2020-05-24 NOTE — Patient Instructions (Signed)
Visit Information  Hi [name]!  It was great getting to meet you over the phone today. Keep up the good work on taking care of yourself! Continue checking your blood pressure, slowly adding in exercise, and some diet changes in order to keep your heart healthy. As always, feel free to reach out to me if you need anything!  Best, Maddie  Goals Addressed            This Visit's Progress   . Pharmacy Care Plan       CARE PLAN ENTRY  Current Barriers:  . Chronic Disease Management support, education, and care coordination needs related to HTN, HLD, and Asthma, GERD, Hypothyroidism, Osteopenia  Pharmacist Clinical Goal(s):  Marland Kitchen Asthma . Prevent worsening of shortness of breath and hospitalizations. . High blood pressure:  Marland Kitchen Maintain Blood pressure <130/80 mmHg. Marland Kitchen Recent reported home blood pressure: 101-117/ 60-68 mmHg. . High cholesterol:  . Cholesterol goals: Total Cholesterol goal under 200, Triglycerides goal under 150, HDL goal above 40 (men) or above 50 (women), LDL goal under 100.  . Current cholesterol levels (03/06/2020): - Total cholesterol: 250 - Triglycerides: 172 - HDL: 63 - LDL: 152 . Hypothyroidism:  . Maintain TSH between 0.45 to 4.5uIU/ml. . Current TSH: 2.71 (10/21/2017) . Heart burn: Minimize reflux symptoms.   Interventions: . Comprehensive medication review performed. . Collaboration with provider re: medication management . Blood pressure:  . Discussed need to continue checking blood pressure at home.  . Discussed diet modifications. DASH diet:  following a diet emphasizing fruits and vegetables and low-fat dairy products along with whole grains, fish, poultry, and nuts. Reducing red meats and sugars.  . Exercising- see exercising section below  . Reducing the amount of salt intake to 1500mg /per day.  . Recommend using a salt substitute to replace your salt if you need flavor.    . Weight reduction- We discussed losing 5-10% of body weight . Continue:  amlodipine 5mg ,1 tablet daily and metoprolol succinate 100mg , 1 tablet daily.  . High cholesterol . How to reduce cholesterol through diet/weight management and physical activity.    . We discussed how a diet high in plant sterols (fruits/vegetables/nuts/whole grains/legumes) may reduce your cholesterol.  Encouraged increasing fiber to a daily intake of 10-25g/day  . Continue: diet modification.  . Recommend researching other cholesterol medications (such as Zetia) to reduce the risk of heart attacks and strokes. . Asthma . Continue:  - Ipratropium/ albuterol 0.5/2.5mg / 60ml, 1 vial via nebulizer every four hours as needed - montelukast (Singulair) 10mg , 1 tablet daily - budesonide/ formoterol (Symbicort 80-4.47mcg/ act inhaler), inhaler 2 puffs twice daily - albuterol (Proair HFA), 2 puffs every four hours as needed. . Discussed rinsing mouth after each use of budesonide/formoterol (Symbicort)  . Hypothyroidism . Recommend annual thyroid blood work.  . Discussed taking thyroid medication consistent in regards to food and other medications.  . Continue: NP thyroid 60mg , 1 tablet daily  . GERD: . Discussed non-pharmacological interventions for acid reflux. Take measures to prevent acid reflux, such as avoiding spicy foods, avoiding caffeine, avoid laying down a few hours after eating, and raising the head of the bed. . Continue: omeprazole 40mg , 1 capsule daily and sodium bicarbonate, 1 tablet daily.  . Bone health . Continue:  calcium carbonate/ vitamin D, 1200mg , 1 tablet daily and cholecalciferol 1,000 units daily  . Recommend obtaining vitamin level blood check.  Patient Self Care Activities:  . Self administers medications as prescribed and Calls provider office for new  concerns or questions . Continue current medications as directed by providers.  . Continue following up with your primary care provider and specialists. . Continue at home blood pressure readings.  . Continue working on  health habits (diet/ exercise).  Please see past updates related to this goal by clicking on the "Past Updates" button in the selected goal         The patient verbalized understanding of instructions provided today and declined a print copy of patient instruction materials.   Telephone follow up appointment with pharmacy team member scheduled for: 6 months  Gaylord Shih, PharmD Clinical Pharmacist [facility] [place] [phone]

## 2020-05-24 NOTE — Chronic Care Management (AMB) (Signed)
Chronic Care Management Pharmacy  Name: Whitney Evans  MRN: [id number]  DOB: 20-Aug-1946  Initial Questions: 1. Have you seen any other providers since your last visit? NA 2. Any changes in your medicines or health? No   Chief Complaint/ HPI  Whitney Evans,  74 y.o. , female presents for their Follow-Up CCM visit with the clinical pharmacist via telephone due to COVID-19 Pandemic.  PCP : Nelwyn Salisbury, MD  Their chronic conditions include: HTN, asthma, GERD, hypothyroidism, osteopenia, HLD  Office Visits: 03/06/20 Dr. Clent Ridges - Patient presented for chronic care office visit. BP has been good in the mornings but increases by around 7pm every day. Metoprolol increased to 100 mg in the morning.  09/04/2019- Patient presented for virtual visit with Dr. Gershon Crane, MD for HTN. Patient's HTN has been stable. BP: 112/67. Pulse: 68. Patient advised not to go to gym to reduce exposure to COVID. Suggested patient to use elliptical machine for home use.   08/21/2019- Patient presented for virtual visit with Henrene Dodge, LPN for annual wellness visit. Patient uses [facility] for lifestyle modifications. Patient agreed to complete cologuard, mammogram, and DEXA scan.   Consult Visit: 03/14/20 - Suzzette Righter (neuro ophthalmology) - Patient presented to discuss abnormal findings from CT on 02/14/20. Follow up in 6 months.  02/15/2020- Cardiologist- Patient presented to Dr. Loman Brooklyn, MD for AV block. Pt denies symptoms of palpitations, chest pain, shortness of breath, orthopnea, etc. Vitamin D supplementation decreased to 1,000 units daily.  02/01/20 - Mia Creek (ophthalmology) - Patient presented for follow up for cataracts. Unable to access notes.  Medications: Outpatient Encounter Medications as of 05/24/2020  Medication Sig  . albuterol (PROAIR HFA) 108 (90 Base) MCG/ACT inhaler TAKE 2 PUFFS BY MOUTH EVERY 4 HOURS AS NEEDED  . amLODipine (NORVASC) 5 MG tablet Take 1 tablet (5 mg total) by  mouth daily.  Marland Kitchen BLACK PEPPER-TURMERIC PO Take by mouth.  . budesonide-formoterol (SYMBICORT) 80-4.5 MCG/ACT inhaler Inhale 2 puffs into the lungs 2 (two) times daily.  . Calcium Carbonate-Vit D-Min (CALCIUM 1200 PO) Take 1,200 mg by mouth daily.  . Cholecalciferol 25 MCG (1000 UT) tablet Take 1,000 Units by mouth daily.  . Coenzyme Q10 (CO Q 10 PO) Take 100 mg by mouth daily.   Marland Kitchen ipratropium-albuterol (DUONEB) 0.5-2.5 (3) MG/3ML SOLN Take 3 mLs by nebulization every 4 (four) hours as needed (asthma).  Marland Kitchen MAGNESIUM PO Take 450 mg by mouth at bedtime.   . metoprolol succinate (TOPROL-XL) 100 MG 24 hr tablet Take 1 tablet (100 mg total) by mouth daily. Take with or immediately following a meal.  . montelukast (SINGULAIR) 10 MG tablet Take 1 tablet (10 mg total) by mouth daily.  . Multiple Vitamin (MULTIVITAMIN WITH MINERALS) TABS tablet Take 1 tablet by mouth daily.  . NP THYROID 60 MG tablet Take 1 tablet (60 mg total) by mouth daily.  . Omega-3 Fatty Acids (FISH OIL PO) Take 1,300 mg by mouth daily.   Marland Kitchen omeprazole (PRILOSEC) 40 MG capsule TAKE 1 CAPSULE BY MOUTH EVERY DAY   No facility-administered encounter medications on file as of 05/24/2020.     Current Diagnosis/Assessment:  Goals Addressed            This Visit's Progress   . Pharmacy Care Plan       CARE PLAN ENTRY  Current Barriers:  . Chronic Disease Management support, education, and care coordination needs related to HTN, HLD, and Asthma, GERD, Hypothyroidism, Osteopenia  Pharmacist Clinical  Goal(s):  Marland Kitchen Asthma . Prevent worsening of shortness of breath and hospitalizations. . High blood pressure:  Marland Kitchen Maintain Blood pressure <130/80 mmHg. Marland Kitchen Recent reported home blood pressure: 101-117/ 60-68 mmHg. . High cholesterol:  . Cholesterol goals: Total Cholesterol goal under 200, Triglycerides goal under 150, HDL goal above 40 (men) or above 50 (women), LDL goal under 100.  . Current cholesterol levels (03/06/2020): - Total  cholesterol: 250 - Triglycerides: 172 - HDL: 63 - LDL: 152 . Hypothyroidism:  . Maintain TSH between 0.45 to 4.5uIU/ml. . Current TSH: 2.71 (10/21/2017) . Heart burn: Minimize reflux symptoms.   Interventions: . Comprehensive medication review performed. . Collaboration with provider re: medication management . Blood pressure:  . Discussed need to continue checking blood pressure at home.  . Discussed diet modifications. DASH diet:  following a diet emphasizing fruits and vegetables and low-fat dairy products along with whole grains, fish, poultry, and nuts. Reducing red meats and sugars.  . Exercising- see exercising section below  . Reducing the amount of salt intake to 1500mg /per day.  . Recommend using a salt substitute to replace your salt if you need flavor.    . Weight reduction- We discussed losing 5-10% of body weight . Continue: amlodipine 5mg ,1 tablet daily and metoprolol succinate 100mg , 1 tablet daily.  . High cholesterol . How to reduce cholesterol through diet/weight management and physical activity.    . We discussed how a diet high in plant sterols (fruits/vegetables/nuts/whole grains/legumes) may reduce your cholesterol.  Encouraged increasing fiber to a daily intake of 10-25g/day  . Continue: diet modification.  . Recommend researching other cholesterol medications (such as Zetia) to reduce the risk of heart attacks and strokes. . Asthma . Continue:  - Ipratropium/ albuterol 0.5/2.5mg / 84ml, 1 vial via nebulizer every four hours as needed - montelukast (Singulair) 10mg , 1 tablet daily - budesonide/ formoterol (Symbicort 80-4.5mcg/ act inhaler), inhaler 2 puffs twice daily - albuterol (Proair HFA), 2 puffs every four hours as needed. . Discussed rinsing mouth after each use of budesonide/formoterol (Symbicort)  . Hypothyroidism . Recommend annual thyroid blood work.  . Discussed taking thyroid medication consistent in regards to food and other medications.   . Continue: NP thyroid 60mg , 1 tablet daily  . GERD: . Discussed non-pharmacological interventions for acid reflux. Take measures to prevent acid reflux, such as avoiding spicy foods, avoiding caffeine, avoid laying down a few hours after eating, and raising the head of the bed. . Continue: omeprazole 40mg , 1 capsule daily and sodium bicarbonate, 1 tablet daily.  . Bone health . Continue:  calcium carbonate/ vitamin D, 1200mg , 1 tablet daily and cholecalciferol 1,000 units daily  . Recommend obtaining vitamin level blood check.  Patient Self Care Activities:  . Self administers medications as prescribed and Calls provider office for new concerns or questions . Continue current medications as directed by providers.  . Continue following up with your primary care provider and specialists. . Continue at home blood pressure readings.  . Continue working on health habits (diet/ exercise).  Please see past updates related to this goal by clicking on the "Past Updates" button in the selected goal        SDOH Interventions     Most Recent Value  SDOH Interventions  Financial Strain Interventions Intervention Not Indicated  Transportation Interventions Intervention Not Indicated      Asthma   Eosinophil count:   Lab Results  Component Value Date/Time   EOSPCT 1.0 03/06/2020 09:59 AM  %  Eos (Absolute):  Lab Results  Component Value Date/Time   EOSABS 0.1 03/06/2020 09:59 AM   Tobacco Status:  Social History   Tobacco Use  Smoking Status Former Smoker  Smokeless Tobacco Never Used   Patient has failed these meds in past: Qvar Patient is currently controlled on the following medications:  -  Ipratropium/ albuterol 0.5/2.5mg / 47ml, 1 vial via nebulizer every four hours as needed - montelukast 10mg , 1 tablet daily  - budesonide/ formoterol (Symbicort 80-4.40mcg/ act inhaler), inhaler 2 puffs twice daily - albuterol (Proair HFA), 2 puffs every four  hours as needed   Using maintenance inhaler regularly? Yes Frequency of rescue inhaler use:  1-2x per week  We discussed:  proper inhaler technique  Plan Continue current medications.   Hypertension  Denies dizziness/lightheadedness. Pt endorses headaches but unrelated to high blood pressure.  BP goal is < 130/80  Office blood pressures are  BP Readings from Last 3 Encounters:  03/06/20 126/62  02/15/20 136/70  08/21/19 106/67   Patient has failed these meds in the past: lisinopril, losartan  Patient checks BP at home twice daily.   Patient home BP readings are ranging: 110-125/65 mmHg (lower Bps 2-3 x per month and denies SBP > 140)  Patient is controlled on:  - amlodipine 5mg ,1 tablet daily - metoprolol succinate 100mg , 1 tablet daily  We discussed diet and exercise extensively . Exercising: walk around yard and house . Reducing the amount of salt intake to 1500mg /per day . Patient reports cooking at home all the time and making sure to add salt in moderation to food  Plan  Continue current medications.   Continue monitoring and recording BP at home and recording  Hyperlipidemia   LDL < 100  Lipid Panel     Component Value Date/Time   CHOL 250 (H) 03/06/2020 0959   TRIG 172.0 (H) 03/06/2020 0959   HDL 63.10 03/06/2020 0959   CHOLHDL 4 03/06/2020 0959   VLDL 34.4 03/06/2020 0959   LDLCALC 152 (H) 03/06/2020 0959   LDLDIRECT 173.6 12/03/2011 0948    The 10-year ASCVD risk score 03/08/2020 DC Jr., et al., 2013) is: 15.5%   Values used to calculate the score:     Age: 45 years     Sex: Female     Is Non-Hispanic African American: No     Diabetic: No     Tobacco smoker: No     Systolic Blood Pressure: 126 mmHg     Is BP treated: Yes     HDL Cholesterol: 63.1 mg/dL     Total Cholesterol: 250 mg/dL  Patient has failed these meds in past: atorvastatin  Patient is currently uncontrolled on the following medications:  - omega-3 fatty acids 1 capsule daily    We discussed:  diet and exercise extensively and the importance of cholesterol medications . Patient refuses to try statin medications due to possible myalgias and already having muscle pains . Discussed other medications available for cholesterol lowering such as Zetia . Patient has been focusing on losing weight. Patient recently bought an elliptical and has been using about 3 times a week   Plan Strongly encouraged patient patient to think about other cholesterol medications for additional LDL lowering. Plan to readdress medicines at follow up.  Continue control with diet and exercise.   Hypothyroidism   TSH  Date Value Ref Range Status  03/06/2020 1.84 0.35 - 4.50 uIU/mL Final    Patient has failed these meds in past: none  Patient  is currently controlled on the following medications:  - NP thyroid 60mg , 1 tablet daily (first thing with rest of pills).   Plan Continue current medications  GERD  Patient has failed these meds in past: Nexium, Tums  Patient is currently controlled on the following medications:  - Omeprazole 40mg , 1 capsule daily  - Sodium bicarbonate, 1 tablet daily (with morning coffee)   We discussed:  non-pharmacological interventions for acid reflux. Take measures to prevent acid reflux, such as avoiding spicy foods, avoiding caffeine, avoid laying down a few hours after eating, and raising the head of the bed - Patient drinks decaffeinated coffee now  Plan Patient denies breakthrough heartburn or having to use Tums at all recently.  Continue current medications  Osteopenia   Patient has failed these meds in past: none  Patient is currently controlled on the following medications:  - calcium carbonate/ vitamin D, 1200mg , 1 tablet daily - cholecalciferol 1,000 units daily   Plan Recommend vitamin D level.  Continue current medications.   OTC/Supplements   Patient is currently on the following medications:  . Coenzyme Q10 100 mg  daily . Magnesium 450 mg 1 tablet at bedtime . Multivitamin 1 tablet daily   Plan  Continue current medications    Medication Management   Pt uses [facility] for all medications Uses pill box? Yes   Plan  Utilize [facility] pharmacy for medication synchronization, packaging and delivery   Follow up: 6 month phone visit   Gaylord ShihMadeline Sweet Jarvis, PharmD Clinical Pharmacist [facility] [place] [phone]

## 2020-06-06 NOTE — Chronic Care Management (AMB) (Signed)
Reviewed chart for medication changes ahead of medication coordination call.  No OVs, Consults, or hospital visits since last care coordination call/Pharmacist visit.  No medication changes indicated.  BP Readings from Last 3 Encounters:  03/06/20 126/62  02/15/20 136/70  08/21/19 106/67    Lab Results  Component Value Date   HGBA1C 5.8 11/28/2009     Patient obtains medications through Vials  90 Days   Last adherence delivery included:  Omeprazole 40mg  daily  Montelukast 10mg  daily  Amlodipine 5mg  daily  Metoprolol Tartrate 50 mg one twice daily  Np Thyroid 60mg  daily  Patient declined last month: Symbicort 9mcg/ 4.82mcg inhaler (last filled 12/15/19 for 90DS) Albuterol HFA inhaler (uses as needed) Ipratropium/albuterl nebulized solution (uses as needed)  Patient is due for next adherence delivery on: 06/28/20.  This delivery to include: No medications are needed at this time.

## 2020-06-12 ENCOUNTER — Ambulatory Visit (INDEPENDENT_AMBULATORY_CARE_PROVIDER_SITE_OTHER): Payer: PPO

## 2020-06-12 DIAGNOSIS — I442 Atrioventricular block, complete: Secondary | ICD-10-CM | POA: Diagnosis not present

## 2020-06-13 LAB — CUP PACEART REMOTE DEVICE CHECK
Battery Remaining Longevity: 110 mo
Battery Remaining Percentage: 95.5 %
Battery Voltage: 2.99 V
Brady Statistic AP VP Percent: 16 %
Brady Statistic AP VS Percent: 16 %
Brady Statistic AS VP Percent: 64 %
Brady Statistic AS VS Percent: 3.8 %
Brady Statistic RA Percent Paced: 32 %
Brady Statistic RV Percent Paced: 80 %
Date Time Interrogation Session: 20211006233823
Implantable Lead Implant Date: 20171012
Implantable Lead Implant Date: 20171012
Implantable Lead Location: 753859
Implantable Lead Location: 753860
Implantable Pulse Generator Implant Date: 20171012
Lead Channel Impedance Value: 440 Ohm
Lead Channel Impedance Value: 580 Ohm
Lead Channel Pacing Threshold Amplitude: 0.5 V
Lead Channel Pacing Threshold Amplitude: 0.5 V
Lead Channel Pacing Threshold Pulse Width: 0.5 ms
Lead Channel Pacing Threshold Pulse Width: 0.5 ms
Lead Channel Sensing Intrinsic Amplitude: 2.1 mV
Lead Channel Sensing Intrinsic Amplitude: 5.9 mV
Lead Channel Setting Pacing Amplitude: 2 V
Lead Channel Setting Pacing Amplitude: 2.5 V
Lead Channel Setting Pacing Pulse Width: 0.5 ms
Lead Channel Setting Sensing Sensitivity: 2 mV
Pulse Gen Model: 2272
Pulse Gen Serial Number: 7955201

## 2020-06-17 NOTE — Progress Notes (Signed)
Remote pacemaker transmission.   

## 2020-06-27 ENCOUNTER — Telehealth: Payer: Self-pay | Admitting: Pharmacist

## 2020-06-27 DIAGNOSIS — I10 Essential (primary) hypertension: Secondary | ICD-10-CM

## 2020-06-27 DIAGNOSIS — E039 Hypothyroidism, unspecified: Secondary | ICD-10-CM

## 2020-06-27 NOTE — Progress Notes (Addendum)
Chronic Care Management Pharmacy Assistant   Name: MYKENZI VANZILE  MRN: [id number] DOB: 14-Nov-1945  Reason for Encounter: Medication Review  Patient Questions:  1.  Have you seen any other providers since your last visit? No  2.  Any changes in your medicines or health? No   PCP : Nelwyn Salisbury, MD  Allergies:   Allergies  Allergen Reactions  . Morphine Sulfate Hives and Shortness Of Breath  . Pneumococcal Vaccine Polyvalent Shortness Of Breath and Swelling    Skin peeled off    Medications: Outpatient Encounter Medications as of 06/27/2020  Medication Sig  . albuterol (PROAIR HFA) 108 (90 Base) MCG/ACT inhaler TAKE 2 PUFFS BY MOUTH EVERY 4 HOURS AS NEEDED  . amLODipine (NORVASC) 5 MG tablet Take 1 tablet (5 mg total) by mouth daily.  Marland Kitchen BLACK PEPPER-TURMERIC PO Take by mouth.  . budesonide-formoterol (SYMBICORT) 80-4.5 MCG/ACT inhaler Inhale 2 puffs into the lungs 2 (two) times daily.  . Calcium Carbonate-Vit D-Min (CALCIUM 1200 PO) Take 1,200 mg by mouth daily.  . Cholecalciferol 25 MCG (1000 UT) tablet Take 1,000 Units by mouth daily.  . Coenzyme Q10 (CO Q 10 PO) Take 100 mg by mouth daily.   Marland Kitchen ipratropium-albuterol (DUONEB) 0.5-2.5 (3) MG/3ML SOLN Take 3 mLs by nebulization every 4 (four) hours as needed (asthma).  Marland Kitchen MAGNESIUM PO Take 450 mg by mouth at bedtime.   . metoprolol succinate (TOPROL-XL) 100 MG 24 hr tablet Take 1 tablet (100 mg total) by mouth daily. Take with or immediately following a meal.  . montelukast (SINGULAIR) 10 MG tablet Take 1 tablet (10 mg total) by mouth daily.  . Multiple Vitamin (MULTIVITAMIN WITH MINERALS) TABS tablet Take 1 tablet by mouth daily.  . NP THYROID 60 MG tablet Take 1 tablet (60 mg total) by mouth daily.  . Omega-3 Fatty Acids (FISH OIL PO) Take 1,300 mg by mouth daily.   Marland Kitchen omeprazole (PRILOSEC) 40 MG capsule TAKE 1 CAPSULE BY MOUTH EVERY DAY   No facility-administered encounter medications on file as of 06/27/2020.     Current Diagnosis: Patient Active Problem List   Diagnosis Date Noted  . Trigger finger, left little finger 10/21/2017  . Osteopenia 12/29/2016  . Complete heart block (HCC) 06/17/2016  . Insomnia 07/03/2014  . Dyslipidemia 01/19/2013  . PHLEBITIS, LOWER EXTREMITY 01/16/2008  . Hypothyroidism 06/03/2007  . HTN (hypertension) 06/03/2007  . NECK PAIN, CHRONIC 06/03/2007  . History of other specified conditions presenting hazards to health 06/03/2007  . ALLERGIC RHINITIS 05/05/2007  . Asthma 05/05/2007  . GERD 05/05/2007    Goals Addressed   None     Follow-Up:  Coordination of Enhanced Pharmacy Services    Reviewed chart for medication changes ahead of medication coordination call.  No OVs, Consults, or hospital visits since last care coordination call/Pharmacist visit.No medication changes indicated  BP Readings from Last 3 Encounters:  03/06/20 126/62  02/15/20 136/70  08/21/19 106/67    Lab Results  Component Value Date   HGBA1C 5.8 11/28/2009     Patient obtains medications through Vials  90 Days   Last adherence delivery included:   Budes /formot Aerosol 80-4.5  Np Thyroid 60mg  daily Metoprolol Succinate ER 100mg  daily  Omeprazole 40mg  daily Montelukast 10mg  daily  Amlodipine 5mg  daily   Patient is due for next adherence delivery on: 06/28/2020 Called patient and reviewed medications and coordinated delivery.  This delivery to include:  Budes /formot Aerosol 80-4.5  Np Thyroid 60mg  daily  Metoprolol Succinate ER 100mg  daily  Omeprazole 40mg  daily Montelukast 10mg  daily  Amlodipine 5mg  daily   Confirmed delivery date of 06/28/2020, advised patient that pharmacy will contact them the morning of delivery.   , [facility]  Practice Team Manager/ CPA (Clinical Pharmacist Assistant) [phone]

## 2020-07-16 ENCOUNTER — Telehealth: Payer: Self-pay | Admitting: Pharmacist

## 2020-07-16 NOTE — Chronic Care Management (AMB) (Signed)
Chronic Care Management Pharmacy Assistant   Name: JAILEN LUNG  MRN: [id number] DOB: [name] 11, 1946  Reason for Encounter: Medication Review  Patient Questions:  1.  Have you seen any other providers since your last visit? No  2.  Any changes in your medicines or health? No   PCP : Nelwyn Salisbury, MD  Allergies:   Allergies  Allergen Reactions  . Morphine Sulfate Hives and Shortness Of Breath  . Pneumococcal Vaccine Polyvalent Shortness Of Breath and Swelling    Skin peeled off    Medications: Outpatient Encounter Medications as of 07/16/2020  Medication Sig  . albuterol (PROAIR HFA) 108 (90 Base) MCG/ACT inhaler TAKE 2 PUFFS BY MOUTH EVERY 4 HOURS AS NEEDED  . amLODipine (NORVASC) 5 MG tablet Take 1 tablet (5 mg total) by mouth daily.  Marland Kitchen BLACK PEPPER-TURMERIC PO Take by mouth.  . budesonide-formoterol (SYMBICORT) 80-4.5 MCG/ACT inhaler Inhale 2 puffs into the lungs 2 (two) times daily.  . Calcium Carbonate-Vit D-Min (CALCIUM 1200 PO) Take 1,200 mg by mouth daily.  . Cholecalciferol 25 MCG (1000 UT) tablet Take 1,000 Units by mouth daily.  . Coenzyme Q10 (CO Q 10 PO) Take 100 mg by mouth daily.   Marland Kitchen ipratropium-albuterol (DUONEB) 0.5-2.5 (3) MG/3ML SOLN Take 3 mLs by nebulization every 4 (four) hours as needed (asthma).  Marland Kitchen MAGNESIUM PO Take 450 mg by mouth at bedtime.   . metoprolol succinate (TOPROL-XL) 100 MG 24 hr tablet Take 1 tablet (100 mg total) by mouth daily. Take with or immediately following a meal.  . montelukast (SINGULAIR) 10 MG tablet Take 1 tablet (10 mg total) by mouth daily.  . Multiple Vitamin (MULTIVITAMIN WITH MINERALS) TABS tablet Take 1 tablet by mouth daily.  . NP THYROID 60 MG tablet Take 1 tablet (60 mg total) by mouth daily.  . Omega-3 Fatty Acids (FISH OIL PO) Take 1,300 mg by mouth daily.   Marland Kitchen omeprazole (PRILOSEC) 40 MG capsule TAKE 1 CAPSULE BY MOUTH EVERY DAY   No facility-administered encounter medications on file as of 07/16/2020.     Current Diagnosis: Patient Active Problem List   Diagnosis Date Noted  . Trigger finger, left little finger 10/21/2017  . Osteopenia 12/29/2016  . Complete heart block (HCC) 06/17/2016  . Insomnia 07/03/2014  . Dyslipidemia 01/19/2013  . PHLEBITIS, LOWER EXTREMITY 01/16/2008  . Hypothyroidism 06/03/2007  . HTN (hypertension) 06/03/2007  . NECK PAIN, CHRONIC 06/03/2007  . History of other specified conditions presenting hazards to health 06/03/2007  . ALLERGIC RHINITIS 05/05/2007  . Asthma 05/05/2007  . GERD 05/05/2007    Goals Addressed   None    Reviewed chart for medication changes ahead of medication coordination call. No OVs, Consults, or hospital visits since last care coordination call/Pharmacist visit. No medication changes indicated OR if recent visit, treatment plan here.  BP Readings from Last 3 Encounters:  03/06/20 126/62  02/15/20 136/70  08/21/19 106/67    Lab Results  Component Value Date   HGBA1C 5.8 11/28/2009     Patient obtains medications through Vials  90 Days  Last adherence delivery included:  . Budes /formot Aerosol 80-4.5  . Np Thyroid 60 mg daily . Metoprolol Succinate ER 100 mg daily  . Omeprazole 40 mg daily . Montelukast 10 mg daily  . Amlodipine 5 mg daily  I spoke with the patient and review medications. There are no changes in medications currently. The patient is taking the following medications: . Budes /formot Aerosol 80-4.5  .  Np Thyroid 60 mg daily . Metoprolol Succinate ER 100 mg daily  . Omeprazole 40 mg daily . Montelukast 10 mg daily  . Amlodipine 5 mg daily  She does not need refills currently. Her next  delivery date of 09-15-2020, advised patient that pharmacy will contact them the morning of delivery.  Follow-Up:  Coordination of Enhanced Pharmacy Services and Pharmacist Review   Berenice Bouton, [facility] Clinical Pharmacy Assistant [phone]

## 2020-08-12 IMAGING — MR MR ORBITS WO/W CM
24 of 26 series · 42 of 48 positions shown · IV contrast (gadavist)
Comparison: None.

CLINICAL DATA: Optic nerve edema

EXAM:
MRI HEAD AND ORBITS WITHOUT AND WITH CONTRAST
TECHNIQUE: Multiplanar, multiecho pulse sequences of the brain and surrounding
structures were obtained without and with intravenous contrast.
Multiplanar, multiecho pulse sequences of the orbits and surrounding
structures were obtained including fat saturation techniques, before
and after intravenous contrast administration.
CONTRAST:  9mL GADAVIST GADOBUTROL 1 MMOL/ML IV SOLN

[Series 5: DWI · axial · 3.0mm · 0.88mm/px · z∈[-85,+54]mm · 6 of 96 slices shown (1 of 4)]
[im 1/96]
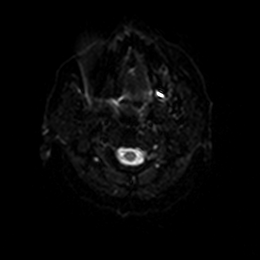
[im 20/96]
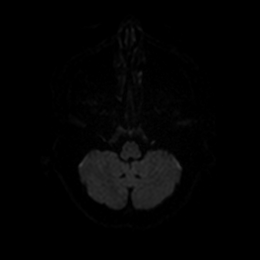
[im 39/96]
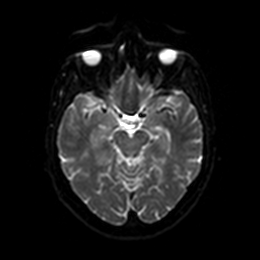
[im 58/96]
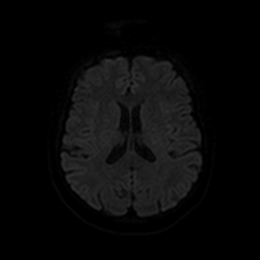
[im 77/96]
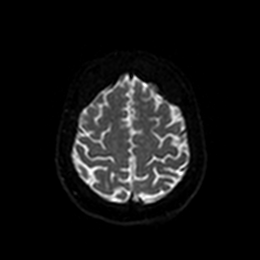
[im 96/96]
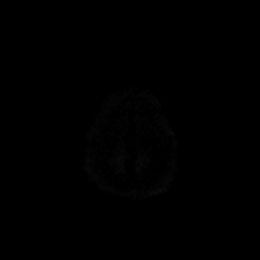

[Series 6: DWI · axial · 3.0mm · 0.88mm/px · z∈[-85,+54]mm · 3 of 48 slices shown (2 of 4)]
[im 1/48]
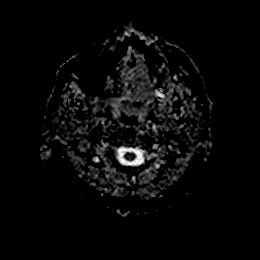
[im 24/48]
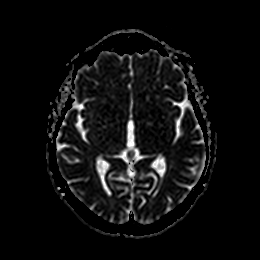
[im 48/48]
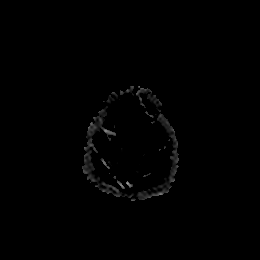

[Series 7: DWI · coronal · 4.0mm · 0.88mm/px · 3 of 64 slices shown (3 of 4)]
[im 1/64]
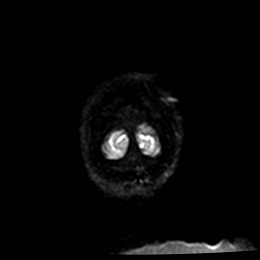
[im 32/64]
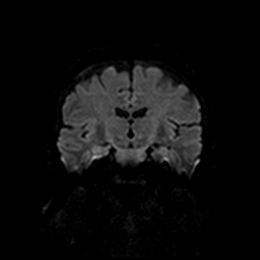
[im 64/64]
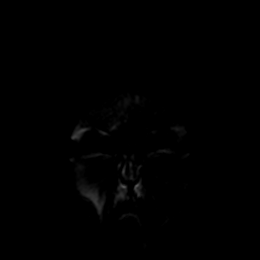

[Series 8: DWI · coronal · 4.0mm · 0.88mm/px · 2 of 32 slices shown (4 of 4)]
[im 1/32]
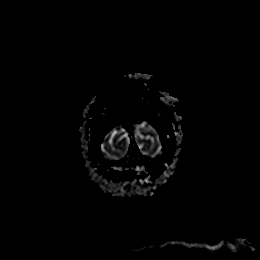
[im 32/32]
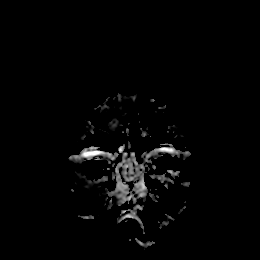

[Series 9: T1 · sagittal · 5.0mm · 0.75mm/px · 1 of 25 slices shown (1 of 3)]
[im 1/25]
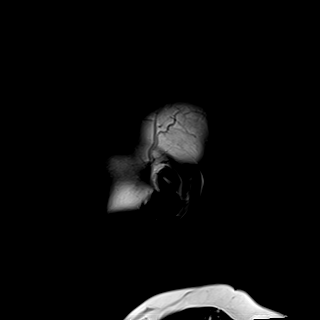

[Series 10: T2 · axial · 5.0mm · 0.72mm/px · 1 of 25 slices shown]
[im 1/25]
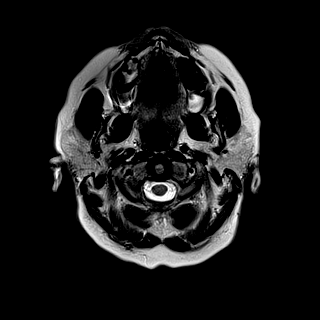

[Series 11: FLAIR · axial · 5.0mm · 0.45mm/px · 1 of 25 slices shown]
[im 1/25]
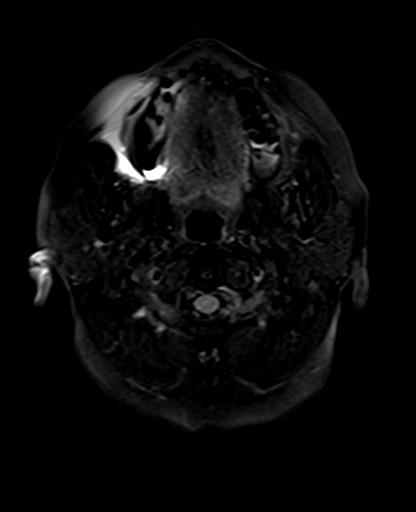

[Series 12: mag_images · axial · 3.0mm · 0.90mm/px · z∈[-99,+76]mm · 3 of 60 slices shown]
[im 1/60]
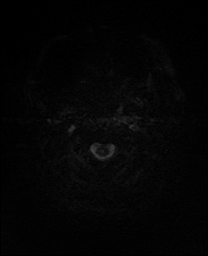
[im 30/60]
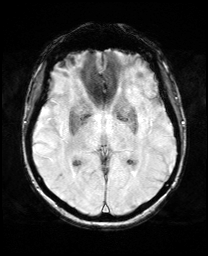
[im 60/60]
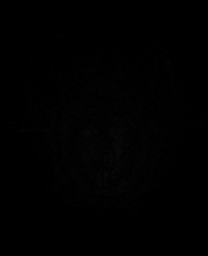

[Series 13: pha_images · axial · 3.0mm · 0.90mm/px · z∈[-99,+76]mm · 3 of 60 slices shown]
[im 1/60]
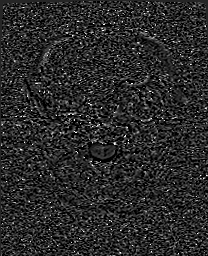
[im 30/60]
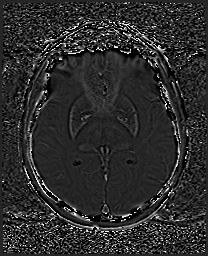
[im 60/60]
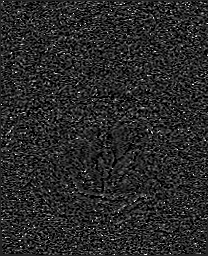

[Series 14: swi_images · axial · 3.0mm · 0.90mm/px · z∈[-99,+76]mm · 3 of 60 slices shown]
[im 1/60]
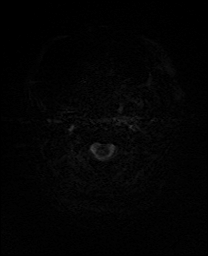
[im 30/60]
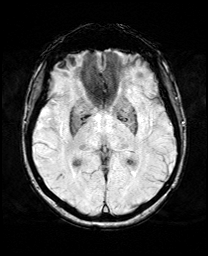
[im 60/60]
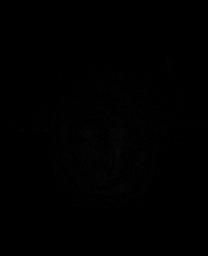

[Series 15: mip_images(sw) · axial · 24.0mm · 0.90mm/px · z∈[-88,+65]mm · 3 of 53 slices shown]
[im 1/53]
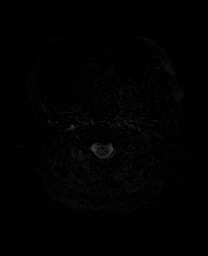
[im 27/53]
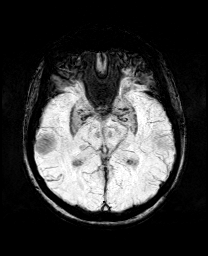
[im 53/53]
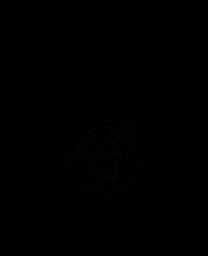

[Series 17: T1 · axial · non-contrast · 3.0mm · 0.33mm/px · 1 of 15 slices shown (2 of 3)]
[im 1/15]
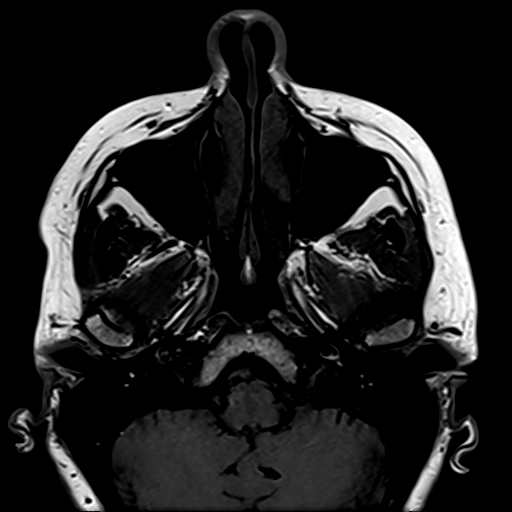

[Series 18: T2 fat-sat · axial · 3.0mm · 0.54mm/px · 1 of 15 slices shown (1 of 6)]
[im 1/15]
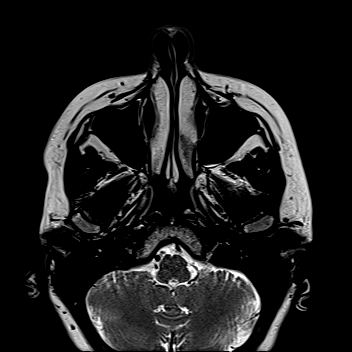

[Series 19: T2 fat-sat · axial · 3.0mm · 0.54mm/px · 1 of 15 slices shown (2 of 6)]
[im 1/15]
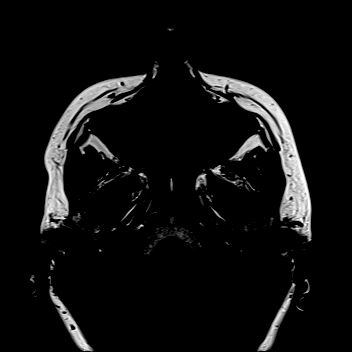

[Series 20: T2 fat-sat · axial · 3.0mm · 0.54mm/px · 1 of 15 slices shown (3 of 6)]
[im 1/15]
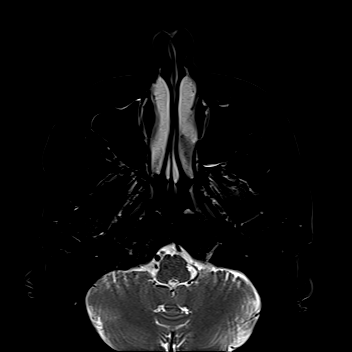

[Series 21: T2 fat-sat · coronal · 3.0mm · 0.54mm/px · 1 of 25 slices shown (4 of 6)]
[im 1/25]
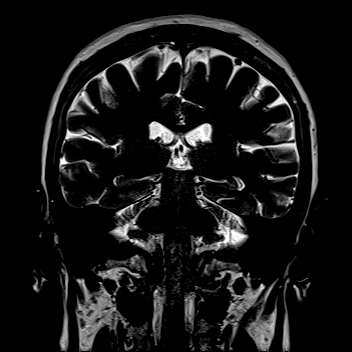

[Series 22: T2 fat-sat · coronal · 3.0mm · 0.54mm/px · 1 of 25 slices shown (5 of 6)]
[im 1/25]
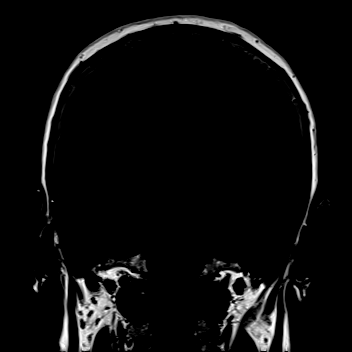

[Series 23: T2 fat-sat · coronal · 3.0mm · 0.54mm/px · 1 of 25 slices shown (6 of 6)]
[im 1/25]
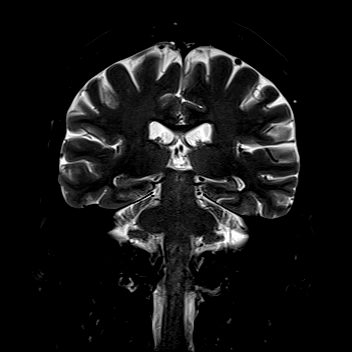

[Series 24: T1 · coronal · 3.0mm · 0.37mm/px · 1 of 25 slices shown (3 of 3)]
[im 1/25]
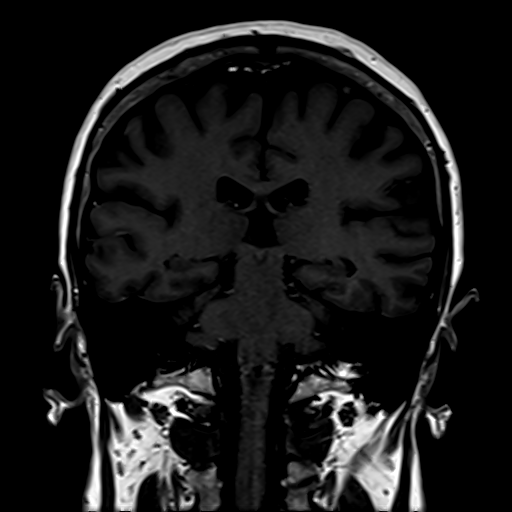

[Series 25: T2 post-contrast · coronal · 5.0mm · 0.72mm/px · 1 of 28 slices shown]
[im 1/28]
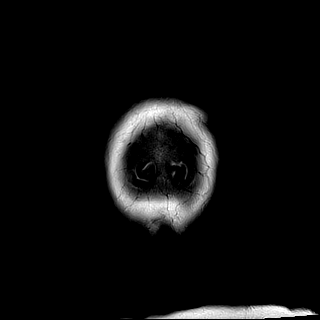

[Series 27: T1 post-contrast · coronal · 5.0mm · 0.34mm/px · 1 of 28 slices shown (1 of 2)]
[im 1/28]
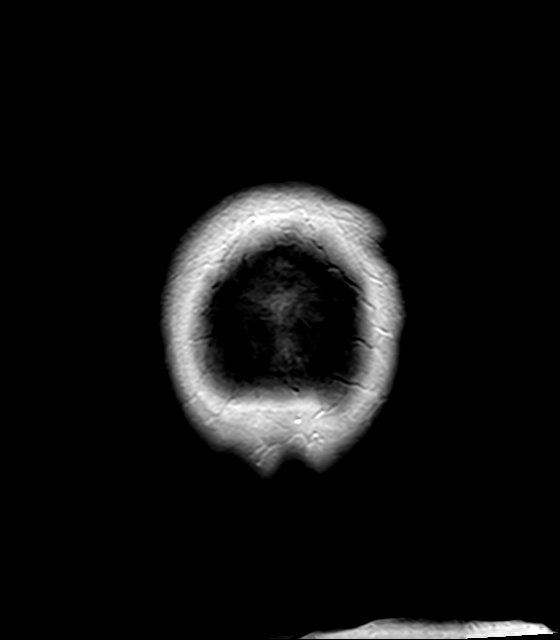

[Series 28: T1 post-contrast · sagittal · 5.0mm · 0.72mm/px · 1 of 25 slices shown (2 of 2)]
[im 1/25]
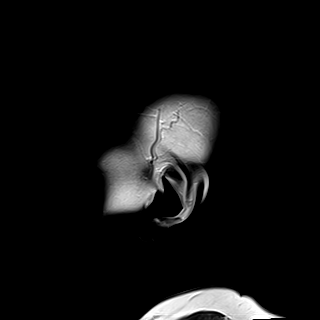

[Series 29: T1 fat-sat post-contrast · axial · 3.0mm · 0.33mm/px · 1 of 15 slices shown (1 of 2)]
[im 1/15]
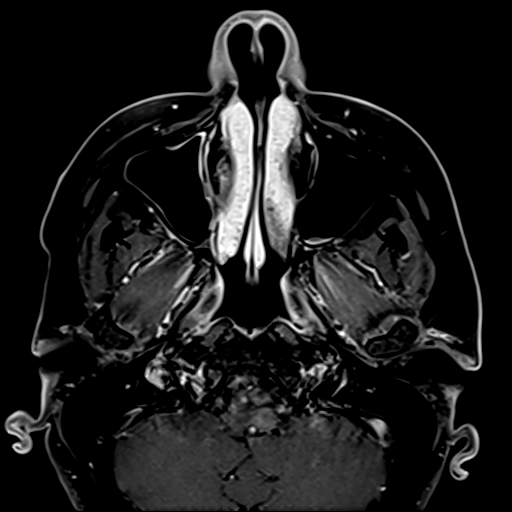

[Series 30: T1 fat-sat post-contrast · coronal · 3.0mm · 0.37mm/px · 1 of 25 slices shown (2 of 2)]
[im 1/25]
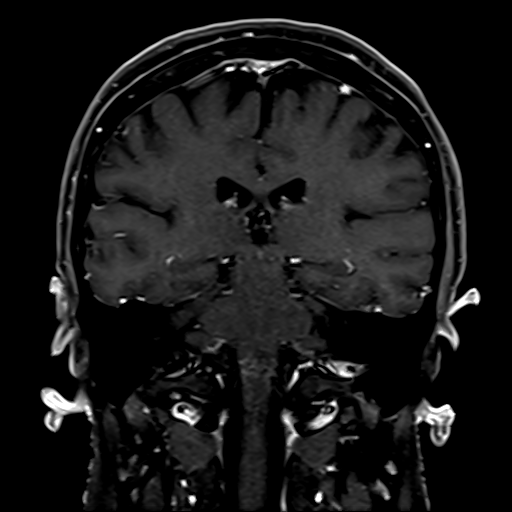

[42 of 48 positions shown; findings below may reference images not displayed]

FINDINGS: MRI HEAD

Brain: There is no acute infarction or intracranial hemorrhage.
There is no intracranial mass, mass effect, edema, hydrocephalus, or
extra-axial fluid collection. Ventricles and sulci are normal in
size and configuration. Small scattered foci of T2 hyperintensity in
the supratentorial white matter are nonspecific but may reflect mild
chronic microvascular ischemic changes. There is a small chronic
infarct of the left cerebellum. No abnormal brain parenchymal
enhancement.

Vascular: Major vessel flow voids at the skull base are preserved.

Skull and upper cervical spine: Normal marrow signal.

Other: Mastoid air cells are clear.

MRI ORBITS

Orbits: There is thickening and enhancement of the right optic nerve
sheath extending from the mid intraorbital portion to
intracanalicular portion. There is no definite abnormal signal of
the optic nerve itself. There is a small focus of extra-axial
enhancement adjacent to the right anterior clinoid process measuring
up to 3 mm (series 30, image 11). Globes are symmetric and
unremarkable. Extraocular muscles and lacrimal glands are
unremarkable. No proptosis.

Visualized sinuses: Trace paranasal sinus mucosal thickening.

Soft tissues: Unremarkable.
IMPRESSION: Fusiform thickening and enhancement of the right optic nerve sheath
as described with adjacent small subcentimeter focus of extra-axial
enhancement along the right anterior clinoid process. This is
favored to reflect a meningioma extending along the nerve sheath.
Other considerations as previously noted include idiopathic orbital
inflammation (pseudotumor), sarcoid, and lymphoma.

Mild chronic microvascular ischemic changes. Small chronic left
cerebellar infarct.

## 2020-08-15 ENCOUNTER — Telehealth: Payer: Self-pay

## 2020-08-15 NOTE — Telephone Encounter (Signed)
Noted  

## 2020-08-15 NOTE — Telephone Encounter (Signed)
Received fax from [facility] saying that the office had contacted the patient and she refused evaluation at this time.

## 2020-08-16 ENCOUNTER — Telehealth: Payer: Self-pay | Admitting: Pharmacist

## 2020-08-16 NOTE — Chronic Care Management (AMB) (Signed)
Chronic Care Management Pharmacy Assistant   Name: Whitney Evans  MRN: [id number] DOB: Aug 25, 1946  Reason for Encounter: Medication Review  PCP : Nelwyn Salisbury, MD  Allergies:   Allergies  Allergen Reactions  . Morphine Sulfate Hives and Shortness Of Breath  . Pneumococcal Vaccine Polyvalent Shortness Of Breath and Swelling    Skin peeled off    Medications: Outpatient Encounter Medications as of 08/16/2020  Medication Sig  . albuterol (PROAIR HFA) 108 (90 Base) MCG/ACT inhaler TAKE 2 PUFFS BY MOUTH EVERY 4 HOURS AS NEEDED  . amLODipine (NORVASC) 5 MG tablet Take 1 tablet (5 mg total) by mouth daily.  Marland Kitchen BLACK PEPPER-TURMERIC PO Take by mouth.  . budesonide-formoterol (SYMBICORT) 80-4.5 MCG/ACT inhaler Inhale 2 puffs into the lungs 2 (two) times daily.  . Calcium Carbonate-Vit D-Min (CALCIUM 1200 PO) Take 1,200 mg by mouth daily.  . Cholecalciferol 25 MCG (1000 UT) tablet Take 1,000 Units by mouth daily.  . Coenzyme Q10 (CO Q 10 PO) Take 100 mg by mouth daily.   Marland Kitchen ipratropium-albuterol (DUONEB) 0.5-2.5 (3) MG/3ML SOLN Take 3 mLs by nebulization every 4 (four) hours as needed (asthma).  Marland Kitchen MAGNESIUM PO Take 450 mg by mouth at bedtime.   . metoprolol succinate (TOPROL-XL) 100 MG 24 hr tablet Take 1 tablet (100 mg total) by mouth daily. Take with or immediately following a meal.  . montelukast (SINGULAIR) 10 MG tablet Take 1 tablet (10 mg total) by mouth daily.  . Multiple Vitamin (MULTIVITAMIN WITH MINERALS) TABS tablet Take 1 tablet by mouth daily.  . NP THYROID 60 MG tablet Take 1 tablet (60 mg total) by mouth daily.  . Omega-3 Fatty Acids (FISH OIL PO) Take 1,300 mg by mouth daily.   Marland Kitchen omeprazole (PRILOSEC) 40 MG capsule TAKE 1 CAPSULE BY MOUTH EVERY DAY   No facility-administered encounter medications on file as of 08/16/2020.    Current Diagnosis: Patient Active Problem List   Diagnosis Date Noted  . Trigger finger, left little finger 10/21/2017  . Osteopenia  12/29/2016  . Complete heart block (HCC) 06/17/2016  . Insomnia 07/03/2014  . Dyslipidemia 01/19/2013  . PHLEBITIS, LOWER EXTREMITY 01/16/2008  . Hypothyroidism 06/03/2007  . HTN (hypertension) 06/03/2007  . NECK PAIN, CHRONIC 06/03/2007  . History of other specified conditions presenting hazards to health 06/03/2007  . ALLERGIC RHINITIS 05/05/2007  . Asthma 05/05/2007  . GERD 05/05/2007    Goals Addressed   None    Reviewed chart for medication changes ahead of medication coordination call. No OVs, Consults, or hospital visits since last care coordination call/Pharmacist visit.  No medication changes indicated   BP Readings from Last 3 Encounters:  03/06/20 126/62  02/15/20 136/70  08/21/19 106/67    Lab Results  Component Value Date   HGBA1C 5.8 11/28/2009     Patient obtains medications through Vials  90 Days   Last adherence delivery included:  . Budesonide-formoterol (SYMBICORT) 80-4.5 MCG/ACT inhaler Np Thyroid 60 mg daily . Metoprolol Succinate ER 100 mg daily  . Omeprazole 40 mg daily . Montelukast 10 mg daily  . Amlodipine 5 mg daily . NP THYROID 60 mg daily  I spoke with the patient and review medications. There are no changes in medications currently. The patient is taking the following medications: . Budesonide-formoterol (SYMBICORT) 80-4.5 MCG/ACT inhaler Np Thyroid 60 mg daily . Metoprolol Succinate ER 100 mg daily  . Omeprazole 40 mg daily . Montelukast 10 mg daily  . Amlodipine 5 mg  daily . NP THYROID 60 mg daily  She currently does not need any refills  Confirmed delivery date of 09-15-2020, advised patient that pharmacy will contact them the morning of delivery.  Follow-Up:  Coordination of Enhanced Pharmacy Services and Pharmacist Review   Berenice Bouton, [facility] Clinical Pharmacy Assistant [phone]

## 2020-08-21 NOTE — Progress Notes (Addendum)
Subjective:   Whitney Evans is a 74 y.o. female who presents for Medicare Annual (Subsequent) preventive examination.  I connected with Whitney Evans  today by telephone and verified that I am speaking with the correct person using two identifiers. Location patient: home Location provider: work Persons participating in the virtual visit: patient, provider.   I discussed the limitations, risks, security and privacy concerns of performing an evaluation and management service by telephone and the availability of in person appointments. I also discussed with the patient that there may be a patient responsible charge related to this service. The patient expressed understanding and verbally consented to this telephonic visit.    Interactive audio and video telecommunications were attempted between this provider and patient, however failed, due to patient having technical difficulties OR patient did not have access to video capability.  We continued and completed visit with audio only.     Review of Systems    N/A  Cardiac Risk Factors include: advanced age (>34men, >102 women);hypertension     Objective:    Today's Vitals   There is no height or weight on file to calculate BMI.  Advanced Directives 08/22/2020 08/21/2019 03/25/2017 06/17/2016 06/17/2016  Does Patient Have a Medical Advance Directive? Yes No Yes Yes Yes  Type of Advance Directive - - Healthcare Power of [place];Living will - Healthcare Power of Attorney  Does patient want to make changes to medical advance directive? Yes (MAU/Ambulatory/Procedural Areas - Information given) - No - Patient declined - No - Patient declined  Copy of Healthcare Power of Attorney in Chart? - - No - copy requested - No - copy requested  Would patient like information on creating a medical advance directive? - Yes (MAU/Ambulatory/Procedural Areas - Information given) - - -    Current Medications (verified) Outpatient Encounter  Medications as of 08/22/2020  Medication Sig  . albuterol (PROAIR HFA) 108 (90 Base) MCG/ACT inhaler TAKE 2 PUFFS BY MOUTH EVERY 4 HOURS AS NEEDED  . amLODipine (NORVASC) 5 MG tablet Take 1 tablet (5 mg total) by mouth daily.  Marland Kitchen BLACK PEPPER-TURMERIC PO Take by mouth.  . budesonide-formoterol (SYMBICORT) 80-4.5 MCG/ACT inhaler Inhale 2 puffs into the lungs 2 (two) times daily.  . Calcium Carbonate-Vit D-Min (CALCIUM 1200 PO) Take 1,200 mg by mouth daily.  . Cholecalciferol 25 MCG (1000 UT) tablet Take 1,000 Units by mouth daily.  . Coenzyme Q10 (CO Q 10 PO) Take 100 mg by mouth daily.   Marland Kitchen MAGNESIUM PO Take 450 mg by mouth at bedtime.   . metoprolol succinate (TOPROL-XL) 100 MG 24 hr tablet Take 1 tablet (100 mg total) by mouth daily. Take with or immediately following a meal.  . montelukast (SINGULAIR) 10 MG tablet Take 1 tablet (10 mg total) by mouth daily.  . Multiple Vitamin (MULTIVITAMIN WITH MINERALS) TABS tablet Take 1 tablet by mouth daily.  . NP THYROID 60 MG tablet Take 1 tablet (60 mg total) by mouth daily.  . Omega-3 Fatty Acids (FISH OIL PO) Take 1,300 mg by mouth daily.   Marland Kitchen omeprazole (PRILOSEC) 40 MG capsule TAKE 1 CAPSULE BY MOUTH EVERY DAY  . FLUZONE HIGH-DOSE QUADRIVALENT 0.7 ML SUSY   . ipratropium-albuterol (DUONEB) 0.5-2.5 (3) MG/3ML SOLN Take 3 mLs by nebulization every 4 (four) hours as needed (asthma). (Patient not taking: Reported on 08/22/2020)   No facility-administered encounter medications on file as of 08/22/2020.    Allergies (verified) Morphine sulfate and Pneumococcal vaccine polyvalent   History: Past Medical  History:  Diagnosis Date  . Allergic rhinitis   . Asthma   . Chronic hoarseness    sees Dr. [name] (ENT) in [place]   . Essential hypertension   . GERD (gastroesophageal reflux disease)   . Hypothyroidism   . Insomnia   . Neck pain, chronic    Past Surgical History:  Procedure Laterality Date  . CESAREAN SECTION     x 2  . COLONOSCOPY   05/24/07   per Dr. Russella Dar, benign polpys, and diverticulosis, repeat 5 years   . EP IMPLANTABLE DEVICE N/A 06/18/2016   Procedure: Pacemaker Implant;  Surgeon: Will [name], MD;  Location: [facility] INVASIVE [facility];  Service: Cardiovascular;  Laterality: N/A;  . EYE SURGERY    . left foot surgery    . VESICOVAGINAL FISTULA CLOSURE W/ TAH    . WISDOM TOOTH EXTRACTION     Family History  Problem Relation Age of Onset  . Coronary artery disease Other   . Diabetes Other   . Hyperlipidemia Other   . Hypertension Other   . Stroke Other   . Stroke Mother   . Heart attack Father 68   Social History   Socioeconomic History  . Marital status: Divorced    Spouse name: Not on file  . Number of children: 2  . Years of education: 4 years college  . Highest education level: Bachelor's degree (e.g., BA, AB, BS)  Occupational History  . Occupation: retired respiratory therapist  Tobacco Use  . Smoking status: Former Games developer  . Smokeless tobacco: Never Used  Substance and Sexual Activity  . Alcohol use: Yes    Alcohol/week: 2.0 standard drinks    Types: 2 Standard drinks or equivalent per week    Comment: glass of red wine  . Drug use: No  . Sexual activity: Not on file  Other Topics Concern  . Not on file  Social History Narrative   Retired Buyer, retail; divorced   1 daughter [place], [place] ICU nurse   1 son in [place] - Acupuncturist   Social Determinants of Health   Financial Resource Strain: Low Risk   . Difficulty of Paying Living Expenses: Not hard at all  Food Insecurity: No Food Insecurity  . Worried About Programme researcher, broadcasting/film/video in the Last Year: Never true  . Ran Out of Food in the Last Year: Never true  Transportation Needs: No Transportation Needs  . Lack of Transportation (Medical): No  . Lack of Transportation (Non-Medical): No  Physical Activity: Sufficiently Active  . Days of Exercise per Week: 7 days  . Minutes of Exercise per Session: 30 min   Stress: No Stress Concern Present  . Feeling of Stress : Not at all  Social Connections: Socially Isolated  . Frequency of Communication with Friends and Family: Three times a week  . Frequency of Social Gatherings with Friends and Family: Once a week  . Attends Religious Services: Never  . Active Member of Clubs or Organizations: No  . Attends Banker Meetings: Never  . Marital Status: Widowed    Tobacco Counseling Counseling given: Not Answered   Clinical Intake:  Pre-visit preparation completed: Yes  Pain : No/denies pain     Nutritional Risks: None Diabetes: No  How often do you need to have someone help you when you read instructions, pamphlets, or other written materials from your doctor or pharmacy?: 1 - Never What is the last grade level you completed in school?: Bachelors Degree  Diabetic?No   Interpreter Needed?: No  Information entered by :: SCrews,LPN   Activities of Daily Living In your present state of health, do you have any difficulty performing the following activities: 08/22/2020  Hearing? N  Vision? N  Difficulty concentrating or making decisions? N  Walking or climbing stairs? N  Dressing or bathing? N  Doing errands, shopping? N  Preparing Food and eating ? N  Using the Toilet? N  In the past six months, have you accidently leaked urine? N  Do you have problems with loss of bowel control? N  Managing your Medications? N  Managing your Finances? N  Housekeeping or managing your Housekeeping? N  Some recent data might be hidden    Patient Care Team: Nelwyn Salisbury, MD as PCP - General (Family Medicine) Regan Lemming, MD as PCP - Electrophysiology (Cardiology) Verner Chol, [facility] as Pharmacist (Pharmacist)  Indicate any recent Medical Services you may have received from other than [facility] providers in the past year (date may be approximate).     Assessment:   This is a routine wellness examination for  Whitney Evans.  Hearing/Vision screen  Hearing Screening   125Hz  250Hz  500Hz  1000Hz  2000Hz  3000Hz  4000Hz  6000Hz  8000Hz   Right ear:           Left ear:           Vision Screening Comments: Patient states gets eyes checked yearly. Denies any vision changes   Dietary issues and exercise activities discussed: Current Exercise Habits: Home exercise routine, Type of exercise: walking, Time (Minutes): 30, Frequency (Times/Week): 7, Weekly Exercise (Minutes/Week): 210, Exercise limited by: respiratory conditions(s)  Goals    . Exercise 150 min/wk Moderate Activity    . Get off of some of my medications    . Pharmacy Care Plan     CARE PLAN ENTRY  Current Barriers:  . Chronic Disease Management support, education, and care coordination needs related to HTN, HLD, and Asthma, GERD, Hypothyroidism, Osteopenia  Pharmacist Clinical Goal(s):  Asthma . Prevent worsening of shortness of breath and hospitalizations. . High blood pressure:  Maintain Blood pressure <130/80 mmHg. Recent reported home blood pressure: 101-117/ 60-68 mmHg. . High cholesterol:  . Cholesterol goals: Total Cholesterol goal under 200, Triglycerides goal under 150, HDL goal above 40 (men) or above 50 (women), LDL goal under 100.  . Current cholesterol levels (03/06/2020): - Total cholesterol: 250 - Triglycerides: 172 - HDL: 63 - LDL: 152 . Hypothyroidism:  . Maintain TSH between 0.45 to 4.5uIU/ml. . Current TSH: 2.71 (10/21/2017) . Heart burn: Minimize reflux symptoms.   Interventions: . Comprehensive medication review performed. . Collaboration with provider re: medication management . Blood pressure:  . Discussed need to continue checking blood pressure at home.  . Discussed diet modifications. DASH diet:  following a diet emphasizing fruits and vegetables and low-fat dairy products along with whole grains, fish, poultry, and nuts. Reducing red meats and sugars.  . Exercising- see exercising section below  . Reducing  the amount of salt intake to 1500mg /per day.  . Recommend using a salt substitute to replace your salt if you need flavor.    . Weight reduction- We discussed losing 5-10% of body weight . Continue: amlodipine 5mg ,1 tablet daily and metoprolol succinate 100mg , 1 tablet daily.  . High cholesterol . How to reduce cholesterol through diet/weight management and physical activity.    . We discussed how a diet high in plant sterols (fruits/vegetables/nuts/whole grains/legumes) may reduce your cholesterol.  Encouraged increasing fiber to a daily intake of 10-25g/day  . Continue: diet modification.  . Recommend researching other cholesterol medications (such as Zetia) to reduce the risk of heart attacks and strokes. . Asthma . Continue:  - Ipratropium/ albuterol 0.5/2.5mg / 43ml, 1 vial via nebulizer every four hours as needed - montelukast (Singulair) 10mg , 1 tablet daily - budesonide/ formoterol (Symbicort 80-4.97mcg/ act inhaler), inhaler 2 puffs twice daily - albuterol (Proair HFA), 2 puffs every four hours as needed. . Discussed rinsing mouth after each use of budesonide/formoterol (Symbicort)  . Hypothyroidism . Recommend annual thyroid blood work.  . Discussed taking thyroid medication consistent in regards to food and other medications.  . Continue: NP thyroid 60mg , 1 tablet daily  . GERD: . Discussed non-pharmacological interventions for acid reflux. Take measures to prevent acid reflux, such as avoiding spicy foods, avoiding caffeine, avoid laying down a few hours after eating, and raising the head of the bed. . Continue: omeprazole 40mg , 1 capsule daily and sodium bicarbonate, 1 tablet daily.  . Bone health . Continue:  calcium carbonate/ vitamin D, 1200mg , 1 tablet daily and cholecalciferol 1,000 units daily  . Recommend obtaining vitamin level blood check.  Patient Self Care Activities:  . Self administers medications as prescribed and Calls provider office for new concerns or  questions . Continue current medications as directed by providers.  . Continue following up with your primary care provider and specialists. . Continue at home blood pressure readings.  . Continue working on health habits (diet/ exercise).  Please see past updates related to this goal by clicking on the "Past Updates" button in the selected goal      . Weight (lb) < 140 lb (63.5 kg)      Depression Screen PHQ 2/9 Scores 08/22/2020 08/21/2019 03/25/2017 09/23/2015 07/03/2014  PHQ - 2 Score 0 2 1 0 0  PHQ- 9 Score 0 3 - - -    Fall Risk Fall Risk  08/22/2020 04/02/2020 08/21/2019 07/28/2019 03/25/2017  Falls in the past year? 0 0 0 0 No  Comment - Emmi Telephone Survey: data to providers prior to load - Emmi Telephone Survey: data to providers prior to load -  Number falls in past yr: 0 - - - -  Injury with Fall? 0 - - - -  Risk for fall due to : No Fall Risks - - - -  Follow up Falls evaluation completed;Falls prevention discussed - - - -    FALL RISK PREVENTION PERTAINING TO THE HOME:  Any stairs in or around the home? No  If so, are there any without handrails? No  Home free of loose throw rugs in walkways, pet beds, electrical cords, etc? Yes  Adequate lighting in your home to reduce risk of falls? Yes   ASSISTIVE DEVICES UTILIZED TO PREVENT FALLS:  Life alert? No  Use of a cane, walker or w/c? No  Grab bars in the bathroom? No  Shower chair or bench in shower? No  Elevated toilet seat or a handicapped toilet? No     Cognitive Function:   Normal cognitive status assessed by direct observation by this Nurse Health Advisor. No abnormalities found.     6CIT Screen 08/21/2019  What Year? 0 points  What month? 0 points  Count back from 20 0 points  Months in reverse 0 points  Repeat phrase 0 points    Immunizations Immunization History  Administered Date(s) Administered  . Influenza, High Dose Seasonal PF 07/24/2016, 04/14/2017, 05/04/2019  .  Influenza,inj,Quad  PF,6+ Mos 07/03/2014  . Influenza-Unspecified 06/11/2015, 04/14/2017, 04/20/2018, 05/04/2019  . PFIZER SARS-COV-2 Vaccination 10/30/2019, 11/20/2019, 08/03/2020  . Tdap 09/23/2015  . Zoster Recombinat (Shingrix) 05/04/2019, 08/24/2019    TDAP status: Up to date  Flu Vaccine status: Up to date  Pneumococcal vaccine status: Due, Education has been provided regarding the importance of this vaccine. Advised may receive this vaccine at local pharmacy or Health Dept. Aware to provide a copy of the vaccination record if obtained from local pharmacy or Health Dept. Verbalized acceptance and understanding.  Covid-19 vaccine status: Completed vaccines  Qualifies for Shingles Vaccine? Yes   Zostavax completed No   Shingrix Completed?: Yes  Screening Tests Health Maintenance  Topic Date Due  . COLONOSCOPY  05/23/2017  . MAMMOGRAM  12/25/2018  . INFLUENZA VACCINE  04/07/2020  . TETANUS/TDAP  09/22/2025  . DEXA SCAN  Completed  . COVID-19 Vaccine  Completed  . Hepatitis C Screening  Completed    Health Maintenance  Health Maintenance Due  Topic Date Due  . COLONOSCOPY  05/23/2017  . MAMMOGRAM  12/25/2018  . INFLUENZA VACCINE  04/07/2020   Colorectal Cancer Screening: Patient declined colorectal screening stating that she does not ever want to have an colonoscopy again   Mammogram Status: Patient would like to hold off on this at this time.    Bone Density status: Ordered 08/22/2020. Pt provided with contact info and advised to call to schedule appt.  Lung Cancer Screening: (Low Dose CT Chest recommended if Age 39-80 years, 30 pack-year currently smoking OR have quit w/in 15years.) does not qualify.   Lung Cancer Screening Referral: N/A   Additional Screening:  Hepatitis C Screening: does qualify; Completed 10/21/2017  Vision Screening: Recommended annual ophthalmology exams for early detection of glaucoma and other disorders of the eye. Is the patient up to date with their  annual eye exam?  Yes  Who is the provider or what is the name of the office in which the patient attends annual eye exams? [facility] [place]  If pt is not established with a provider, would they like to be referred to a provider to establish care? No .   Dental Screening: Recommended annual dental exams for proper oral hygiene  Community Resource Referral / Chronic Care Management: CRR required this visit?  No   CCM required this visit?  No      Plan:     I have personally reviewed and noted the following in the patient's chart:   . Medical and social history . Use of alcohol, tobacco or illicit drugs  . Current medications and supplements . Functional ability and status . Nutritional status . Physical activity . Advanced directives . List of other physicians . Hospitalizations, surgeries, and ER visits in previous 12 months . Vitals . Screenings to include cognitive, depression, and falls . Referrals and appointments  In addition, I have reviewed and discussed with patient certain preventive protocols, quality metrics, and best practice recommendations. A written personalized care plan for preventive services as well as general preventive health recommendations were provided to patient.     Theodora BlowShannon Brittnye Josephs, LPN   16/10/960412/16/2021   Nurse Notes: None

## 2020-08-22 ENCOUNTER — Ambulatory Visit (INDEPENDENT_AMBULATORY_CARE_PROVIDER_SITE_OTHER): Payer: PPO

## 2020-08-22 ENCOUNTER — Other Ambulatory Visit: Payer: Self-pay

## 2020-08-22 ENCOUNTER — Telehealth: Payer: Self-pay | Admitting: Family Medicine

## 2020-08-22 DIAGNOSIS — Z780 Asymptomatic menopausal state: Secondary | ICD-10-CM | POA: Diagnosis not present

## 2020-08-22 DIAGNOSIS — Z0000 Encounter for general adult medical examination without abnormal findings: Secondary | ICD-10-CM | POA: Diagnosis not present

## 2020-08-22 NOTE — Patient Instructions (Signed)
Whitney Evans , Thank you for taking time to come for your Medicare Wellness Visit. I appreciate your ongoing commitment to your health goals. Please review the following plan we discussed and let me know if I can assist you in the future.   Screening recommendations/referrals: Colonoscopy: You declined this screening  Mammogram: Currently due, will discuss with Dr.Fry if it is appropriate due to patient's pacemaker  Bone Density: Currently due, orders placed  This visit  Recommended yearly ophthalmology/optometry visit for glaucoma screening and checkup Recommended yearly dental visit for hygiene and checkup  Vaccinations: Influenza vaccine: Up to date, next due fall 2022 Pneumococcal vaccine: contraindicated due to allergy to vaccine  Tdap vaccine: Up to date, next due 09/22/2025 Shingles vaccine: Completed series     Advanced directives: Please bring in copies of your medical advanced directives so that we may scan them into your chart.  Conditions/risks identified: None   Next appointment: 11/19/2020 @ 10:00 am with Pharmacist    Preventive Care 65 Years and Older, Female Preventive care refers to lifestyle choices and visits with your health care provider that can promote health and wellness. What does preventive care include?  A yearly physical exam. This is also called an annual well check.  Dental exams once or twice a year.  Routine eye exams. Ask your health care provider how often you should have your eyes checked.  Personal lifestyle choices, including:  Daily care of your teeth and gums.  Regular physical activity.  Eating a healthy diet.  Avoiding tobacco and drug use.  Limiting alcohol use.  Practicing safe sex.  Taking low-dose aspirin every day.  Taking vitamin and mineral supplements as recommended by your health care provider. What happens during an annual well check? The services and screenings done by your health care provider during your  annual well check will depend on your age, overall health, lifestyle risk factors, and family history of disease. Counseling  Your health care provider may ask you questions about your:  Alcohol use.  Tobacco use.  Drug use.  Emotional well-being.  Home and relationship well-being.  Sexual activity.  Eating habits.  History of falls.  Memory and ability to understand (cognition).  Work and work Astronomer.  Reproductive health. Screening  You may have the following tests or measurements:  Height, weight, and BMI.  Blood pressure.  Lipid and cholesterol levels. These may be checked every 5 years, or more frequently if you are over 66 years old.  Skin check.  Lung cancer screening. You may have this screening every year starting at age 40 if you have a 30-pack-year history of smoking and currently smoke or have quit within the past 15 years.  Fecal occult blood test (FOBT) of the stool. You may have this test every year starting at age 71.  Flexible sigmoidoscopy or colonoscopy. You may have a sigmoidoscopy every 5 years or a colonoscopy every 10 years starting at age 12.  Hepatitis C blood test.  Hepatitis B blood test.  Sexually transmitted disease (STD) testing.  Diabetes screening. This is done by checking your blood sugar (glucose) after you have not eaten for a while (fasting). You may have this done every 1-3 years.  Bone density scan. This is done to screen for osteoporosis. You may have this done starting at age 50.  Mammogram. This may be done every 1-2 years. Talk to your health care provider about how often you should have regular mammograms. Talk with your health care provider about  your test results, treatment options, and if necessary, the need for more tests. Vaccines  Your health care provider may recommend certain vaccines, such as:  Influenza vaccine. This is recommended every year.  Tetanus, diphtheria, and acellular pertussis (Tdap, Td)  vaccine. You may need a Td booster every 10 years.  Zoster vaccine. You may need this after age 71.  Pneumococcal 13-valent conjugate (PCV13) vaccine. One dose is recommended after age 36.  Pneumococcal polysaccharide (PPSV23) vaccine. One dose is recommended after age 63. Talk to your health care provider about which screenings and vaccines you need and how often you need them. This information is not intended to replace advice given to you by your health care provider. Make sure you discuss any questions you have with your health care provider. Document Released: 09/20/2015 Document Revised: 05/13/2016 Document Reviewed: 06/25/2015 Elsevier Interactive Patient Education  2017 [employer] Prevention in the Home Falls can cause injuries. They can happen to people of all ages. There are many things you can do to make your home safe and to help prevent falls. What can I do on the outside of my home?  Regularly fix the edges of walkways and driveways and fix any cracks.  Remove anything that might make you trip as you walk through a door, such as a raised step or threshold.  Trim any bushes or trees on the path to your home.  Use bright outdoor lighting.  Clear any walking paths of anything that might make someone trip, such as rocks or tools.  Regularly check to see if handrails are loose or broken. Make sure that both sides of any steps have handrails.  Any raised decks and porches should have guardrails on the edges.  Have any leaves, snow, or ice cleared regularly.  Use sand or salt on walking paths during winter.  Clean up any spills in your garage right away. This includes oil or grease spills. What can I do in the bathroom?  Use night lights.  Install grab bars by the toilet and in the tub and shower. Do not use towel bars as grab bars.  Use non-skid mats or decals in the tub or shower.  If you need to sit down in the shower, use a plastic, non-slip  stool.  Keep the floor dry. Clean up any water that spills on the floor as soon as it happens.  Remove soap buildup in the tub or shower regularly.  Attach bath mats securely with double-sided non-slip rug tape.  Do not have throw rugs and other things on the floor that can make you trip. What can I do in the bedroom?  Use night lights.  Make sure that you have a light by your bed that is easy to reach.  Do not use any sheets or blankets that are too big for your bed. They should not hang down onto the floor.  Have a firm chair that has side arms. You can use this for support while you get dressed.  Do not have throw rugs and other things on the floor that can make you trip. What can I do in the kitchen?  Clean up any spills right away.  Avoid walking on wet floors.  Keep items that you use a lot in easy-to-reach places.  If you need to reach something above you, use a strong step stool that has a grab bar.  Keep electrical cords out of the way.  Do not use floor polish or wax  that makes floors slippery. If you must use wax, use non-skid floor wax.  Do not have throw rugs and other things on the floor that can make you trip. What can I do with my stairs?  Do not leave any items on the stairs.  Make sure that there are handrails on both sides of the stairs and use them. Fix handrails that are broken or loose. Make sure that handrails are as long as the stairways.  Check any carpeting to make sure that it is firmly attached to the stairs. Fix any carpet that is loose or worn.  Avoid having throw rugs at the top or bottom of the stairs. If you do have throw rugs, attach them to the floor with carpet tape.  Make sure that you have a light switch at the top of the stairs and the bottom of the stairs. If you do not have them, ask someone to add them for you. What else can I do to help prevent falls?  Wear shoes that:  Do not have high heels.  Have rubber bottoms.  Are  comfortable and fit you well.  Are closed at the toe. Do not wear sandals.  If you use a stepladder:  Make sure that it is fully opened. Do not climb a closed stepladder.  Make sure that both sides of the stepladder are locked into place.  Ask someone to hold it for you, if possible.  Clearly mark and make sure that you can see:  Any grab bars or handrails.  First and last steps.  Where the edge of each step is.  Use tools that help you move around (mobility aids) if they are needed. These include:  Canes.  Walkers.  Scooters.  Crutches.  Turn on the lights when you go into a dark area. Replace any light bulbs as soon as they burn out.  Set up your furniture so you have a clear path. Avoid moving your furniture around.  If any of your floors are uneven, fix them.  If there are any pets around you, be aware of where they are.  Review your medicines with your doctor. Some medicines can make you feel dizzy. This can increase your chance of falling. Ask your doctor what other things that you can do to help prevent falls. This information is not intended to replace advice given to you by your health care provider. Make sure you discuss any questions you have with your health care provider. Document Released: 06/20/2009 Document Revised: 01/30/2016 Document Reviewed: 09/28/2014 Elsevier Interactive Patient Education  2017 [employer].

## 2020-08-22 NOTE — Telephone Encounter (Signed)
I informed patient during her Annual Medicare wellness visit that she is due for a mammogram. Patient would like to know if there is any contraindication due to her having a pacemaker. Please advise?

## 2020-08-27 NOTE — Telephone Encounter (Signed)
She can safely get a regular mammogram or a breast US. She just can't get a breast MRI

## 2020-08-29 NOTE — Telephone Encounter (Signed)
Mychart message sent to patient informing her of recommendations from Dr. Clent Ridges

## 2020-09-11 ENCOUNTER — Ambulatory Visit (INDEPENDENT_AMBULATORY_CARE_PROVIDER_SITE_OTHER): Payer: PPO

## 2020-09-11 DIAGNOSIS — I442 Atrioventricular block, complete: Secondary | ICD-10-CM | POA: Diagnosis not present

## 2020-09-11 LAB — CUP PACEART REMOTE DEVICE CHECK
Battery Remaining Longevity: 112 mo
Battery Remaining Percentage: 95.5 %
Battery Voltage: 2.99 V
Brady Statistic AP VP Percent: 10 %
Brady Statistic AP VS Percent: 20 %
Brady Statistic AS VP Percent: 57 %
Brady Statistic AS VS Percent: 13 %
Brady Statistic RA Percent Paced: 30 %
Brady Statistic RV Percent Paced: 67 %
Date Time Interrogation Session: 20220105065555
Implantable Lead Implant Date: 20171012
Implantable Lead Implant Date: 20171012
Implantable Lead Location: 753859
Implantable Lead Location: 753860
Implantable Pulse Generator Implant Date: 20171012
Lead Channel Impedance Value: 430 Ohm
Lead Channel Impedance Value: 580 Ohm
Lead Channel Pacing Threshold Amplitude: 0.5 V
Lead Channel Pacing Threshold Amplitude: 0.5 V
Lead Channel Pacing Threshold Pulse Width: 0.5 ms
Lead Channel Pacing Threshold Pulse Width: 0.5 ms
Lead Channel Sensing Intrinsic Amplitude: 2 mV
Lead Channel Sensing Intrinsic Amplitude: 5.6 mV
Lead Channel Setting Pacing Amplitude: 2 V
Lead Channel Setting Pacing Amplitude: 2.5 V
Lead Channel Setting Pacing Pulse Width: 0.5 ms
Lead Channel Setting Sensing Sensitivity: 2 mV
Pulse Gen Model: 2272
Pulse Gen Serial Number: 7955201

## 2020-09-15 ENCOUNTER — Other Ambulatory Visit: Payer: Self-pay | Admitting: Family Medicine

## 2020-09-16 ENCOUNTER — Telehealth: Payer: Self-pay | Admitting: Pharmacist

## 2020-09-16 NOTE — Chronic Care Management (AMB) (Signed)
Chronic Care Management Pharmacy Assistant   Name: Whitney Evans  MRN: [id number] DOB: [birth year]  Reason for Encounter: Medication Review  PCP : Nelwyn Salisbury, MD  Allergies:   Allergies  Allergen Reactions  . Morphine Sulfate Hives and Shortness Of Breath  . Pneumococcal Vaccine Polyvalent Shortness Of Breath and Swelling    Skin peeled off    Medications: Outpatient Encounter Medications as of 09/16/2020  Medication Sig  . albuterol (PROAIR HFA) 108 (90 Base) MCG/ACT inhaler TAKE 2 PUFFS BY MOUTH EVERY 4 HOURS AS NEEDED  . amLODipine (NORVASC) 5 MG tablet Take 1 tablet (5 mg total) by mouth daily.  Marland Kitchen BLACK PEPPER-TURMERIC PO Take by mouth.  . budesonide-formoterol (SYMBICORT) 80-4.5 MCG/ACT inhaler Inhale 2 puffs into the lungs 2 (two) times daily.  . Calcium Carbonate-Vit D-Min (CALCIUM 1200 PO) Take 1,200 mg by mouth daily.  . Cholecalciferol 25 MCG (1000 UT) tablet Take 1,000 Units by mouth daily.  . Coenzyme Q10 (CO Q 10 PO) Take 100 mg by mouth daily.   Marland Kitchen FLUZONE HIGH-DOSE QUADRIVALENT 0.7 ML SUSY   . ipratropium-albuterol (DUONEB) 0.5-2.5 (3) MG/3ML SOLN Take 3 mLs by nebulization every 4 (four) hours as needed (asthma). (Patient not taking: Reported on 08/22/2020)  . MAGNESIUM PO Take 450 mg by mouth at bedtime.   . metoprolol succinate (TOPROL-XL) 100 MG 24 hr tablet Take 1 tablet (100 mg total) by mouth daily. Take with or immediately following a meal.  . montelukast (SINGULAIR) 10 MG tablet Take 1 tablet (10 mg total) by mouth daily.  . Multiple Vitamin (MULTIVITAMIN WITH MINERALS) TABS tablet Take 1 tablet by mouth daily.  . NP THYROID 60 MG tablet Take 1 tablet (60 mg total) by mouth daily.  . Omega-3 Fatty Acids (FISH OIL PO) Take 1,300 mg by mouth daily.   Marland Kitchen omeprazole (PRILOSEC) 40 MG capsule TAKE 1 CAPSULE BY MOUTH EVERY DAY   No facility-administered encounter medications on file as of 09/16/2020.    Current Diagnosis: Patient Active Problem List    Diagnosis Date Noted  . Trigger finger, left little finger 10/21/2017  . Osteopenia 12/29/2016  . Complete heart block (HCC) 06/17/2016  . Insomnia 07/03/2014  . Dyslipidemia 01/19/2013  . PHLEBITIS, LOWER EXTREMITY 01/16/2008  . Hypothyroidism 06/03/2007  . HTN (hypertension) 06/03/2007  . NECK PAIN, CHRONIC 06/03/2007  . History of other specified conditions presenting hazards to health 06/03/2007  . ALLERGIC RHINITIS 05/05/2007  . Asthma 05/05/2007  . GERD 05/05/2007    Goals Addressed   None    Reviewed chart for medication changes ahead of medication coordination call. No OVs, Consults, or hospital visits since last care coordination call/Pharmacist visit.  No medication changes indicated.  BP Readings from Last 3 Encounters:  03/06/20 126/62  02/15/20 136/70  08/21/19 106/67    Lab Results  Component Value Date   HGBA1C 5.8 11/28/2009     Patient obtains medications through Adherence Packaging  90 Days   Last adherence delivery included:   Budesonide-formoterol (SYMBICORT) 80-4.5 MCG/ACT inhaler Np Thyroid 60 mg daily  Metoprolol Succinate ER 100 mg: one tablet daily  Omeprazole 40 mg: one tablet daily  Montelukast 10 mg: one tablet daily  Amlodipine 5 mg: one tablet daily  NP THYROID 60 mg: one tablet daily Patient declined the following medication last month due to PRN use.  - Ipratropium-albuterol (DUONEB) 0.5-2.5 (3) MG/3 ML SOLN  - Albuterol (PROAIR HFA) 108 (90 Base) MCG/ACT inhaler   Patient is  due for next adherence delivery on: 09-25-2020. Called patient and reviewed medications and coordinated delivery. This delivery to include:  Budesonide-formoterol (SYMBICORT) 80-4.5 MCG/ACT inhaler Np Thyroid 60 mg daily  Metoprolol Succinate ER 100 mg: one tablet daily  Omeprazole 40 mg: one tablet daily  Montelukast 10 mg: one tablet daily  Amlodipine 5 mg: one tablet daily  NP THYROID 60 mg: one tablet daily I spoke with the patient and  review medications. There are no changes in medications currently. Patient declined these last month due to PRN use/additional supply on hand.The patient is taking the following medications: - Ipratropium-albuterol (DUONEB) 0.5-2.5 (3) MG/3 ML SOLN  - Albuterol (PROAIR HFA) 108 (90 Base) MCG/ACT inhaler  Patient needs refills for: - Omeprazole 40 mg: one tablet daily - Montelukast 10 mg: one tablet daily - Amlodipine 5 mg: one tablet daily   Confirmed delivery date of 09-25-2020 advised patient that pharmacy will contact them the morning of delivery. Follow-Up:  Coordination of Enhanced Pharmacy Services and Pharmacist Review   Berenice Bouton, [facility] Clinical Pharmacy Assistant [phone]

## 2020-09-17 MED ORDER — AMLODIPINE BESYLATE 5 MG PO TABS
5.0000 mg | ORAL_TABLET | Freq: Every day | ORAL | 3 refills | Status: DC
Start: 2020-09-17 — End: 2021-09-05

## 2020-09-17 MED ORDER — MONTELUKAST SODIUM 10 MG PO TABS: 10.0000 mg | ORAL_TABLET | Freq: Every day | ORAL | 3 refills | Status: DC

## 2020-09-17 MED ORDER — OMEPRAZOLE 40 MG PO CPDR: DELAYED_RELEASE_CAPSULE | ORAL | 3 refills | Status: DC

## 2020-09-17 NOTE — Telephone Encounter (Signed)
Done

## 2020-09-17 NOTE — Telephone Encounter (Signed)
-----   Message from Chinita Greenland sent at 09/16/2020  2:21 PM EST ----- Regarding: Medication Refill Good Afternoon,   Can you please send a 90 day supply the following medication to [facility].  " Omeprazole 40 mg: one tablet daily " Montelukast 10 mg: one tablet daily " Amlodipine 5 mg: one tablet daily   Thanks you for your help,  Berenice Bouton, [facility] Clinical Pharmacy Assistant [phone]

## 2020-09-25 NOTE — Progress Notes (Signed)
Remote pacemaker transmission.   

## 2020-10-15 ENCOUNTER — Telehealth: Payer: Self-pay | Admitting: Pharmacist

## 2020-10-15 NOTE — Chronic Care Management (AMB) (Addendum)
Chronic Care Management Pharmacy Assistant   Name: Whitney Evans  MRN: [id number] DOB: [birth year]  Reason for Encounter: Disease State and Medication Review  PCP : Nelwyn Salisbury, MD  Allergies:   Allergies  Allergen Reactions   Morphine Sulfate Hives and Shortness Of Breath   Pneumococcal Vaccine Polyvalent Shortness Of Breath and Swelling    Skin peeled off    Medications: Outpatient Encounter Medications as of 10/15/2020  Medication Sig   albuterol (PROAIR HFA) 108 (90 Base) MCG/ACT inhaler TAKE 2 PUFFS BY MOUTH EVERY 4 HOURS AS NEEDED   amLODipine (NORVASC) 5 MG tablet Take 1 tablet (5 mg total) by mouth daily.   BLACK PEPPER-TURMERIC PO Take by mouth.   budesonide-formoterol (SYMBICORT) 80-4.5 MCG/ACT inhaler Inhale 2 puffs into the lungs 2 (two) times daily.   Calcium Carbonate-Vit D-Min (CALCIUM 1200 PO) Take 1,200 mg by mouth daily.   Cholecalciferol 25 MCG (1000 UT) tablet Take 1,000 Units by mouth daily.   Coenzyme Q10 (CO Q 10 PO) Take 100 mg by mouth daily.    FLUZONE HIGH-DOSE QUADRIVALENT 0.7 ML SUSY    ipratropium-albuterol (DUONEB) 0.5-2.5 (3) MG/3ML SOLN Take 3 mLs by nebulization every 4 (four) hours as needed (asthma). (Patient not taking: Reported on 08/22/2020)   MAGNESIUM PO Take 450 mg by mouth at bedtime.    metoprolol succinate (TOPROL-XL) 100 MG 24 hr tablet Take 1 tablet (100 mg total) by mouth daily. Take with or immediately following a meal.   montelukast (SINGULAIR) 10 MG tablet Take 1 tablet (10 mg total) by mouth daily.   Multiple Vitamin (MULTIVITAMIN WITH MINERALS) TABS tablet Take 1 tablet by mouth daily.   NP THYROID 60 MG tablet Take 1 tablet (60 mg total) by mouth daily.   Omega-3 Fatty Acids (FISH OIL PO) Take 1,300 mg by mouth daily.    omeprazole (PRILOSEC) 40 MG capsule TAKE 1 CAPSULE BY MOUTH EVERY DAY   No facility-administered encounter medications on file as of 10/15/2020.    Current Diagnosis: Patient Active Problem List    Diagnosis Date Noted   Trigger finger, left little finger 10/21/2017   Osteopenia 12/29/2016   Complete heart block (HCC) 06/17/2016   Insomnia 07/03/2014   Dyslipidemia 01/19/2013   PHLEBITIS, LOWER EXTREMITY 01/16/2008   Hypothyroidism 06/03/2007   HTN (hypertension) 06/03/2007   NECK PAIN, CHRONIC 06/03/2007   History of other specified conditions presenting hazards to health 06/03/2007   ALLERGIC RHINITIS 05/05/2007   Asthma 05/05/2007   GERD 05/05/2007    Goals Addressed   None    Reviewed chart for medication changes ahead of medication coordination call. No OVs, Consults, or hospital visits since last care coordination call/Pharmacist visit.  No medication changes indicated.  BP Readings from Last 3 Encounters:  03/06/20 126/62  02/15/20 136/70  08/21/19 106/67    Lab Results  Component Value Date   HGBA1C 5.8 11/28/2009     Patient obtains medications through Vials  90 Days  Last adherence delivery included:  Budesonide-formoterol (SYMBICORT) 80-4.5 MCG/ACT inhaler  Metoprolol Succinate ER 100 mg: one tablet daily Omeprazole 40 mg: one tablet daily Montelukast 10 mg: one tablet daily Amlodipine 5 mg: one tablet daily NP THYROID 60 mg: one tablet daily  Patient declined the following medication last month due to PRN use. Ipratropium-albuterol (DUONEB) 0.5-2.5 (3) MG/3 ML SOLN  Albuterol (PROAIR HFA) 108 (90 Base) MCG/ACT inhaler  I spoke with the patient and review medications. There are no changes in medications  currently. Patient declined these medication this month due to PRN use/additional supply on hand.The patient is taking the following medications: Budesonide-formoterol (SYMBICORT) 80-4.5 MCG/ACT inhaler Np Thyroid 60 mg daily Metoprolol Succinate ER 100 mg: one tablet daily Omeprazole 40 mg: one tablet daily Montelukast 10 mg: one tablet daily Amlodipine 5 mg: one tablet daily NP THYROID 60 mg: one tablet daily Ipratropium-albuterol (DUONEB)  0.5-2.5 (3) MG/3 ML SOLN  Albuterol (PROAIR HFA) 108 (90 Base) MCG/ACT inhaler  She currently does not need any refills. Confirmed delivery date of 12/23/2020, advised patient that pharmacy will contact them the morning of delivery.  Follow-Up:  Coordination of Enhanced Pharmacy Services and Pharmacist Review  Current Asthma regimen:  Ipratropium/ albuterol 0.5/2.5 mg/ 3 ml, 1 vial via nebulizer every four hours as needed Montelukast 10 mg, 1 tablet daily  Budesonide/ formoterol (Symbicort 80-4.5 mcg/ act inhaler), inhaler 2 puffs twice daily Albuterol (Proair HFA), 2 puffs every four hours as needed  Any recent hospitalizations or ED visits since last visit with CPP? No She denies Asthma symptoms, including Increased shortness of breath , Rescue medicine is not helping and Shortness of breath at rest What recent interventions/DTPs have been made by any provider to improve breathing since last visit: Have you had exacerbation/flare-up since last visit? No What do you do when you are short of breath?  Rest Respiratory Devices/Equipment Do you have a nebulizer? Yes Do you use a Peak Flow Meter? No Do you use a maintenance inhaler? Yes How often do you forget to use your daily inhaler? Never Do you use a rescue inhaler? Yes How often do you use your rescue inhaler?  1-2x per week Do you use a spacer with your inhaler? No  Adherence Review: Does the patient have >5 day gap between last estimated fill date for maintenance inhaler medications? No   Berenice Bouton, [facility] Clinical Pharmacy Assistant [phone]

## 2020-11-18 ENCOUNTER — Telehealth: Payer: Self-pay | Admitting: Pharmacist

## 2020-11-18 NOTE — Chronic Care Management (AMB) (Addendum)
11/18/2020- Called patient to remind of telephone appointment with Gaylord Shih for 11/19/2020 @ 10:00am. Patient aware but wondered why she had to be called and what was it for. Discussed with patient the benefits of getting a call from the pharmacist. Explained that Gaylord Shih, CPP works closely with Dr Clent Ridges on her health and medications. Patient stated Dr Clent Ridges knows that she is ok with her medications, she has been on them for a long time and there are no problems. Explained to patient that Sheran Lawless will review her medications with her and discuss finding that she might see that Dr Clent Ridges or another provider may not see to prevent any problems with mediations or health. Patient began to understand the reason for the call and confirms appointment for tomorrow.   Billee Cashing, CMA Clinical Pharmacist Assistant [phone]

## 2020-11-19 ENCOUNTER — Ambulatory Visit (INDEPENDENT_AMBULATORY_CARE_PROVIDER_SITE_OTHER): Payer: PPO | Admitting: Pharmacist

## 2020-11-19 DIAGNOSIS — E785 Hyperlipidemia, unspecified: Secondary | ICD-10-CM | POA: Diagnosis not present

## 2020-11-19 DIAGNOSIS — I10 Essential (primary) hypertension: Secondary | ICD-10-CM | POA: Diagnosis not present

## 2020-11-19 DIAGNOSIS — Z780 Asymptomatic menopausal state: Secondary | ICD-10-CM

## 2020-11-19 DIAGNOSIS — M8580 Other specified disorders of bone density and structure, unspecified site: Secondary | ICD-10-CM

## 2020-11-19 NOTE — Progress Notes (Signed)
Chronic Care Management Pharmacy Note  11/26/2020 Name:  Whitney Evans MRN:  [id number] DOB:  06/30/1946  Subjective: Whitney Evans is an 75 y.o. year old female who is a primary patient of Laurey Morale, MD.  The CCM team was consulted for assistance with disease management and care coordination needs.    Engaged with patient by telephone for follow up visit in response to provider referral for pharmacy case management and/or care coordination services.   Consent to Services:  The patient was given information about Chronic Care Management services, agreed to services, and gave verbal consent prior to initiation of services.  Please see initial visit note for detailed documentation.   Patient Care Team: Laurey Morale, MD as PCP - General (Family Medicine) Constance Haw, MD as PCP - Electrophysiology (Cardiology) Viona Gilmore, [facility] as Pharmacist (Pharmacist)  Recent office visits: 08/22/20 [name], LPN: Patient presented for AWV.  03/06/20 Dr. Sarajane Jews - Patient presented for chronic care office visit. BP has been good in the mornings but increases by around 7pm every day. Metoprolol increased to 100 mg in the morning.  Recent consult visits: 09/11/20 Roma Kayser, RN (Cardiology): Patient presented for remote device check.  06/12/20 Roma Kayser, RN (Cardiology): Patient presented for remote device check.  03/14/20 - Tyson Dense (neuro ophthalmology) - Patient presented to discuss abnormal findings from CT on 02/14/20. Follow up in 6 months.  02/15/2020- Cardiologist- Patient presented to Dr. Allegra Lai, MD for AV block. Pt denies symptoms of palpitations, chest pain, shortness of breath, orthopnea, etc. Vitamin D supplementation decreased to 1,000 units daily.  02/01/20 - Darleen Crocker (ophthalmology) - Patient presented for follow up for cataracts. Unable to access notes.  Hospital visits: None in previous 6 months  Objective:  Lab Results   Component Value Date   CREATININE 0.87 03/06/2020   BUN 18 03/06/2020   GFR 64.03 03/06/2020   GFRNONAA 65 06/16/2017   GFRAA 74 06/16/2017   NA 139 03/06/2020   K 5.0 03/06/2020   CALCIUM 9.9 03/06/2020   CO2 28 03/06/2020    Lab Results  Component Value Date/Time   HGBA1C 5.8 11/28/2009 12:00 AM   GFR 64.03 03/06/2020 09:59 AM   GFR 65.89 10/21/2017 09:07 AM   MICROALBUR 1.1 11/28/2009 12:00 AM    Last diabetic Eye exam: No results found for: HMDIABEYEEXA  Last diabetic Foot exam: No results found for: HMDIABFOOTEX   Lab Results  Component Value Date   CHOL 250 (H) 03/06/2020   HDL 63.10 03/06/2020   LDLCALC 152 (H) 03/06/2020   LDLDIRECT 173.6 12/03/2011   TRIG 172.0 (H) 03/06/2020   CHOLHDL 4 03/06/2020    Hepatic Function Latest Ref Rng & Units 03/06/2020 10/21/2017 09/23/2015  Total Protein 6.0 - 8.3 g/dL 7.1 7.1 7.5  Albumin 3.5 - 5.2 g/dL 4.3 4.1 4.2  AST 0 - 37 U/L $Remo'14 14 16  'ggKNV$ ALT 0 - 35 U/L $Remo'19 17 16  'tADbQ$ Alk Phosphatase 39 - 117 U/L 77 65 66  Total Bilirubin 0.2 - 1.2 mg/dL 0.4 0.4 0.4  Bilirubin, Direct 0.0 - 0.3 mg/dL 0.1 0.1 0.1    Lab Results  Component Value Date/Time   TSH 1.84 03/06/2020 09:59 AM   TSH 2.71 10/21/2017 09:07 AM   FREET4 0.70 03/06/2020 09:59 AM   FREET4 1.10 01/12/2012 02:51 PM    CBC Latest Ref Rng & Units 03/06/2020 10/21/2017 06/17/2016  WBC 4.0 - 10.5 K/uL 9.7 6.7 16.3(H)  Hemoglobin 12.0 -  15.0 g/dL 13.3 14.3 12.6  Hematocrit 36.0 - 46.0 % 40.8 42.5 37.6  Platelets 150.0 - 400.0 K/uL 368.0 361.0 322    No results found for: VD25OH  Clinical ASCVD: No  The ASCVD Risk score Mikey Bussing DC Jr., et al., 2013) failed to calculate for the following reasons:   The systolic blood pressure is missing    Depression screen [facility] 2/9 08/22/2020 08/21/2019 03/25/2017  Decreased Interest 0 1 0  Down, Depressed, Hopeless 0 1 1  PHQ - 2 Score 0 2 1  Altered sleeping 0 1 -  Tired, decreased energy 0 0 -  Change in appetite 0 0 -  Feeling bad or  failure about yourself  0 0 -  Trouble concentrating 0 0 -  Moving slowly or fidgety/restless 0 0 -  Suicidal thoughts - 0 -  PHQ-9 Score 0 3 -  Difficult doing work/chores Not difficult at all Not difficult at all -     No flowsheet data found.  No flowsheet data found.   Social History   Tobacco Use  Smoking Status Former Smoker  Smokeless Tobacco Never Used   BP Readings from Last 3 Encounters:  03/06/20 126/62  02/15/20 136/70  08/21/19 106/67   Pulse Readings from Last 3 Encounters:  03/06/20 71  02/15/20 (!) 102  08/21/19 60   Wt Readings from Last 3 Encounters:  03/06/20 195 lb (88.5 kg)  08/21/19 198 lb (89.8 kg)  06/21/18 213 lb (96.6 kg)    Assessment/Interventions: Review of patient past medical history, allergies, medications, health status, including review of consultants reports, laboratory and other test data, was performed as part of comprehensive evaluation and provision of chronic care management services.   SDOH:  (Social Determinants of Health) assessments and interventions performed: No    CCM Care Plan  Allergies  Allergen Reactions  . Morphine Sulfate Hives and Shortness Of Breath  . Pneumococcal Vaccine Polyvalent Shortness Of Breath and Swelling    Skin peeled off    Medications Reviewed Today    Reviewed by Launa Grill, LPN (Licensed Practical Nurse) on 08/22/20 at [id number]  Med List Status: <None>  Medication Order Taking? Sig Documenting Provider Last Dose Status Informant  albuterol (PROAIR HFA) 108 (90 Base) MCG/ACT inhaler [id number] Yes TAKE 2 PUFFS BY MOUTH EVERY 4 HOURS AS NEEDED Laurey Morale, MD Taking Active   amLODipine (NORVASC) 5 MG tablet [id number] Yes Take 1 tablet (5 mg total) by mouth daily. Laurey Morale, MD Taking Active   BLACK PEPPER-TURMERIC PO [id number] Yes Take by mouth. [provider] Taking Active Self  budesonide-formoterol (SYMBICORT) 80-4.5 MCG/ACT inhaler [id number] Yes Inhale 2 puffs into the  lungs 2 (two) times daily. Laurey Morale, MD Taking Active   Calcium Carbonate-Vit D-Min (CALCIUM 1200 PO) [id number] Yes Take 1,200 mg by mouth daily. [provider] Taking Active Self  Cholecalciferol 25 MCG (1000 UT) tablet [id number] Yes Take 1,000 Units by mouth daily. [provider] Taking Active Self  Coenzyme Q10 (CO Q 10 PO) [id number] Yes Take 100 mg by mouth daily.  [provider] Taking Active Self  ipratropium-albuterol (DUONEB) 0.5-2.5 (3) MG/3ML SOLN [id number] No Take 3 mLs by nebulization every 4 (four) hours as needed (asthma).  Patient not taking: Reported on 08/22/2020   Laurey Morale, MD Not Taking Active   MAGNESIUM PO [id number] Yes Take 450 mg by mouth at bedtime.  [provider] Taking Active Self  metoprolol succinate (TOPROL-XL) 100 MG  24 hr tablet [id number] Yes Take 1 tablet (100 mg total) by mouth daily. Take with or immediately following a meal. Laurey Morale, MD Taking Active   montelukast (SINGULAIR) 10 MG tablet [id number] Yes Take 1 tablet (10 mg total) by mouth daily. Laurey Morale, MD Taking Active   Multiple Vitamin (MULTIVITAMIN WITH MINERALS) TABS tablet [id number] Yes Take 1 tablet by mouth daily. [provider] Taking Active Self  NP THYROID 60 MG tablet [id number] Yes Take 1 tablet (60 mg total) by mouth daily. Laurey Morale, MD Taking Active   Omega-3 Fatty Acids (FISH OIL PO) [id number] Yes Take 1,300 mg by mouth daily.  [provider] Taking Active Self  omeprazole (PRILOSEC) 40 MG capsule [id number] Yes TAKE 1 CAPSULE BY MOUTH EVERY DAY Laurey Morale, MD Taking Active           Patient Active Problem List   Diagnosis Date Noted  . Trigger finger, left little finger 10/21/2017  . Osteopenia 12/29/2016  . Complete heart block ([place]) 06/17/2016  . Insomnia 07/03/2014  . Dyslipidemia 01/19/2013  . PHLEBITIS, LOWER EXTREMITY 01/16/2008  . Hypothyroidism 06/03/2007  . HTN (hypertension)  06/03/2007  . NECK PAIN, CHRONIC 06/03/2007  . History of other specified conditions presenting hazards to health 06/03/2007  . ALLERGIC RHINITIS 05/05/2007  . Asthma 05/05/2007  . GERD 05/05/2007    Immunization History  Administered Date(s) Administered  . Influenza, High Dose Seasonal PF 07/24/2016, 04/14/2017, 05/04/2019  . Influenza,inj,Quad PF,6+ Mos 07/03/2014  . Influenza-Unspecified 06/11/2015, 04/14/2017, 04/20/2018, 05/04/2019  . PFIZER(Purple Top)SARS-COV-2 Vaccination 10/30/2019, 11/20/2019, 08/03/2020  . Tdap 09/23/2015  . Zoster Recombinat (Shingrix) 05/04/2019, 08/24/2019    Conditions to be addressed/monitored:  Hypertension, Hyperlipidemia, GERD, Asthma, Hypothyroidism and Osteopenia  Care Plan : CCM Pharmacy Care Plan  Updates made by Viona Gilmore, [place] since 11/26/2020 12:00 AM    Problem: Problem: Hypertension, Hyperlipidemia, GERD, Asthma, Hypothyroidism and Osteopenia     Long-Range Goal: Patient-Specific Goal   Start Date: 11/19/2020  Expected End Date: 11/19/2021  This Visit's Progress: On track  Priority: High  Note:   Current Barriers:  . Unable to independently monitor therapeutic efficacy  Pharmacist Clinical Goal(s):  Marland Kitchen Patient will achieve adherence to monitoring guidelines and medication adherence to achieve therapeutic efficacy . achieve control of cholesterol as evidenced by next lipid panel  through collaboration with PharmD and provider.   Interventions: . 1:1 collaboration with Laurey Morale, MD regarding development and update of comprehensive plan of care as evidenced by provider attestation and co-signature . Inter-disciplinary care team collaboration (see longitudinal plan of care) . Comprehensive medication review performed; medication list updated in electronic medical record  Hypertension (BP goal <140/90) -Controlled -Current treatment:  amlodipine 5mg ,1 tablet daily  metoprolol succinate 100mg , 1 tablet  daily -Medications previously tried: lisinopril, losartan -Current home readings: 120/60-65s (couple times of day) -Current dietary habits: patient reports cooking at home all the time and making sure to add salt in moderation to food -Current exercise habits: walking around yard and house (on nice days) -Denies hypotensive/hypertensive symptoms -Educated on Exercise goal of 150 minutes per week; Importance of home blood pressure monitoring; -Counseled to monitor BP at home at least weekly, document, and provide log at future appointments -Recommended to continue current medication  Hyperlipidemia: (LDL goal < 100) -Uncontrolled -Current treatment: . omega-3 fatty acids 1 capsule daily  -Medications previously tried: atorvastatin (myalgias)  -Current dietary patterns: eating more fiber in her diet; using an air  fryer to fry foods -Current exercise habits: walking (on nice days) -Educated on Cholesterol goals;  Importance of limiting foods high in cholesterol; Exercise goal of 150 minutes per week; Benefits of Zetia for LDL lowering and ASCVD prevention -Counseled on diet and exercise extensively Patient prefers to continue with diet and exercise modifications prior to trying another medication  Asthma (Goal: control symptoms) -Controlled -Current treatment  . Symbicort 80-4.5 mcg/act inhaler inhale 2 puffs twice daily  Ipratropium/albuterol 0.5/2.5mg / 65ml, 1 vial via nebulizer every four hours as needed  montelukast 10mg , 1 tablet daily   albuterol (Proair HFA), 2 puffs every four hours as needed  -Medications previously tried: Qvar  -Pulmonary function testing: n/a -Exacerbations requiring treatment in last 6 months: none -Patient reports consistent use of maintenance inhaler -Frequency of rescue inhaler use: 3-4 times a week -Counseled on Benefits of consistent maintenance inhaler use When to use rescue inhaler Differences between maintenance and rescue  inhalers -Recommended to continue current medication May need to consider dose increase of Symbicort for better symptom control  GERD (Goal: minimize symptoms) -Controlled -Current treatment   Omeprazole 40mg , 1 capsule daily   Sodium bicarbonate, 1 tablet daily as needed (with morning coffee) -Medications previously tried: Nexium, tums  -Recommended to continue current medication Counseled on non-pharmacologic management of symptoms such as elevating the head of your bed, avoiding eating 2-3 hours before bed, avoiding triggering foods such as acidic, spicy, or fatty foods, eating smaller meals, and wearing clothes that are loose around the waist Recommended moving omeprazole to before meals to improve how well it works   Osteopenia (Goal improve bone density and prevent fractures) -Controlled -Last DEXA Scan: 12/29/16   T-Score femoral neck: -2.1  T-Score total hip: n/a  T-Score lumbar spine: -0.4  T-Score forearm radius: n/a  10-year probability of major osteoporotic fracture: n/a  10-year probability of hip fracture: n/a -Patient is not a candidate for pharmacologic treatment -Current treatment   calcium carbonate/ vitamin D, 1200mg , 1 tablet daily  cholecalciferol 1,000 units daily -Medications previously tried: none  -Recommend [phone] units of vitamin D daily. Recommend 1200 mg of calcium daily from dietary and supplemental sources. Recommend weight-bearing and muscle strengthening exercises for building and maintaining bone density. -Recommended to continue current medication Recommended repeat DEXA (bone density scan)   Hypothyroidism (Goal: TSH 0.35-4.5) -Controlled -Current treatment  . NP thyroid 60mg , 1 tablet daily  -Medications previously tried: none  -Recommended to continue current medication  Health Maintenance -Vaccine gaps: none -Current therapy:   Coenzyme Q10 100 mg daily  Magnesium 450 mg 1 tablet at bedtime  Multivitamin 1 tablet daily   -Educated on Cost vs benefit of each product must be carefully weighed by individual consumer -Patient is satisfied with current therapy and denies issues -Recommended to continue current medication  Patient Goals/Self-Care Activities . Patient will:  - take medications as prescribed check blood pressure at least weekly, document, and provide at future appointments target a minimum of 150 minutes of moderate intensity exercise weekly  Follow Up Plan: Telephone follow up appointment with care management team member scheduled for: 6 months       Medication Assistance: None required.  Patient affirms current coverage meets needs.  Patient's preferred pharmacy is:  [id number] [place], [place] - [address] [place] [address] Phone: [phone] Fax: [phone]  [facility] - [place], [place] - [address] [place] [address]  Phone: [phone] Fax: [phone]  Uses pill box? Yes Pt endorses 100% compliance  We discussed: Benefits of medication synchronization, packaging and delivery as well as enhanced pharmacist oversight with [facility]. Patient decided to: Utilize [facility] pharmacy for medication synchronization, packaging and delivery  Care Plan and Follow Up Patient Decision:  Patient agrees to Care Plan and Follow-up.  Plan: Telephone follow up appointment with care management team member scheduled for:  6 months  Jeni Salles, PharmD [facility] Clinical Pharmacist [place] [facility] [place] [phone]  6 months - 10 am, no preference on day

## 2020-11-26 NOTE — Patient Instructions (Addendum)
Hi Whitney Evans,  It was great to get to speak with you over the telephone again! Below is a summary of some of the topics we discussed. I think you may find it helpful to move the omeprazole to 30 minutes before a meal as this may provide additional relief.  Keep up the good work with taking your medications and checking your blood pressure regularly as well as working on your diet changes!  Please reach out to me if you have any questions or need anything before our follow up!  Best, Maddie  Gaylord Shih, PharmD, [facility] Clinical Pharmacist [facility] [place] [phone]  Visit Information  Goals Addressed   None    Patient Care Plan: CCM Pharmacy Care Plan    Problem Identified: Problem: Hypertension, Hyperlipidemia, GERD, Asthma, Hypothyroidism and Osteopenia     Long-Range Goal: Patient-Specific Goal   Start Date: 11/19/2020  Expected End Date: 11/19/2021  This Visit's Progress: On track  Priority: High  Note:   Current Barriers:  . Unable to independently monitor therapeutic efficacy  Pharmacist Clinical Goal(s):  Marland Kitchen Patient will achieve adherence to monitoring guidelines and medication adherence to achieve therapeutic efficacy . achieve control of cholesterol as evidenced by next lipid panel  through collaboration with PharmD and provider.   Interventions: . 1:1 collaboration with Nelwyn Salisbury, MD regarding development and update of comprehensive plan of care as evidenced by provider attestation and co-signature . Inter-disciplinary care team collaboration (see longitudinal plan of care) . Comprehensive medication review performed; medication list updated in electronic medical record  Hypertension (BP goal <140/90) -Controlled -Current treatment:  amlodipine 5mg ,1 tablet daily  metoprolol succinate 100mg , 1 tablet daily -Medications previously tried: lisinopril, losartan -Current home readings: 120/60-65s (couple times of day) -Current dietary habits:  patient reports cooking at home all the time and making sure to add salt in moderation to food -Current exercise habits: walking around yard and house (on nice days) -Denies hypotensive/hypertensive symptoms -Educated on Exercise goal of 150 minutes per week; Importance of home blood pressure monitoring; -Counseled to monitor BP at home at least weekly, document, and provide log at future appointments -Recommended to continue current medication  Hyperlipidemia: (LDL goal < 100) -Uncontrolled -Current treatment: . omega-3 fatty acids 1 capsule daily  -Medications previously tried: atorvastatin (myalgias)  -Current dietary patterns: eating more fiber in her diet; using an air fryer to fry foods -Current exercise habits: walking (on nice days) -Educated on Cholesterol goals;  Importance of limiting foods high in cholesterol; Exercise goal of 150 minutes per week; Benefits of Zetia for LDL lowering and ASCVD prevention -Counseled on diet and exercise extensively Patient prefers to continue with diet and exercise modifications prior to trying another medication  Asthma (Goal: control symptoms) -Controlled -Current treatment  . Symbicort 80-4.5 mcg/act inhaler inhale 2 puffs twice daily  Ipratropium/albuterol 0.5/2.5mg / 68ml, 1 vial via nebulizer every four hours as needed  montelukast 10mg , 1 tablet daily   albuterol (Proair HFA), 2 puffs every four hours as needed  -Medications previously tried: Qvar  -Pulmonary function testing: n/a -Exacerbations requiring treatment in last 6 months: none -Patient reports consistent use of maintenance inhaler -Frequency of rescue inhaler use: 3-4 times a week -Counseled on Benefits of consistent maintenance inhaler use When to use rescue inhaler Differences between maintenance and rescue inhalers -Recommended to continue current medication May need to consider dose increase of Symbicort for better symptom control  GERD (Goal: minimize  symptoms) -Controlled -Current treatment   Omeprazole 40mg , 1  capsule daily   Sodium bicarbonate, 1 tablet daily as needed (with morning coffee) -Medications previously tried: Nexium, tums  -Recommended to continue current medication Counseled on non-pharmacologic management of symptoms such as elevating the head of your bed, avoiding eating 2-3 hours before bed, avoiding triggering foods such as acidic, spicy, or fatty foods, eating smaller meals, and wearing clothes that are loose around the waist Recommended moving omeprazole to before meals to improve how well it works   Osteopenia (Goal improve bone density and prevent fractures) -Controlled -Last DEXA Scan: 12/29/16   T-Score femoral neck: -2.1  T-Score total hip: n/a  T-Score lumbar spine: -0.4  T-Score forearm radius: n/a  10-year probability of major osteoporotic fracture: n/a  10-year probability of hip fracture: n/a -Patient is not a candidate for pharmacologic treatment -Current treatment   calcium carbonate/ vitamin D, 1200mg , 1 tablet daily  cholecalciferol 1,000 units daily -Medications previously tried: none  -Recommend [phone] units of vitamin D daily. Recommend 1200 mg of calcium daily from dietary and supplemental sources. Recommend weight-bearing and muscle strengthening exercises for building and maintaining bone density. -Recommended to continue current medication Recommended repeat DEXA (bone density scan)   Hypothyroidism (Goal: TSH 0.35-4.5) -Controlled -Current treatment  . NP thyroid 60mg , 1 tablet daily  -Medications previously tried: none  -Recommended to continue current medication  Health Maintenance -Vaccine gaps: none -Current therapy:   Coenzyme Q10 100 mg daily  Magnesium 450 mg 1 tablet at bedtime  Multivitamin 1 tablet daily  -Educated on Cost vs benefit of each product must be carefully weighed by individual consumer -Patient is satisfied with current therapy and denies  issues -Recommended to continue current medication  Patient Goals/Self-Care Activities . Patient will:  - take medications as prescribed check blood pressure at least weekly, document, and provide at future appointments target a minimum of 150 minutes of moderate intensity exercise weekly  Follow Up Plan: Telephone follow up appointment with care management team member scheduled for: 6 months       Patient verbalizes understanding of instructions provided today and agrees to view in MyChart.  Telephone follow up appointment with pharmacy team member scheduled for:  , [facility]  Dyslipidemia Dyslipidemia is an imbalance of waxy, fat-like substances (lipids) in the blood. The body needs lipids in small amounts. Dyslipidemia often involves a high level of cholesterol or triglycerides, which are types of lipids. Common forms of dyslipidemia include:  High levels of LDL cholesterol. LDL is the type of cholesterol that causes fatty deposits (plaques) to build up in the blood vessels that carry blood away from your heart (arteries).  Low levels of HDL cholesterol. HDL cholesterol is the type of cholesterol that protects against heart disease. High levels of HDL remove the LDL buildup from arteries.  High levels of triglycerides. Triglycerides are a fatty substance in the blood that is linked to a buildup of plaques in the arteries. What are the causes? Primary dyslipidemia is caused by changes (mutations) in genes that are passed down through families (inherited). These mutations cause several types of dyslipidemia. Secondary dyslipidemia is caused by lifestyle choices and diseases that lead to dyslipidemia, such as:  Eating a diet that is high in animal fat.  Not getting enough exercise.  Having diabetes, kidney disease, liver disease, or thyroid disease.  Drinking large amounts of alcohol.  Using certain medicines. What increases the risk? You are more likely to develop  this condition if you are an older man or if you  are a woman who has gone through menopause. Other risk factors include:  Having a family history of dyslipidemia.  Taking certain medicines, including birth control pills, steroids, some diuretics, and beta-blockers.  Smoking cigarettes.  Eating a high-fat diet.  Having certain medical conditions such as diabetes, polycystic ovary syndrome (PCOS), kidney disease, liver disease, or hypothyroidism.  Not exercising regularly.  Being overweight or obese with too much belly fat. What are the signs or symptoms? In most cases, dyslipidemia does not usually cause any symptoms. In severe cases, very high lipid levels can cause:  Fatty bumps under the skin (xanthomas).  White or gray ring around the black center (pupil) of the eye. Very high triglyceride levels can cause inflammation of the pancreas (pancreatitis). How is this diagnosed? Your health care provider may diagnose dyslipidemia based on a routine blood test (fasting blood test). Because most people do not have symptoms of the condition, this blood testing (lipid profile) is done on adults age 73 and older and is repeated every 5 years. This test checks:  Total cholesterol. This measures the total amount of cholesterol in your blood, including LDL cholesterol, HDL cholesterol, and triglycerides. A healthy number is below 200.  LDL cholesterol. The target number for LDL cholesterol is different for each person, depending on individual risk factors. Ask your health care provider what your LDL cholesterol should be.  HDL cholesterol. An HDL level of 60 or higher is best because it helps to protect against heart disease. A number below 40 for men or below 50 for women increases the risk for heart disease.  Triglycerides. A healthy triglyceride number is below 150. If your lipid profile is abnormal, your health care provider may do other blood tests.   How is this treated? Treatment  depends on the type of dyslipidemia that you have and your other risk factors for heart disease and stroke. Your health care provider will have a target range for your lipid levels based on this information. For many people, this condition may be treated by lifestyle changes, such as diet and exercise. Your health care provider may recommend that you:  Get regular exercise.  Make changes to your diet.  Quit smoking if you smoke. If diet changes and exercise do not help you reach your goals, your health care provider may also prescribe medicine to lower lipids. The most commonly prescribed type of medicine lowers your LDL cholesterol (statin drug). If you have a high triglyceride level, your provider may prescribe another type of drug (fibrate) or an omega-3 fish oil supplement, or both. Follow these instructions at home: Eating and drinking  Follow instructions from your health care provider or dietitian about eating or drinking restrictions.  Eat a healthy diet as told by your health care provider. This can help you reach and maintain a healthy weight, lower your LDL cholesterol, and raise your HDL cholesterol. This may include: ? Limiting your calories, if you are overweight. ? Eating more fruits, vegetables, whole grains, fish, and lean meats. ? Limiting saturated fat, trans fat, and cholesterol.  If you drink alcohol: ? Limit how much you use. ? Be aware of how much alcohol is in your drink. In the [place] one drink equals one 12 oz bottle of beer (355 mL), one 5 oz glass of wine (148 mL), or one 1 oz glass of hard liquor (44 mL).  Do not drink alcohol if: ? Your health care provider tells you not to drink. ? You are pregnant, may  be pregnant, or are planning to become pregnant. Activity  Get regular exercise. Start an exercise and strength training program as told by your health care provider. Ask your health care provider what activities are safe for you. Your health care provider  may recommend: ? 30 minutes of aerobic activity 4-6 days a week. Brisk walking is an example of aerobic activity. ? Strength training 2 days a week. General instructions  Do not use any products that contain nicotine or tobacco, such as cigarettes, e-cigarettes, and chewing tobacco. If you need help quitting, ask your health care provider.  Take over-the-counter and prescription medicines only as told by your health care provider. This includes supplements.  Keep all follow-up visits as told by your health care provider.   Contact a health care provider if:  You are: ? Having trouble sticking to your exercise or diet plan. ? Struggling to quit smoking or control your use of alcohol. Summary  Dyslipidemia often involves a high level of cholesterol or triglycerides, which are types of lipids.  Treatment depends on the type of dyslipidemia that you have and your other risk factors for heart disease and stroke.  For many people, treatment starts with lifestyle changes, such as diet and exercise.  Your health care provider may prescribe medicine to lower lipids. This information is not intended to replace advice given to you by your health care provider. Make sure you discuss any questions you have with your health care provider. Document Revised: 04/18/2018 Document Reviewed: 03/25/2018 Elsevier Patient Education  2021 [employer].

## 2020-12-11 ENCOUNTER — Ambulatory Visit (INDEPENDENT_AMBULATORY_CARE_PROVIDER_SITE_OTHER): Payer: PPO

## 2020-12-11 DIAGNOSIS — I442 Atrioventricular block, complete: Secondary | ICD-10-CM

## 2020-12-12 ENCOUNTER — Telehealth: Payer: Self-pay | Admitting: Pharmacist

## 2020-12-12 LAB — CUP PACEART REMOTE DEVICE CHECK
Battery Remaining Longevity: 113 mo
Battery Remaining Percentage: 95.5 %
Battery Voltage: 2.99 V
Brady Statistic AP VP Percent: 8 %
Brady Statistic AP VS Percent: 21 %
Brady Statistic AS VP Percent: 50 %
Brady Statistic AS VS Percent: 21 %
Brady Statistic RA Percent Paced: 28 %
Brady Statistic RV Percent Paced: 58 %
Date Time Interrogation Session: 20220406020013
Implantable Lead Implant Date: 20171012
Implantable Lead Implant Date: 20171012
Implantable Lead Location: 753859
Implantable Lead Location: 753860
Implantable Pulse Generator Implant Date: 20171012
Lead Channel Impedance Value: 430 Ohm
Lead Channel Impedance Value: 550 Ohm
Lead Channel Pacing Threshold Amplitude: 0.5 V
Lead Channel Pacing Threshold Amplitude: 0.5 V
Lead Channel Pacing Threshold Pulse Width: 0.5 ms
Lead Channel Pacing Threshold Pulse Width: 0.5 ms
Lead Channel Sensing Intrinsic Amplitude: 2.2 mV
Lead Channel Sensing Intrinsic Amplitude: 5 mV
Lead Channel Setting Pacing Amplitude: 2 V
Lead Channel Setting Pacing Amplitude: 2.5 V
Lead Channel Setting Pacing Pulse Width: 0.5 ms
Lead Channel Setting Sensing Sensitivity: 2 mV
Pulse Gen Model: 2272
Pulse Gen Serial Number: 7955201

## 2020-12-12 NOTE — Chronic Care Management (AMB) (Signed)
Chronic Care Management Pharmacy Assistant   Name: Whitney Evans  MRN: [id number] DOB: 01/04/1946  Reason for Encounter: Medication Review-Medication Coordination Call  Recent office visits:  None  Recent consult visits:  None  Hospital visits:  None in previous 6 months  Medications: Outpatient Encounter Medications as of 12/12/2020  Medication Sig  . albuterol (PROAIR HFA) 108 (90 Base) MCG/ACT inhaler TAKE 2 PUFFS BY MOUTH EVERY 4 HOURS AS NEEDED  . amLODipine (NORVASC) 5 MG tablet Take 1 tablet (5 mg total) by mouth daily.  Marland Kitchen BLACK PEPPER-TURMERIC PO Take by mouth.  . budesonide-formoterol (SYMBICORT) 80-4.5 MCG/ACT inhaler Inhale 2 puffs into the lungs 2 (two) times daily.  . Calcium Carbonate-Vit D-Min (CALCIUM 1200 PO) Take 1,200 mg by mouth daily.  . Cholecalciferol 25 MCG (1000 UT) tablet Take 1,000 Units by mouth daily.  . Coenzyme Q10 (CO Q 10 PO) Take 100 mg by mouth daily.   Marland Kitchen FLUZONE HIGH-DOSE QUADRIVALENT 0.7 ML SUSY   . ipratropium-albuterol (DUONEB) 0.5-2.5 (3) MG/3ML SOLN Take 3 mLs by nebulization every 4 (four) hours as needed (asthma). (Patient not taking: Reported on 08/22/2020)  . MAGNESIUM PO Take 450 mg by mouth at bedtime.   . metoprolol succinate (TOPROL-XL) 100 MG 24 hr tablet Take 1 tablet (100 mg total) by mouth daily. Take with or immediately following a meal.  . montelukast (SINGULAIR) 10 MG tablet Take 1 tablet (10 mg total) by mouth daily.  . Multiple Vitamin (MULTIVITAMIN WITH MINERALS) TABS tablet Take 1 tablet by mouth daily.  . NP THYROID 60 MG tablet Take 1 tablet (60 mg total) by mouth daily.  . Omega-3 Fatty Acids (FISH OIL PO) Take 1,300 mg by mouth daily.   Marland Kitchen omeprazole (PRILOSEC) 40 MG capsule TAKE 1 CAPSULE BY MOUTH EVERY DAY   No facility-administered encounter medications on file as of 12/12/2020.   Reviewed chart for medication changes ahead of medication coordination call.  BP Readings from Last 3 Encounters:  03/06/20  126/62  02/15/20 136/70  08/21/19 106/67    Lab Results  Component Value Date   HGBA1C 5.8 11/28/2009    Patient obtains medications through Vials  90 Days  Last adherence delivery included:   Budesonide-formoterol (SYMBICORT) 80-4.5 MCG/ACT inhaler Np Thyroid 60 mg daily  Metoprolol Succinate ER 100 mg: one tablet daily  Omeprazole 40 mg: one tablet daily  Montelukast 10 mg: one tablet daily  Amlodipine 5 mg: one tablet daily  NP THYROID 60 mg: one tablet daily . Ipratropium-albuterol (DUONEB) 0.5-2.5 (3) MG/3ML SOLN  . Albuterol (PROAIR HFA) 108 (90 Base) MCG/ACT inhaler  Patient is due for next adherence delivery on: 12/24/2020. Called patient and reviewed medications and coordinated delivery. This delivery to include:  Budesonide-formoterol (SYMBICORT) 80-4.5 MCG/ACT inhaler Np Thyroid 60 mg daily  Metoprolol Succinate ER 100 mg: one tablet daily  Omeprazole 40 mg: one tablet daily  Montelukast 10 mg: one tablet daily  Amlodipine 5 mg: one tablet daily  NP THYROID 60 mg: one tablet daily  Patient declined the following medications due to PRN use. Marland Kitchen Ipratropium-albuterol (DUONEB) 0.5-2.5 (3) MG/3ML SOLN  . Albuterol (PROAIR HFA) 108 (90 Base) MCG/ACT inhaler  Patient needs refills for: . Budesonide-formoterol (SYMBICORT) 80-4.5 MCG/ACT inhaler Np Thyroid 60 mg daily . Metoprolol Succinate ER 100 mg: one tablet daily . NP THYROID 60 mg: one tablet daily  Confirmed delivery date of 12/23/2020, advised patient that pharmacy will contact them the morning of delivery.  Whitney Evans,  CMA [facility] [phone]

## 2020-12-23 ENCOUNTER — Other Ambulatory Visit: Payer: Self-pay | Admitting: Family Medicine

## 2020-12-24 NOTE — Progress Notes (Signed)
Remote pacemaker transmission.   

## 2020-12-31 ENCOUNTER — Other Ambulatory Visit: Payer: Self-pay | Admitting: Family Medicine

## 2020-12-31 DIAGNOSIS — E2839 Other primary ovarian failure: Secondary | ICD-10-CM

## 2021-01-03 ENCOUNTER — Other Ambulatory Visit: Payer: Self-pay

## 2021-01-03 ENCOUNTER — Ambulatory Visit
Admission: RE | Admit: 2021-01-03 | Discharge: 2021-01-03 | Disposition: A | Discharge status: Home / Self Care | Payer: PPO | Source: Ambulatory Visit | Attending: Family Medicine | Admitting: Family Medicine

## 2021-01-03 DIAGNOSIS — E2839 Other primary ovarian failure: Secondary | ICD-10-CM

## 2021-02-26 ENCOUNTER — Telehealth: Payer: Self-pay | Admitting: Pharmacist

## 2021-02-26 NOTE — Chronic Care Management (AMB) (Signed)
    Chronic Care Management Pharmacy Assistant   Name: Whitney Evans  MRN: [id number] DOB: 1945-11-09   Reason for Encounter: Disease State/ Hyperlipidemia Assessment Call.    Conditions to be addressed/monitored: HLD  Recent office visits:  None.   Recent consult visits:  None.   Hospital visits:  None in previous 6 months  Medications: Outpatient Encounter Medications as of 02/26/2021  Medication Sig   albuterol (PROAIR HFA) 108 (90 Base) MCG/ACT inhaler TAKE 2 PUFFS BY MOUTH EVERY 4 HOURS AS NEEDED   amLODipine (NORVASC) 5 MG tablet Take 1 tablet (5 mg total) by mouth daily.   BLACK PEPPER-TURMERIC PO Take by mouth.   budesonide-formoterol (SYMBICORT) 80-4.5 MCG/ACT inhaler INHALE TWO PUFFS into THE lungs TWICE DAILY   Calcium Carbonate-Vit D-Min (CALCIUM 1200 PO) Take 1,200 mg by mouth daily.   Cholecalciferol 25 MCG (1000 UT) tablet Take 1,000 Units by mouth daily.   Coenzyme Q10 (CO Q 10 PO) Take 100 mg by mouth daily.    FLUZONE HIGH-DOSE QUADRIVALENT 0.7 ML SUSY    ipratropium-albuterol (DUONEB) 0.5-2.5 (3) MG/3ML SOLN Take 3 mLs by nebulization every 4 (four) hours as needed (asthma). (Patient not taking: Reported on 08/22/2020)   MAGNESIUM PO Take 450 mg by mouth at bedtime.    metoprolol succinate (TOPROL-XL) 100 MG 24 hr tablet Take 1 tablet (100 mg total) by mouth daily. Take with or immediately following a meal.   montelukast (SINGULAIR) 10 MG tablet Take 1 tablet (10 mg total) by mouth daily.   Multiple Vitamin (MULTIVITAMIN WITH MINERALS) TABS tablet Take 1 tablet by mouth daily.   NP THYROID 60 MG tablet TAKE ONE TABLET BY MOUTH DAILY   Omega-3 Fatty Acids (FISH OIL PO) Take 1,300 mg by mouth daily.    omeprazole (PRILOSEC) 40 MG capsule TAKE 1 CAPSULE BY MOUTH EVERY DAY   No facility-administered encounter medications on file as of 02/26/2021.   Comprehensive medication review performed; Spoke to patient regarding cholesterol  Lipid Panel     Component Value Date/Time   CHOL 250 (H) 03/06/2020 0959   TRIG 172.0 (H) 03/06/2020 0959   HDL 63.10 03/06/2020 0959   LDLCALC 152 (H) 03/06/2020 0959   LDLDIRECT 173.6 12/03/2011 0948    10-year ASCVD risk score: The ASCVD Risk score Denman George DC Jr., et al., 2013) failed to calculate for the following reasons:   The systolic blood pressure is missing  Current antihyperlipidemic regimen:  omega-3 fatty acids 1 capsule daily Previous antihyperlipidemic medications tried: atorvastatin (myalgias)  ASCVD risk enhancing conditions: age >45 and HTN What recent interventions/DTPs have been made by any provider to improve Cholesterol control since last CPP Visit: None.  Any recent hospitalizations or ED visits since last visit with CPP? No What diet changes have been made to improve Cholesterol?   What exercise is being done to improve Cholesterol?    Adherence Review: Does the patient have >5 day gap between last estimated fill dates? No  Multiple unsuccessful attempts to reach patient by phone.   Star Rating Drugs:  None.   Joycelyn Das CMA  Clinical Pharmacist Assistant [phone]

## 2021-02-28 NOTE — Telephone Encounter (Cosign Needed)
2nd attempt

## 2021-03-03 NOTE — Telephone Encounter (Cosign Needed)
3rd attempt

## 2021-03-12 ENCOUNTER — Ambulatory Visit (INDEPENDENT_AMBULATORY_CARE_PROVIDER_SITE_OTHER): Payer: PPO

## 2021-03-12 DIAGNOSIS — I442 Atrioventricular block, complete: Secondary | ICD-10-CM

## 2021-03-13 ENCOUNTER — Other Ambulatory Visit: Payer: Self-pay | Admitting: Family Medicine

## 2021-03-13 LAB — CUP PACEART REMOTE DEVICE CHECK
Battery Remaining Longevity: 65 mo
Battery Remaining Percentage: 59 %
Battery Voltage: 2.99 V
Brady Statistic AP VP Percent: 6.2 %
Brady Statistic AP VS Percent: 23 %
Brady Statistic AS VP Percent: 43 %
Brady Statistic AS VS Percent: 28 %
Brady Statistic RA Percent Paced: 28 %
Brady Statistic RV Percent Paced: 49 %
Date Time Interrogation Session: 20220706234526
Implantable Lead Implant Date: 20171012
Implantable Lead Implant Date: 20171012
Implantable Lead Location: 753859
Implantable Lead Location: 753860
Implantable Pulse Generator Implant Date: 20171012
Lead Channel Impedance Value: 410 Ohm
Lead Channel Impedance Value: 540 Ohm
Lead Channel Pacing Threshold Amplitude: 0.5 V
Lead Channel Pacing Threshold Amplitude: 0.5 V
Lead Channel Pacing Threshold Pulse Width: 0.5 ms
Lead Channel Pacing Threshold Pulse Width: 0.5 ms
Lead Channel Sensing Intrinsic Amplitude: 1.6 mV
Lead Channel Sensing Intrinsic Amplitude: 4.6 mV
Lead Channel Setting Pacing Amplitude: 2 V
Lead Channel Setting Pacing Amplitude: 2.5 V
Lead Channel Setting Pacing Pulse Width: 0.5 ms
Lead Channel Setting Sensing Sensitivity: 2 mV
Pulse Gen Model: 2272
Pulse Gen Serial Number: 7955201

## 2021-03-14 ENCOUNTER — Telehealth: Payer: Self-pay | Admitting: Pharmacist

## 2021-03-14 NOTE — Chronic Care Management (AMB) (Signed)
Chronic Care Management Pharmacy Assistant   Name: Whitney Evans  MRN: [id number] DOB: 09-Feb-1946  Reason for Encounter: Medication Review-Medication Coordination Call  Recent office visits:  None  Recent consult visits:  None  Hospital visits:  None in previous 6 months  Medications: Outpatient Encounter Medications as of 03/14/2021  Medication Sig   albuterol (PROAIR HFA) 108 (90 Base) MCG/ACT inhaler TAKE 2 PUFFS BY MOUTH EVERY 4 HOURS AS NEEDED   amLODipine (NORVASC) 5 MG tablet Take 1 tablet (5 mg total) by mouth daily.   BLACK PEPPER-TURMERIC PO Take by mouth.   budesonide-formoterol (SYMBICORT) 80-4.5 MCG/ACT inhaler INHALE TWO PUFFS into THE lungs TWICE DAILY   Calcium Carbonate-Vit D-Min (CALCIUM 1200 PO) Take 1,200 mg by mouth daily.   Cholecalciferol 25 MCG (1000 UT) tablet Take 1,000 Units by mouth daily.   Coenzyme Q10 (CO Q 10 PO) Take 100 mg by mouth daily.    FLUZONE HIGH-DOSE QUADRIVALENT 0.7 ML SUSY    ipratropium-albuterol (DUONEB) 0.5-2.5 (3) MG/3ML SOLN Take 3 mLs by nebulization every 4 (four) hours as needed (asthma). (Patient not taking: Reported on 08/22/2020)   MAGNESIUM PO Take 450 mg by mouth at bedtime.    metoprolol succinate (TOPROL-XL) 100 MG 24 hr tablet TAKE ONE TABLET BY MOUTH ONCE DAILY WITH OR immediately following A meal   montelukast (SINGULAIR) 10 MG tablet Take 1 tablet (10 mg total) by mouth daily.   Multiple Vitamin (MULTIVITAMIN WITH MINERALS) TABS tablet Take 1 tablet by mouth daily.   NP THYROID 60 MG tablet TAKE ONE TABLET BY MOUTH DAILY   Omega-3 Fatty Acids (FISH OIL PO) Take 1,300 mg by mouth daily.    omeprazole (PRILOSEC) 40 MG capsule TAKE 1 CAPSULE BY MOUTH EVERY DAY   No facility-administered encounter medications on file as of 03/14/2021.   Reviewed chart for medication changes ahead of medication coordination call. No OVs, Consults, or hospital visits since last care coordination call/Pharmacist visit. No  medication changes indicated.  BP Readings from Last 3 Encounters:  03/06/20 126/62  02/15/20 136/70  08/21/19 106/67    Lab Results  Component Value Date   HGBA1C 5.8 11/28/2009    Patient obtains medications through Vials  90 Days  Last adherence delivery included:  Metoprolol succinate (TOPROL-XL) 100 MG 24 hr- one tablet daily NP THYROID 60 mg: one tablet daily Budesonide-formoterol (SYMBICORT) 80-4.5 MCG/ACT inhaler: two puffs into lungs twice daily Omeprazole (PRILOSEC) 40 mg: one capsule daily Montelukast (SINGULAIR) 10 mg: one tablet daily Amlodipine (NORVASC) 5 mg: one tablet daily  Patient declined the following medication last month due to PRN use/additional supply on hand. Albuterol (PROAIR HFA) 108 (90 Base) MCG/ACT inhaler: one puff by mouth every 4 hours as needed Ipratropium-albuterol (DUONEB) 0.5-2.5 (3) MG/3ML SOLN  Patient is due for next adherence delivery on: 07.14.2022. Called patient and reviewed medications and coordinated delivery. This delivery to include: Metoprolol succinate (TOPROL-XL) 100 MG 24 hr- one tablet daily NP THYROID 60 mg: one tablet daily Budesonide-formoterol (SYMBICORT) 80-4.5 MCG/ACT inhaler: two puffs into lungs twice daily Omeprazole (PRILOSEC) 40 mg: one capsule daily Montelukast (SINGULAIR) 10 mg: one tablet daily Amlodipine (NORVASC) 5 mg: one tablet daily  Patient declined the following medications due to PRN Use. Albuterol (PROAIR HFA) 108 (90 Base) MCG/ACT inhaler: one puff by mouth every 4 hours as needed Ipratropium-albuterol (DUONEB) 0.5-2.5 (3) MG/3ML SOLN  The patient does not currently need refills. Confirmed delivery date of 07.14.2022, advised patient that pharmacy will contact  them the morning of delivery.  Care Gaps: Colonoscopy  Mammogram Covid -19  Star Rating Drugs: None  Berenice Bouton, [facility] Clinical Pharmacist Assistant [phone]

## 2021-04-03 NOTE — Progress Notes (Signed)
Remote pacemaker transmission.   

## 2021-05-23 ENCOUNTER — Telehealth: Payer: Self-pay | Admitting: Pharmacist

## 2021-05-23 NOTE — Chronic Care Management (AMB) (Signed)
    Chronic Care Management Pharmacy Assistant   Name: AMARISS DETAMORE  MRN: [id number] DOB: [birth year]  05/26/21 APPOINTMENT REMINDER   Called [name], No answer, left message of appointment on 05/26/21 at 10am via telephone visit with Gaylord Shih, Pharm D. Notified to have all medications, supplements, blood pressure and/or blood sugar logs available during appointment and to return call if need to reschedule.  Care Gaps:  AWV - completed on 08/22/20 Colonoscopy - overedue since 2018 Mammogram - overdue since 12/25/18 Covid-19 vaccine booster 4 - overdue since 12/01/20 Flu vaccine - due  Star Rating Drug:  None.  Any gaps in medications fill history? N/A  Joycelyn Das CMA  Clinical Pharmacist Assistant [phone]

## 2021-05-26 ENCOUNTER — Ambulatory Visit (INDEPENDENT_AMBULATORY_CARE_PROVIDER_SITE_OTHER): Payer: PPO | Admitting: Pharmacist

## 2021-05-26 DIAGNOSIS — E785 Hyperlipidemia, unspecified: Secondary | ICD-10-CM

## 2021-05-26 DIAGNOSIS — I10 Essential (primary) hypertension: Secondary | ICD-10-CM

## 2021-05-26 NOTE — Progress Notes (Signed)
Chronic Care Management Pharmacy Note  06/06/2021 Name:  Whitney Evans MRN:  [id number] DOB:  [birth year]  Summary: LDL is not at goal < 100 TSH is not ideal given osteopenia diagnosis   Recommendations/Changes made from today's visit: -Recommended for patient to call [facility] to schedule her DEXA scan -Recommend using Symbicort for rescue and maintenance inhaler -Depending on result of next TSH level, recommend decreasing Armor thyroid dose to target TSH of 2.5-4.5   Plan: Follow up after repeat lipid panel and DEXA scan results  Subjective: Whitney Evans is an 75 y.o. year old female who is a primary patient of Whitney Morale, MD.  The CCM team was consulted for assistance with disease management and care coordination needs.    Engaged with patient by telephone for follow up visit in response to provider referral for pharmacy case management and/or care coordination services.   Consent to Services:  The patient was given information about Chronic Care Management services, agreed to services, and gave verbal consent prior to initiation of services.  Please see initial visit note for detailed documentation.   Patient Care Team: Whitney Morale, MD as PCP - General (Family Medicine) Constance Haw, MD as PCP - Electrophysiology (Cardiology) Viona Gilmore, [facility] as Pharmacist (Pharmacist)  Recent office visits: 08/22/20 [name], LPN: Patient presented for AWV.  03/06/20 Dr. Sarajane Jews - Patient presented for chronic care office visit. BP has been good in the mornings but increases by around 7pm every day. Metoprolol increased to 100 mg in the morning.  Recent consult visits: 09/11/20 Roma Kayser, RN (Cardiology): Patient presented for remote device check.  06/12/20 Roma Kayser, RN (Cardiology): Patient presented for remote device check.  03/14/20 - Tyson Dense (neuro ophthalmology) - Patient presented to discuss abnormal findings from CT on 02/14/20. Follow  up in 6 months.   02/15/2020- Cardiologist- Patient presented to Dr. Allegra Lai, MD for AV block. Pt denies symptoms of palpitations, chest pain, shortness of breath, orthopnea, etc. Vitamin D supplementation decreased to 1,000 units daily.  02/01/20 - Darleen Crocker (ophthalmology) - Patient presented for follow up for cataracts. Unable to access notes.  Hospital visits: None in previous 6 months  Objective:  Lab Results  Component Value Date   CREATININE 0.87 03/06/2020   BUN 18 03/06/2020   GFR 64.03 03/06/2020   GFRNONAA 65 06/16/2017   GFRAA 74 06/16/2017   NA 139 03/06/2020   K 5.0 03/06/2020   CALCIUM 9.9 03/06/2020   CO2 28 03/06/2020    Lab Results  Component Value Date/Time   HGBA1C 5.8 11/28/2009 12:00 AM   GFR 64.03 03/06/2020 09:59 AM   GFR 65.89 10/21/2017 09:07 AM   MICROALBUR 1.1 11/28/2009 12:00 AM    Last diabetic Eye exam: No results found for: HMDIABEYEEXA  Last diabetic Foot exam: No results found for: HMDIABFOOTEX   Lab Results  Component Value Date   CHOL 250 (H) 03/06/2020   HDL 63.10 03/06/2020   LDLCALC 152 (H) 03/06/2020   LDLDIRECT 173.6 12/03/2011   TRIG 172.0 (H) 03/06/2020   CHOLHDL 4 03/06/2020    Hepatic Function Latest Ref Rng & Units 03/06/2020 10/21/2017 09/23/2015  Total Protein 6.0 - 8.3 g/dL 7.1 7.1 7.5  Albumin 3.5 - 5.2 g/dL 4.3 4.1 4.2  AST 0 - 37 U/L $Remo'14 14 16  'ioEty$ ALT 0 - 35 U/L $Remo'19 17 16  'QcxNX$ Alk Phosphatase 39 - 117 U/L 77 65 66  Total Bilirubin 0.2 - 1.2 mg/dL 0.4  0.4 0.4  Bilirubin, Direct 0.0 - 0.3 mg/dL 0.1 0.1 0.1    Lab Results  Component Value Date/Time   TSH 1.84 03/06/2020 09:59 AM   TSH 2.71 10/21/2017 09:07 AM   FREET4 0.70 03/06/2020 09:59 AM   FREET4 1.10 01/12/2012 02:51 PM    CBC Latest Ref Rng & Units 03/06/2020 10/21/2017 06/17/2016  WBC 4.0 - 10.5 K/uL 9.7 6.7 16.3(H)  Hemoglobin 12.0 - 15.0 g/dL 56.4 62.9 00.9  Hematocrit 36.0 - 46.0 % 40.8 42.5 37.6  Platelets 150.0 - 400.0 K/uL 368.0 361.0 322     No results found for: VD25OH  Clinical ASCVD: No  The ASCVD Risk score (Arnett DK, et al., 2019) failed to calculate for the following reasons:   The systolic blood pressure is missing    Depression screen [facility] 2/9 08/22/2020 08/21/2019 03/25/2017  Decreased Interest 0 1 0  Down, Depressed, Hopeless 0 1 1  PHQ - 2 Score 0 2 1  Altered sleeping 0 1 -  Tired, decreased energy 0 0 -  Change in appetite 0 0 -  Feeling bad or failure about yourself  0 0 -  Trouble concentrating 0 0 -  Moving slowly or fidgety/restless 0 0 -  Suicidal thoughts - 0 -  PHQ-9 Score 0 3 -  Difficult doing work/chores Not difficult at all Not difficult at all -     No flowsheet data found.  No flowsheet data found.   Social History   Tobacco Use  Smoking Status Former  Smokeless Tobacco Never   BP Readings from Last 3 Encounters:  03/06/20 126/62  02/15/20 136/70  08/21/19 106/67   Pulse Readings from Last 3 Encounters:  03/06/20 71  02/15/20 (!) 102  08/21/19 60   Wt Readings from Last 3 Encounters:  03/06/20 195 lb (88.5 kg)  08/21/19 198 lb (89.8 kg)  06/21/18 213 lb (96.6 kg)    Assessment/Interventions: Review of patient past medical history, allergies, medications, health status, including review of consultants reports, laboratory and other test data, was performed as part of comprehensive evaluation and provision of chronic care management services.   SDOH:  (Social Determinants of Health) assessments and interventions performed: No    CCM Care Plan  Allergies  Allergen Reactions   Morphine Sulfate Hives and Shortness Of Breath   Pneumococcal Vaccine Polyvalent Shortness Of Breath and Swelling    Skin peeled off    Medications Reviewed Today     Reviewed by Verner Chol, [facility] (Pharmacist) on 05/26/21 at 1015  Med List Status: <None>   Medication Order Taking? Sig Documenting Provider Last Dose Status Informant  albuterol (PROAIR HFA) 108 (90 Base) MCG/ACT  inhaler [id number]  TAKE 2 PUFFS BY MOUTH EVERY 4 HOURS AS NEEDED Nelwyn Salisbury, MD  Active   amLODipine (NORVASC) 5 MG tablet [id number]  Take 1 tablet (5 mg total) by mouth daily. Nelwyn Salisbury, MD  Active   budesonide-formoterol [facility]) 80-4.5 MCG/ACT inhaler [id number] Yes INHALE TWO PUFFS into THE lungs TWICE DAILY Nelwyn Salisbury, MD Taking Active   Calcium Carbonate-Vit D-Min (CALCIUM 1200 PO) [id number]  Take 1,200 mg by mouth daily. [provider]  Active Self  Cholecalciferol 25 MCG (1000 UT) tablet [id number]  Take 1,000 Units by mouth daily. [provider]  Active Self  Coenzyme Q10 (CO Q 10 PO) [id number]  Take 100 mg by mouth daily.  [provider]  Active Self  ipratropium-albuterol (DUONEB) 0.5-2.5 (3) MG/3ML SOLN [id number]  Take 3 mLs by nebulization every 4 (four) hours as needed (asthma).  Patient not taking: Reported on 08/22/2020   Whitney Morale, MD  Active   MAGNESIUM PO [id number]  Take 450 mg by mouth at bedtime.  [provider]  Active Self  metoprolol succinate (TOPROL-XL) 100 MG 24 hr tablet [id number]  TAKE ONE TABLET BY MOUTH ONCE DAILY WITH OR immediately following A meal Whitney Morale, MD  Active   montelukast (SINGULAIR) 10 MG tablet [id number]  Take 1 tablet (10 mg total) by mouth daily. Whitney Morale, MD  Active   Multiple Vitamin (MULTIVITAMIN WITH MINERALS) TABS tablet [id number]  Take 1 tablet by mouth daily. [provider]  Active Self  NP THYROID 60 MG tablet [id number]  TAKE ONE TABLET BY MOUTH DAILY Whitney Morale, MD  Active   Omega-3 Fatty Acids (FISH OIL PO) [id number]  Take 1,300 mg by mouth daily.  [provider]  Active Self  omeprazole (PRILOSEC) 40 MG capsule [id number]  TAKE 1 CAPSULE BY MOUTH EVERY DAY Whitney Morale, MD  Active             Patient Active Problem List   Diagnosis Date Noted   Trigger finger, left little finger 10/21/2017   Osteopenia 12/29/2016   Complete heart block  ([place]) 06/17/2016   Insomnia 07/03/2014   Dyslipidemia 01/19/2013   PHLEBITIS, LOWER EXTREMITY 01/16/2008   Hypothyroidism 06/03/2007   HTN (hypertension) 06/03/2007   NECK PAIN, CHRONIC 06/03/2007   History of other specified conditions presenting hazards to health 06/03/2007   ALLERGIC RHINITIS 05/05/2007   Asthma 05/05/2007   GERD 05/05/2007    Immunization History  Administered Date(s) Administered   Influenza, High Dose Seasonal PF 07/24/2016, 04/14/2017, 05/04/2019   Influenza,inj,Quad PF,6+ Mos 07/03/2014   Influenza-Unspecified 06/11/2015, 04/14/2017, 04/20/2018, 05/04/2019   PFIZER(Purple Top)SARS-COV-2 Vaccination 10/30/2019, 11/20/2019, 08/03/2020   Tdap 09/23/2015   Zoster Recombinat (Shingrix) 05/04/2019, 08/24/2019   Patient just got a puppy and name her Precious. She is a cockerspaniel and has lots of energy. She hopes to start walking more with her as she can't do much walking in the summer with the humidity.    Patient reports she has been trying to work on her diet more lately. She has been trying to eat more fiber and she bought an air fryer and has been using it and not frying much. For oil, she uses olive oil and coconut oil. She only eats out every 6 weeks or so when her daughter and son in law come into town and have dinner.  Conditions to be addressed/monitored:  Hypertension, Hyperlipidemia, GERD, Asthma, Hypothyroidism and Osteopenia  Conditions addressed this visit: Hyperlipidemia, osteopenia, asthma  Care Plan : CCM Pharmacy Care Plan  Updates made by Viona Gilmore, [place] since 06/06/2021 12:00 AM     Problem: Problem: Hypertension, Hyperlipidemia, GERD, Asthma, Hypothyroidism and Osteopenia      Long-Range Goal: Patient-Specific Goal   Start Date: 11/19/2020  Expected End Date: 11/19/2021  Recent Progress: On track  Priority: High  Note:   Current Barriers:  Unable to independently monitor therapeutic efficacy  Pharmacist Clinical Goal(s):   Patient will achieve adherence to monitoring guidelines and medication adherence to achieve therapeutic efficacy achieve control of cholesterol as evidenced by next lipid panel  through collaboration with PharmD and provider.   Interventions: 1:1 collaboration with Whitney Morale, MD regarding development and update of comprehensive plan of care as evidenced by provider attestation and  co-signature Inter-disciplinary care team collaboration (see longitudinal plan of care) Comprehensive medication review performed; medication list updated in electronic medical record  Hypertension (BP goal <140/90) -Controlled -Current treatment: amlodipine 5mg ,1 tablet daily metoprolol succinate 100mg , 1 tablet daily -Medications previously tried: lisinopril, losartan -Current home readings: 120/60-65s (couple times of day) -Current dietary habits: patient reports cooking at home all the time and making sure to add salt in moderation to food -Current exercise habits: walking around yard and house (on nice days) -Denies hypotensive/hypertensive symptoms -Educated on Exercise goal of 150 minutes per week; Importance of home blood pressure monitoring; -Counseled to monitor BP at home at least weekly, document, and provide log at future appointments -Recommended to continue current medication  Hyperlipidemia: (LDL goal < 100) -Uncontrolled -Current treatment: omega-3 fatty acids 1 capsule daily  -Medications previously tried: atorvastatin (myalgias)  -Current dietary patterns: eating more fiber in her diet; using an air fryer to fry foods -Current exercise habits: walking (on nice days) -Educated on Cholesterol goals;  Importance of limiting foods high in cholesterol; Exercise goal of 150 minutes per week; Benefits of Zetia for LDL lowering and ASCVD prevention -Counseled on diet and exercise extensively Patient prefers to continue with diet and exercise modifications prior to trying another  medication  Asthma (Goal: control symptoms) -Controlled -Current treatment  Symbicort 80-4.5 mcg/act inhaler inhale 2 puffs twice daily Ipratropium/albuterol 0.5/2.5mg / 29ml, 1 vial via nebulizer every four hours as needed montelukast 10mg , 1 tablet daily  albuterol (Proair HFA), 2 puffs every four hours as needed  -Medications previously tried: Qvar  -Pulmonary function testing: n/a -Exacerbations requiring treatment in last 6 months: none -Patient reports consistent use of maintenance inhaler -Frequency of rescue inhaler use: twice daily in the summer -Counseled on Benefits of consistent maintenance inhaler use When to use rescue inhaler Differences between maintenance and rescue inhalers -Recommended to continue current medication Recommended using Symbicort as a rescue and maintenance inhaler.  GERD (Goal: minimize symptoms) -Controlled -Current treatment  Omeprazole 40mg , 1 capsule daily  Sodium bicarbonate, 1 tablet daily as needed (with morning coffee) -Medications previously tried: Nexium, tums  -Recommended to continue current medication Counseled on non-pharmacologic management of symptoms such as elevating the head of your bed, avoiding eating 2-3 hours before bed, avoiding triggering foods such as acidic, spicy, or fatty foods, eating smaller meals, and wearing clothes that are loose around the waist Recommended moving omeprazole to before meals to improve how well it works   Osteopenia (Goal improve bone density and prevent fractures) -Controlled -Last DEXA Scan: 12/29/16   T-Score femoral neck: -2.1  T-Score total hip: n/a  T-Score lumbar spine: -0.4  T-Score forearm radius: n/a  10-year probability of major osteoporotic fracture: n/a  10-year probability of hip fracture: n/a -Patient is not a candidate for pharmacologic treatment -Current treatment  calcium carbonate/ vitamin D, 1200mg , 1 tablet daily cholecalciferol 1,000 units daily -Medications previously  tried: none  -Recommend [phone] units of vitamin D daily. Recommend 1200 mg of calcium daily from dietary and supplemental sources. Recommend weight-bearing and muscle strengthening exercises for building and maintaining bone density. -Recommended to continue current medication Recommended repeat DEXA (bone density scan)   Hypothyroidism (Goal: TSH 2.5-4.5) -Not ideally controlled -Current treatment  NP thyroid 60mg , 1 tablet daily  -Medications previously tried: none  -Recommended to continue current medication Recommended decreasing dose of NP thryoid to lessen risk of developing osteoporosis.  Health Maintenance -Vaccine gaps: none -Current therapy:  Coenzyme Q10 100 mg daily Magnesium 450 mg  1 tablet at bedtime Multivitamin 1 tablet daily  -Educated on Cost vs benefit of each product must be carefully weighed by individual consumer -Patient is satisfied with current therapy and denies issues -Recommended to continue current medication  Patient Goals/Self-Care Activities Patient will:  - take medications as prescribed check blood pressure at least weekly, document, and provide at future appointments target a minimum of 150 minutes of moderate intensity exercise weekly  Follow Up Plan: Telephone follow up appointment with care management team member scheduled for: 1 year        Medication Assistance: None required.  Patient affirms current coverage meets needs.  Compliance/Adherence/Medication fill history: Care Gaps: Colonoscopy, mammogram, COVID booster, influenza   Star-Rating Drugs: None  Patient's preferred pharmacy is:  [facility] - [place], [place] - [address] [place] [address] Phone: [phone] Fax: [phone]  Uses pill box? Yes Pt endorses 100% compliance  We discussed: Benefits of medication synchronization, packaging and delivery as well as enhanced pharmacist oversight with  [facility]. Patient decided to: Utilize [facility] pharmacy for medication synchronization, packaging and delivery  Care Plan and Follow Up Patient Decision:  Patient agrees to Care Plan and Follow-up.  Plan: Telephone follow up appointment with care management team member scheduled for:  1 year  Jeni Salles, PharmD [place] Pharmacist [employer] at [place] [phone]

## 2021-06-06 ENCOUNTER — Other Ambulatory Visit: Payer: Self-pay | Admitting: Family Medicine

## 2021-06-06 DIAGNOSIS — I10 Essential (primary) hypertension: Secondary | ICD-10-CM

## 2021-06-06 DIAGNOSIS — E785 Hyperlipidemia, unspecified: Secondary | ICD-10-CM

## 2021-06-06 NOTE — Patient Instructions (Signed)
Hi [name],  It was great to catch up again! I love all of the effort you have been putting into your diet to lower your cholesterol, keep up the good work! I'm excited to see what your numbers are when you get them checked with Dr. Clent Ridges.  Also don't forget to call [facility] to schedule your DEXA scan!  Please reach out to me if you have any questions or need anything before our follow up!  Best, Maddie  Gaylord Shih, PharmD, [facility] Clinical Pharmacist [facility] [place] [phone]   Visit Information   Goals Addressed   None    Patient Care Plan: CCM Pharmacy Care Plan     Problem Identified: Problem: Hypertension, Hyperlipidemia, GERD, Asthma, Hypothyroidism and Osteopenia      Long-Range Goal: Patient-Specific Goal   Start Date: 11/19/2020  Expected End Date: 11/19/2021  Recent Progress: On track  Priority: High  Note:   Current Barriers:  Unable to independently monitor therapeutic efficacy  Pharmacist Clinical Goal(s):  Patient will achieve adherence to monitoring guidelines and medication adherence to achieve therapeutic efficacy achieve control of cholesterol as evidenced by next lipid panel  through collaboration with PharmD and provider.   Interventions: 1:1 collaboration with Nelwyn Salisbury, MD regarding development and update of comprehensive plan of care as evidenced by provider attestation and co-signature Inter-disciplinary care team collaboration (see longitudinal plan of care) Comprehensive medication review performed; medication list updated in electronic medical record  Hypertension (BP goal <140/90) -Controlled -Current treatment: amlodipine 5mg ,1 tablet daily metoprolol succinate 100mg , 1 tablet daily -Medications previously tried: lisinopril, losartan -Current home readings: 120/60-65s (couple times of day) -Current dietary habits: patient reports cooking at home all the time and making sure to add salt in moderation to food -Current  exercise habits: walking around yard and house (on nice days) -Denies hypotensive/hypertensive symptoms -Educated on Exercise goal of 150 minutes per week; Importance of home blood pressure monitoring; -Counseled to monitor BP at home at least weekly, document, and provide log at future appointments -Recommended to continue current medication  Hyperlipidemia: (LDL goal < 100) -Uncontrolled -Current treatment: omega-3 fatty acids 1 capsule daily  -Medications previously tried: atorvastatin (myalgias)  -Current dietary patterns: eating more fiber in her diet; using an air fryer to fry foods -Current exercise habits: walking (on nice days) -Educated on Cholesterol goals;  Importance of limiting foods high in cholesterol; Exercise goal of 150 minutes per week; Benefits of Zetia for LDL lowering and ASCVD prevention -Counseled on diet and exercise extensively Patient prefers to continue with diet and exercise modifications prior to trying another medication  Asthma (Goal: control symptoms) -Controlled -Current treatment  Symbicort 80-4.5 mcg/act inhaler inhale 2 puffs twice daily Ipratropium/albuterol 0.5/2.5mg / 63ml, 1 vial via nebulizer every four hours as needed montelukast 10mg , 1 tablet daily  albuterol (Proair HFA), 2 puffs every four hours as needed  -Medications previously tried: Qvar  -Pulmonary function testing: n/a -Exacerbations requiring treatment in last 6 months: none -Patient reports consistent use of maintenance inhaler -Frequency of rescue inhaler use: twice daily in the summer -Counseled on Benefits of consistent maintenance inhaler use When to use rescue inhaler Differences between maintenance and rescue inhalers -Recommended to continue current medication Recommended using Symbicort as a rescue and maintenance inhaler.  GERD (Goal: minimize symptoms) -Controlled -Current treatment  Omeprazole 40mg , 1 capsule daily  Sodium bicarbonate, 1 tablet daily as needed  (with morning coffee) -Medications previously tried: Nexium, tums  -Recommended to continue current medication Counseled on non-pharmacologic  management of symptoms such as elevating the head of your bed, avoiding eating 2-3 hours before bed, avoiding triggering foods such as acidic, spicy, or fatty foods, eating smaller meals, and wearing clothes that are loose around the waist Recommended moving omeprazole to before meals to improve how well it works   Osteopenia (Goal improve bone density and prevent fractures) -Controlled -Last DEXA Scan: 12/29/16   T-Score femoral neck: -2.1  T-Score total hip: n/a  T-Score lumbar spine: -0.4  T-Score forearm radius: n/a  10-year probability of major osteoporotic fracture: n/a  10-year probability of hip fracture: n/a -Patient is not a candidate for pharmacologic treatment -Current treatment  calcium carbonate/ vitamin D, 1200mg , 1 tablet daily cholecalciferol 1,000 units daily -Medications previously tried: none  -Recommend [phone] units of vitamin D daily. Recommend 1200 mg of calcium daily from dietary and supplemental sources. Recommend weight-bearing and muscle strengthening exercises for building and maintaining bone density. -Recommended to continue current medication Recommended repeat DEXA (bone density scan)   Hypothyroidism (Goal: TSH 2.5-4.5) -Not ideally controlled -Current treatment  NP thyroid 60mg , 1 tablet daily  -Medications previously tried: none  -Recommended to continue current medication Recommended decreasing dose of NP thryoid to lessen risk of developing osteoporosis.  Health Maintenance -Vaccine gaps: none -Current therapy:  Coenzyme Q10 100 mg daily Magnesium 450 mg 1 tablet at bedtime Multivitamin 1 tablet daily  -Educated on Cost vs benefit of each product must be carefully weighed by individual consumer -Patient is satisfied with current therapy and denies issues -Recommended to continue current  medication  Patient Goals/Self-Care Activities Patient will:  - take medications as prescribed check blood pressure at least weekly, document, and provide at future appointments target a minimum of 150 minutes of moderate intensity exercise weekly  Follow Up Plan: Telephone follow up appointment with care management team member scheduled for: 1 year       Patient verbalizes understanding of instructions provided today and agrees to view in MyChart.  Telephone follow up appointment with pharmacy team member scheduled for:1 year  , [facility]

## 2021-06-06 NOTE — Telephone Encounter (Signed)
Pt needs appointment for further refills 

## 2021-06-09 ENCOUNTER — Telehealth: Payer: Self-pay | Admitting: Pharmacist

## 2021-06-09 NOTE — Chronic Care Management (AMB) (Signed)
Chronic Care Management Pharmacy Assistant   Name: Whitney Evans  MRN: [id number] DOB: [birth year]   Reason for Encounter: Medication Review / Medication Coordination Call    Recent office visits:  None  Recent consult visits:  None  Hospital visits:  None in previous 6 months  Medications: Outpatient Encounter Medications as of 06/09/2021  Medication Sig   albuterol (PROAIR HFA) 108 (90 Base) MCG/ACT inhaler TAKE 2 PUFFS BY MOUTH EVERY 4 HOURS AS NEEDED   amLODipine (NORVASC) 5 MG tablet Take 1 tablet (5 mg total) by mouth daily.   budesonide-formoterol (SYMBICORT) 80-4.5 MCG/ACT inhaler INHALE TWO PUFFS into THE lungs TWICE DAILY   Calcium Carbonate-Vit D-Min (CALCIUM 1200 PO) Take 1,200 mg by mouth daily.   Cholecalciferol 25 MCG (1000 UT) tablet Take 1,000 Units by mouth daily.   Coenzyme Q10 (CO Q 10 PO) Take 100 mg by mouth daily.    ipratropium-albuterol (DUONEB) 0.5-2.5 (3) MG/3ML SOLN Take 3 mLs by nebulization every 4 (four) hours as needed (asthma). (Patient not taking: Reported on 08/22/2020)   MAGNESIUM PO Take 450 mg by mouth at bedtime.    metoprolol succinate (TOPROL-XL) 100 MG 24 hr tablet TAKE ONE TABLET BY MOUTH ONCE DAILY WITH OR immediately following A meal   montelukast (SINGULAIR) 10 MG tablet Take 1 tablet (10 mg total) by mouth daily.   Multiple Vitamin (MULTIVITAMIN WITH MINERALS) TABS tablet Take 1 tablet by mouth daily.   NP THYROID 60 MG tablet TAKE ONE TABLET BY MOUTH DAILY   Omega-3 Fatty Acids (FISH OIL PO) Take 1,300 mg by mouth daily.    omeprazole (PRILOSEC) 40 MG capsule TAKE 1 CAPSULE BY MOUTH EVERY DAY   No facility-administered encounter medications on file as of 06/09/2021.  Reviewed chart for medication changes ahead of medication coordination call.  No OVs, Consults, or hospital visits since last care coordination call/Pharmacist visit. (If appropriate, list visit date, provider name)  No medication changes indicated OR if  recent visit, treatment plan here.  BP Readings from Last 3 Encounters:  03/06/20 126/62  02/15/20 136/70  08/21/19 106/67    Lab Results  Component Value Date   HGBA1C 5.8 11/28/2009     Patient obtains medications through Vials  90 Days   Last adherence delivery included: (medication name and frequency)  Metoprolol succinate (TOPROL-XL) 100 MG 24 hr- one tablet daily NP THYROID 60 mg: one tablet daily Budesonide-formoterol (SYMBICORT) 80-4.5 MCG/ACT inhaler: two puffs into lungs twice daily Omeprazole (PRILOSEC) 40 mg: one capsule daily Montelukast (SINGULAIR) 10 mg: one tablet daily Amlodipine (NORVASC) 5 mg: one tablet daily  Patient declined (meds) last dleivery 03/20/2021 due to PRN use/additional supply on hand. Albuterol (PROAIR HFA) 108 (90 Base) MCG/ACT inhaler: one puff by mouth every 4 hours as needed Ipratropium-albuterol (DUONEB) 0.5-2.5 (3) MG/3ML SOLN   Patient is due for next adherence delivery on: 06/19/2021.  Called patient and reviewed medications and coordinated delivery.  This delivery to include: Metoprolol succinate (TOPROL-XL) 100 MG 24 hr- one tablet daily NP THYROID 60 mg: one tablet daily Montelukast (SINGULAIR) 10 mg: one tablet daily Amlodipine (NORVASC) 5 mg: one tablet daily  Patient will need a short fill of (med), prior to adherence delivery. (To align with sync date or if PRN med) No  Patient declined the following medications (meds) due to (reason) Budesonide-formoterol (SYMBICORT) 80-4.5 MCG/ACT inhaler: two puffs into lungs twice daily       Patient states she has plenty left enough for 90  days.   Omeprazole (PRILOSEC) 40 mg: one capsule daily       Patient states she has plenty left, she takes this prn and has                enough for 90 days  Patient needs refills for  Metoprolol succinate (TOPROL-XL) 100 MG 24 hr- one tablet daily NP THYROID 60 mg: one tablet daily Montelukast (SINGULAIR) 10 mg: one tablet daily Amlodipine  (NORVASC) 5 mg: one tablet daily.   Confirmed delivery date of 06/19/2021, advised patient that pharmacy will contact them the morning of delivery.   Care Gaps:  AWV - completed 08/27/2020 Colonoscopy - overdue 05/23/2017 Mammogram - overdue 12/25/2018 Covid 19 vacccine booster 4 - overdue 12/01/2020 Flu vaccine - due  Star Rating Drugs: None  Inetta Fermo [facility]  Health Concierge [phone]

## 2021-06-11 ENCOUNTER — Ambulatory Visit (INDEPENDENT_AMBULATORY_CARE_PROVIDER_SITE_OTHER): Payer: PPO

## 2021-06-11 DIAGNOSIS — I442 Atrioventricular block, complete: Secondary | ICD-10-CM

## 2021-06-11 LAB — CUP PACEART REMOTE DEVICE CHECK
Battery Remaining Longevity: 64 mo
Battery Remaining Percentage: 56 %
Battery Voltage: 2.99 V
Brady Statistic AP VP Percent: 5.1 %
Brady Statistic AP VS Percent: 24 %
Brady Statistic AS VP Percent: 41 %
Brady Statistic AS VS Percent: 30 %
Brady Statistic RA Percent Paced: 29 %
Brady Statistic RV Percent Paced: 46 %
Date Time Interrogation Session: 20221005020014
Implantable Lead Implant Date: 20171012
Implantable Lead Implant Date: 20171012
Implantable Lead Location: 753859
Implantable Lead Location: 753860
Implantable Pulse Generator Implant Date: 20171012
Lead Channel Impedance Value: 440 Ohm
Lead Channel Impedance Value: 610 Ohm
Lead Channel Pacing Threshold Amplitude: 0.5 V
Lead Channel Pacing Threshold Amplitude: 0.5 V
Lead Channel Pacing Threshold Pulse Width: 0.5 ms
Lead Channel Pacing Threshold Pulse Width: 0.5 ms
Lead Channel Sensing Intrinsic Amplitude: 2.2 mV
Lead Channel Sensing Intrinsic Amplitude: 4.2 mV
Lead Channel Setting Pacing Amplitude: 2 V
Lead Channel Setting Pacing Amplitude: 2.5 V
Lead Channel Setting Pacing Pulse Width: 0.5 ms
Lead Channel Setting Sensing Sensitivity: 2 mV
Pulse Gen Model: 2272
Pulse Gen Serial Number: 7955201

## 2021-06-19 NOTE — Progress Notes (Signed)
Remote pacemaker transmission.   

## 2021-07-23 ENCOUNTER — Telehealth: Payer: Self-pay

## 2021-07-23 ENCOUNTER — Telehealth: Payer: Self-pay | Admitting: Pharmacist

## 2021-07-23 NOTE — Chronic Care Management (AMB) (Signed)
    Chronic Care Management Pharmacy Assistant   Name: Whitney Evans  MRN: [id number] DOB: August 22, 1946  Reason for Encounter: Gaps report for recent blood pressure readings and request for AWV sent to Carrie Mew. Spoke with patient she states her blood pressure readings have been good, her recent readings are: 07/23/2021 120/70 07/20/2021  119/65 07/18/2021  122/70   Medications: Outpatient Encounter Medications as of 07/23/2021  Medication Sig   albuterol (PROAIR HFA) 108 (90 Base) MCG/ACT inhaler TAKE 2 PUFFS BY MOUTH EVERY 4 HOURS AS NEEDED   amLODipine (NORVASC) 5 MG tablet Take 1 tablet (5 mg total) by mouth daily.   budesonide-formoterol (SYMBICORT) 80-4.5 MCG/ACT inhaler INHALE TWO PUFFS into THE lungs TWICE DAILY   Calcium Carbonate-Vit D-Min (CALCIUM 1200 PO) Take 1,200 mg by mouth daily.   Cholecalciferol 25 MCG (1000 UT) tablet Take 1,000 Units by mouth daily.   Coenzyme Q10 (CO Q 10 PO) Take 100 mg by mouth daily.    ipratropium-albuterol (DUONEB) 0.5-2.5 (3) MG/3ML SOLN Take 3 mLs by nebulization every 4 (four) hours as needed (asthma). (Patient not taking: Reported on 08/22/2020)   MAGNESIUM PO Take 450 mg by mouth at bedtime.    metoprolol succinate (TOPROL-XL) 100 MG 24 hr tablet TAKE ONE TABLET BY MOUTH ONCE DAILY WITH OR immediately following A meal   montelukast (SINGULAIR) 10 MG tablet Take 1 tablet (10 mg total) by mouth daily.   Multiple Vitamin (MULTIVITAMIN WITH MINERALS) TABS tablet Take 1 tablet by mouth daily.   NP THYROID 60 MG tablet TAKE ONE TABLET BY MOUTH DAILY   Omega-3 Fatty Acids (FISH OIL PO) Take 1,300 mg by mouth daily.    omeprazole (PRILOSEC) 40 MG capsule TAKE 1 CAPSULE BY MOUTH EVERY DAY   No facility-administered encounter medications on file as of 07/23/2021.    Care Gaps: AWV - Message sent to Carrie Mew to schedule Colonoscopy - overdue  Mammogram - overdue Covid 19 vacccine - overdue  Flu vaccine - due Last BP - 126/62 on  03/06/2020 Patients last BP reading - 120/70 on 07/23/2021  Star Rating Drugs: None  Inetta Fermo [facility]  Clinical Pharmacist Assistant [phone]

## 2021-07-23 NOTE — Telephone Encounter (Signed)
Last OV 12/05/19 for acute concerns; last OV for medical conditions 12/30/18.  Pt notified of above & agrees to schedule appt for 07/28/21.

## 2021-07-28 ENCOUNTER — Encounter: Payer: PPO | Admitting: Family Medicine

## 2021-08-03 ENCOUNTER — Ambulatory Visit: Payer: Self-pay | Admitting: Pharmacist

## 2021-08-03 DIAGNOSIS — I10 Essential (primary) hypertension: Secondary | ICD-10-CM

## 2021-08-03 DIAGNOSIS — M8580 Other specified disorders of bone density and structure, unspecified site: Secondary | ICD-10-CM

## 2021-08-03 NOTE — Progress Notes (Signed)
Updated blood pressure in chart with patient-reported home BP:  BP Readings from Last 3 Encounters:  07/23/21 120/70  03/06/20 126/62  02/15/20 136/70    Care Plan : CCM Pharmacy Care Plan  Updates made by Verner Chol, RPH since 08/03/2021 12:00 AM     Problem: Problem: Hypertension, Hyperlipidemia, GERD, Asthma, Hypothyroidism and Osteopenia      Long-Range Goal: Patient-Specific Goal   Start Date: 11/19/2020  Expected End Date: 11/19/2021  Recent Progress: On track  Priority: High  Note:   Current Barriers:  Unable to independently monitor therapeutic efficacy  Pharmacist Clinical Goal(s):  Patient will achieve adherence to monitoring guidelines and medication adherence to achieve therapeutic efficacy achieve control of cholesterol as evidenced by next lipid panel  through collaboration with PharmD and provider.   Interventions: 1:1 collaboration with Nelwyn Salisbury, MD regarding development and update of comprehensive plan of care as evidenced by provider attestation and co-signature Inter-disciplinary care team collaboration (see longitudinal plan of care) Comprehensive medication review performed; medication list updated in electronic medical record  Hypertension (BP goal <140/90) -Controlled -Current treatment: amlodipine 5mg ,1 tablet daily metoprolol succinate 100mg , 1 tablet daily -Medications previously tried: lisinopril, losartan -Current home readings: 120/60-65s (couple times of day); 120/70 (07/23/21) -Current dietary habits: patient reports cooking at home all the time and making sure to add salt in moderation to food -Current exercise habits: walking around yard and house (on nice days) -Denies hypotensive/hypertensive symptoms -Educated on Exercise goal of 150 minutes per week; Importance of home blood pressure monitoring; -Counseled to monitor BP at home at least weekly, document, and provide log at future appointments -Recommended to continue  current medication  Hyperlipidemia: (LDL goal < 100) -Uncontrolled -Current treatment: omega-3 fatty acids 1 capsule daily  -Medications previously tried: atorvastatin (myalgias)  -Current dietary patterns: eating more fiber in her diet; using an air fryer to fry foods -Current exercise habits: walking (on nice days) -Educated on Cholesterol goals;  Importance of limiting foods high in cholesterol; Exercise goal of 150 minutes per week; Benefits of Zetia for LDL lowering and ASCVD prevention -Counseled on diet and exercise extensively Patient prefers to continue with diet and exercise modifications prior to trying another medication  Asthma (Goal: control symptoms) -Controlled -Current treatment  Symbicort 80-4.5 mcg/act inhaler inhale 2 puffs twice daily Ipratropium/albuterol 0.5/2.5mg / 30ml, 1 vial via nebulizer every four hours as needed montelukast 10mg , 1 tablet daily  albuterol (Proair HFA), 2 puffs every four hours as needed  -Medications previously tried: Qvar  -Pulmonary function testing: n/a -Exacerbations requiring treatment in last 6 months: none -Patient reports consistent use of maintenance inhaler -Frequency of rescue inhaler use: twice daily in the summer -Counseled on Benefits of consistent maintenance inhaler use When to use rescue inhaler Differences between maintenance and rescue inhalers -Recommended to continue current medication Recommended using Symbicort as a rescue and maintenance inhaler.  GERD (Goal: minimize symptoms) -Controlled -Current treatment  Omeprazole 40mg , 1 capsule daily  Sodium bicarbonate, 1 tablet daily as needed (with morning coffee) -Medications previously tried: Nexium, tums  -Recommended to continue current medication Counseled on non-pharmacologic management of symptoms such as elevating the head of your bed, avoiding eating 2-3 hours before bed, avoiding triggering foods such as acidic, spicy, or fatty foods, eating smaller  meals, and wearing clothes that are loose around the waist Recommended moving omeprazole to before meals to improve how well it works   Osteopenia (Goal improve bone density and prevent fractures) -Controlled -Last DEXA Scan: 12/29/16  T-Score femoral neck: -2.1  T-Score total hip: n/a  T-Score lumbar spine: -0.4  T-Score forearm radius: n/a  10-year probability of major osteoporotic fracture: n/a  10-year probability of hip fracture: n/a -Patient is not a candidate for pharmacologic treatment -Current treatment  calcium carbonate/ vitamin D, 1200mg , 1 tablet daily cholecalciferol 1,000 units daily -Medications previously tried: none  -Recommend [phone] units of vitamin D daily. Recommend 1200 mg of calcium daily from dietary and supplemental sources. Recommend weight-bearing and muscle strengthening exercises for building and maintaining bone density. -Recommended to continue current medication Recommended repeat DEXA (bone density scan)   Hypothyroidism (Goal: TSH 2.5-4.5) -Not ideally controlled -Current treatment  NP thyroid 60mg , 1 tablet daily  -Medications previously tried: none  -Recommended to continue current medication Recommended decreasing dose of NP thryoid to lessen risk of developing osteoporosis.  Health Maintenance -Vaccine gaps: none -Current therapy:  Coenzyme Q10 100 mg daily Magnesium 450 mg 1 tablet at bedtime Multivitamin 1 tablet daily  -Educated on Cost vs benefit of each product must be carefully weighed by individual consumer -Patient is satisfied with current therapy and denies issues -Recommended to continue current medication  Patient Goals/Self-Care Activities Patient will:  - take medications as prescribed check blood pressure at least weekly, document, and provide at future appointments target a minimum of 150 minutes of moderate intensity exercise weekly  Follow Up Plan: Telephone follow up appointment with care management team member  scheduled for: 1 year      , [facility]

## 2021-08-18 ENCOUNTER — Ambulatory Visit (INDEPENDENT_AMBULATORY_CARE_PROVIDER_SITE_OTHER): Payer: PPO

## 2021-08-18 VITALS — Ht 65.0 in | Wt 200.0 lb

## 2021-08-18 DIAGNOSIS — Z0000 Encounter for general adult medical examination without abnormal findings: Secondary | ICD-10-CM | POA: Diagnosis not present

## 2021-08-18 NOTE — Progress Notes (Signed)
I connected with Whitney Evans today by telephone and verified that I am speaking with the correct person using two identifiers. Location patient: home Location provider: work Persons participating in the virtual visit: Whitney, Anastos LPN.   I discussed the limitations, risks, security and privacy concerns of performing an evaluation and management service by telephone and the availability of in person appointments. I also discussed with the patient that there may be a patient responsible charge related to this service. The patient expressed understanding and verbally consented to this telephonic visit.    Interactive audio and video telecommunications were attempted between this provider and patient, however failed, due to patient having technical difficulties OR patient did not have access to video capability.  We continued and completed visit with audio only.     Vital signs may be patient reported or missing.  Subjective:   Whitney Evans is a 76 y.o. female who presents for Medicare Annual (Subsequent) preventive examination.  Review of Systems     Cardiac Risk Factors include: dyslipidemia;hypertension;obesity (BMI >30kg/m2)     Objective:    Today's Vitals   08/18/21 1426  Weight: 200 lb (90.7 kg)  Height:  (1.651 m)  PainSc: 2    Body mass index is 33.28 kg/m.  Advanced Directives 08/18/2021 08/22/2020 08/21/2019 03/25/2017 06/17/2016 06/17/2016  Does Patient Have a Medical Advance Directive? No Yes No Yes Yes Yes  Type of Advance Directive - - Web designer;Living will - Healthcare Power of Attorney  Does patient want to make changes to medical advance directive? - Yes (MAU/Ambulatory/Procedural Areas - Information given) - No - Patient declined - No - Patient declined  Copy of Healthcare Power of Attorney in Chart? - - - No - copy requested - No - copy requested  Would patient like information on creating a medical  advance directive? - - Yes (MAU/Ambulatory/Procedural Areas - Information given) - - -    Current Medications (verified) Outpatient Encounter Medications as of 08/18/2021  Medication Sig   albuterol (PROAIR HFA) 108 (90 Base) MCG/ACT inhaler TAKE 2 PUFFS BY MOUTH EVERY 4 HOURS AS NEEDED   amLODipine (NORVASC) 5 MG tablet Take 1 tablet (5 mg total) by mouth daily.   budesonide-formoterol (SYMBICORT) 80-4.5 MCG/ACT inhaler INHALE TWO PUFFS into THE lungs TWICE DAILY   Calcium Carbonate-Vit D-Min (CALCIUM 1200 PO) Take 1,200 mg by mouth daily.   Cholecalciferol 25 MCG (1000 UT) tablet Take 1,000 Units by mouth daily.   Coenzyme Q10 (CO Q 10 PO) Take 100 mg by mouth daily.    MAGNESIUM PO Take 450 mg by mouth at bedtime.    metoprolol succinate (TOPROL-XL) 100 MG 24 hr tablet TAKE ONE TABLET BY MOUTH ONCE DAILY WITH OR immediately following A meal   montelukast (SINGULAIR) 10 MG tablet Take 1 tablet (10 mg total) by mouth daily.   Multiple Vitamin (MULTIVITAMIN WITH MINERALS) TABS tablet Take 1 tablet by mouth daily.   NP THYROID 60 MG tablet TAKE ONE TABLET BY MOUTH DAILY   Omega-3 Fatty Acids (FISH OIL PO) Take 1,300 mg by mouth daily.    omeprazole (PRILOSEC) 40 MG capsule TAKE 1 CAPSULE BY MOUTH EVERY DAY   ipratropium-albuterol (DUONEB) 0.5-2.5 (3) MG/3ML SOLN Take 3 mLs by nebulization every 4 (four) hours as needed (asthma). (Patient not taking: Reported on 08/22/2020)   No facility-administered encounter medications on file as of 08/18/2021.    Allergies (verified) Morphine sulfate and Pneumococcal vaccine polyvalent  History: Past Medical History:  Diagnosis Date   Allergic rhinitis    Asthma    Chronic hoarseness    sees Dr. [name] (ENT) in [facility]    Essential hypertension    GERD (gastroesophageal reflux disease)    Hypothyroidism    Insomnia    Neck pain, chronic    Past Surgical History:  Procedure Laterality Date   CESAREAN SECTION     x 2   COLONOSCOPY   05/24/07   per Dr. Russella Dar, benign polpys, and diverticulosis, repeat 5 years    EP IMPLANTABLE DEVICE N/A 06/18/2016   Procedure: Pacemaker Implant;  Surgeon: Will [name], MD;  Location: [facility] INVASIVE [facility];  Service: Cardiovascular;  Laterality: N/A;   EYE SURGERY     left foot surgery     VESICOVAGINAL FISTULA CLOSURE W/ TAH     WISDOM TOOTH EXTRACTION     Family History  Problem Relation Age of Onset   Coronary artery disease Other    Diabetes Other    Hyperlipidemia Other    Hypertension Other    Stroke Other    Stroke Mother    Heart attack Father 11   Social History   Socioeconomic History   Marital status: Divorced    Spouse name: Not on file   Number of children: 2   Years of education: 4 years college   Highest education level: Bachelor's degree (e.g., BA, AB, BS)  Occupational History   Occupation: retired respiratory therapist  Tobacco Use   Smoking status: Former   Smokeless tobacco: Never  Building services engineer Use: Never used  Substance and Sexual Activity   Alcohol use: Yes    Alcohol/week: 2.0 standard drinks    Types: 2 Standard drinks or equivalent per week    Comment: glass of red wine   Drug use: No   Sexual activity: Not on file  Other Topics Concern   Not on file  Social History Narrative   Retired respiratory therapist; divorced   1 daughter [place], [place] ICU nurse   1 son in [place] - Acupuncturist   Social Determinants of Health   Financial Resource Strain: Low Risk    Difficulty of Paying Living Expenses: Not hard at all  Food Insecurity: No Food Insecurity   Worried About Programme researcher, broadcasting/film/video in the Last Year: Never true   Barista in the Last Year: Never true  Transportation Needs: No Transportation Needs   Lack of Transportation (Medical): No   Lack of Transportation (Non-Medical): No  Physical Activity: Inactive   Days of Exercise per Week: 0 days   Minutes of Exercise per Session: 0 min  Stress: No Stress  Concern Present   Feeling of Stress : Only a little  Social Connections: Socially Isolated   Frequency of Communication with Friends and Family: Three times a week   Frequency of Social Gatherings with Friends and Family: Once a week   Attends Religious Services: Never   Database administrator or Organizations: No   Attends Banker Meetings: Never   Marital Status: Widowed    Tobacco Counseling Counseling given: Not Answered   Clinical Intake:  Pre-visit preparation completed: Yes  Pain : 0-10 Pain Score: 2  Pain Type: Chronic pain Pain Location: Generalized Pain Descriptors / Indicators: Aching Pain Onset: More than a month ago Pain Frequency: Constant     Nutritional Status: BMI > 30  Obese Nutritional Risks: None Diabetes: No  How often do you need to have someone help you when you read instructions, pamphlets, or other written materials from your doctor or pharmacy?: 1 - Never What is the last grade level you completed in school?: bachelors degree  Diabetic? no  Interpreter Needed?: No  Information entered by :: NAllen LPN   Activities of Daily Living In your present state of health, do you have any difficulty performing the following activities: 08/18/2021 08/17/2021  Hearing? N N  Vision? N N  Difficulty concentrating or making decisions? N N  Walking or climbing stairs? N N  Dressing or bathing? N N  Doing errands, shopping? N N  Preparing Food and eating ? N N  Using the Toilet? N N  In the past six months, have you accidently leaked urine? N N  Do you have problems with loss of bowel control? N N  Managing your Medications? N N  Managing your Finances? N N  Housekeeping or managing your Housekeeping? N N  Some recent data might be hidden    Patient Care Team: Nelwyn Salisbury, MD as PCP - General (Family Medicine) Regan Lemming, MD as PCP - Electrophysiology (Cardiology) Verner Chol, [facility] as Pharmacist  (Pharmacist)  Indicate any recent Medical Services you may have received from other than [facility] providers in the past year (date may be approximate).     Assessment:   This is a routine wellness examination for Whitney Evans.  Hearing/Vision screen Vision Screening - Comments:: Regular eye exams, [facility]  Dietary issues and exercise activities discussed: Current Exercise Habits: The patient does not participate in regular exercise at present   Goals Addressed             This Visit's Progress    Patient Stated       08/18/2021, more exercise       Depression Screen PHQ 2/9 Scores 08/18/2021 08/22/2020 08/21/2019 03/25/2017 09/23/2015 07/03/2014  PHQ - 2 Score 0 0 2 1 0 0  PHQ- 9 Score - 0 3 - - -    Fall Risk Fall Risk  08/18/2021 08/17/2021 08/22/2020 04/02/2020 08/21/2019  Falls in the past year? 0 0 0 0 0  Comment - - - Emmi Telephone Survey: data to providers prior to load -  Number falls in past yr: - 0 0 - -  Injury with Fall? - 0 0 - -  Risk for fall due to : Medication side effect - No Fall Risks - -  Follow up Falls evaluation completed;Education provided;Falls prevention discussed - Falls evaluation completed;Falls prevention discussed - -    FALL RISK PREVENTION PERTAINING TO THE HOME:  Any stairs in or around the home? Yes  If so, are there any without handrails? No  Home free of loose throw rugs in walkways, pet beds, electrical cords, etc? Yes  Adequate lighting in your home to reduce risk of falls? Yes   ASSISTIVE DEVICES UTILIZED TO PREVENT FALLS:  Life alert? No  Use of a cane, walker or w/c? No  Grab bars in the bathroom? No  Shower chair or bench in shower? No  Elevated toilet seat or a handicapped toilet? No   TIMED UP AND GO:  Was the test performed? No .      Cognitive Function:     6CIT Screen 08/18/2021 08/21/2019  What Year? 0 points 0 points  What month? 0 points 0 points  What time? 0 points -  Count back from 20 0 points 0 points  Months in reverse 0 points 0 points  Repeat phrase 4 points 0 points  Total Score 4 -    Immunizations Immunization History  Administered Date(s) Administered   Influenza, High Dose Seasonal PF 07/24/2016, 04/14/2017, 05/04/2019   Influenza,inj,Quad PF,6+ Mos 07/03/2014   Influenza-Unspecified 06/11/2015, 04/14/2017, 04/20/2018, 05/04/2019   PFIZER(Purple Top)SARS-COV-2 Vaccination 10/30/2019, 11/20/2019, 08/03/2020   Tdap 09/23/2015   Zoster Recombinat (Shingrix) 05/04/2019, 08/24/2019    TDAP status: Up to date  Flu Vaccine status: Due, Education has been provided regarding the importance of this vaccine. Advised may receive this vaccine at local pharmacy or Health Dept. Aware to provide a copy of the vaccination record if obtained from local pharmacy or Health Dept. Verbalized acceptance and understanding.  Pneumococcal vaccine status: Declined,  Education has been provided regarding the importance of this vaccine but patient still declined. Advised may receive this vaccine at local pharmacy or Health Dept. Aware to provide a copy of the vaccination record if obtained from local pharmacy or Health Dept. Verbalized acceptance and understanding.   Covid-19 vaccine status: Completed vaccines  Qualifies for Shingles Vaccine? Yes   Zostavax completed No   Shingrix Completed?: Yes  Screening Tests Health Maintenance  Topic Date Due   COVID-19 Vaccine (4 - Booster for Pfizer series) 09/28/2020   INFLUENZA VACCINE  04/07/2021   Pneumonia Vaccine 67+ Years old (1 - PCV) 08/18/2022 (Originally 04/28/1952)   MAMMOGRAM  08/18/2022 (Originally 12/25/2018)   COLONOSCOPY (Pts 45-19yrs Insurance coverage will need to be confirmed)  08/18/2022 (Originally 05/23/2017)   TETANUS/TDAP  09/22/2025   DEXA SCAN  Completed   Hepatitis C Screening  Completed   Zoster Vaccines- Shingrix  Completed   HPV VACCINES  Aged Out    Health Maintenance  Health Maintenance Due  Topic Date Due   COVID-19  Vaccine (4 - Booster for Pfizer series) 09/28/2020   INFLUENZA VACCINE  04/07/2021    Colorectal cancer screening: decline   Mammogram status: decline  Bone Density status: Completed 12/24/2016.   Lung Cancer Screening: (Low Dose CT Chest recommended if Age 67-80 years, 30 pack-year currently smoking OR have quit w/in 15years.) does not qualify.   Lung Cancer Screening Referral: no  Additional Screening:  Hepatitis C Screening: does qualify; Completed 10/21/2017  Vision Screening: Recommended annual ophthalmology exams for early detection of glaucoma and other disorders of the eye. Is the patient up to date with their annual eye exam?  Yes  Who is the provider or what is the name of the office in which the patient attends annual eye exams? [facility] If pt is not established with a provider, would they like to be referred to a provider to establish care? No .   Dental Screening: Recommended annual dental exams for proper oral hygiene  Community Resource Referral / Chronic Care Management: CRR required this visit?  No   CCM required this visit?  No      Plan:     I have personally reviewed and noted the following in the patient's chart:   Medical and social history Use of alcohol, tobacco or illicit drugs  Current medications and supplements including opioid prescriptions.  Functional ability and status Nutritional status Physical activity Advanced directives List of other physicians Hospitalizations, surgeries, and ER visits in previous 12 months Vitals Screenings to include cognitive, depression, and falls Referrals and appointments  In addition, I have reviewed and discussed with patient certain preventive protocols, quality metrics, and best practice recommendations. A written personalized care plan for  preventive services as well as general preventive health recommendations were provided to patient.     Barb Merino, LPN   [id number]   Nurse Notes:  none

## 2021-08-18 NOTE — Patient Instructions (Signed)
[name] , Thank you for taking time to come for your Medicare Wellness Visit. I appreciate your ongoing commitment to your health goals. Please review the following plan we discussed and let me know if I can assist you in the future.   Screening recommendations/referrals: Colonoscopy: decline Mammogram: decline Bone Density: completed 12/24/2016 Recommended yearly ophthalmology/optometry visit for glaucoma screening and checkup Recommended yearly dental visit for hygiene and checkup  Vaccinations: Influenza vaccine: due Pneumococcal vaccine: allergy Tdap vaccine: completed 09/23/2015, due 09/22/2025 Shingles vaccine: completed   Covid-19: 08/03/2020, 11/20/2019, 10/30/2019  Advanced directives: Advance directive discussed with you today.   Conditions/risks identified: none  Next appointment: Follow up in one year for your annual wellness visit    Preventive Care 65 Years and Older, Female Preventive care refers to lifestyle choices and visits with your health care provider that can promote health and wellness. What does preventive care include? A yearly physical exam. This is also called an annual well check. Dental exams once or twice a year. Routine eye exams. Ask your health care provider how often you should have your eyes checked. Personal lifestyle choices, including: Daily care of your teeth and gums. Regular physical activity. Eating a healthy diet. Avoiding tobacco and drug use. Limiting alcohol use. Practicing safe sex. Taking low-dose aspirin every day. Taking vitamin and mineral supplements as recommended by your health care provider. What happens during an annual well check? The services and screenings done by your health care provider during your annual well check will depend on your age, overall health, lifestyle risk factors, and family history of disease. Counseling  Your health care provider may ask you questions about your: Alcohol use. Tobacco  use. Drug use. Emotional well-being. Home and relationship well-being. Sexual activity. Eating habits. History of falls. Memory and ability to understand (cognition). Work and work Astronomer. Reproductive health. Screening  You may have the following tests or measurements: Height, weight, and BMI. Blood pressure. Lipid and cholesterol levels. These may be checked every 5 years, or more frequently if you are over 36 years old. Skin check. Lung cancer screening. You may have this screening every year starting at age 65 if you have a 30-pack-year history of smoking and currently smoke or have quit within the past 15 years. Fecal occult blood test (FOBT) of the stool. You may have this test every year starting at age 46. Flexible sigmoidoscopy or colonoscopy. You may have a sigmoidoscopy every 5 years or a colonoscopy every 10 years starting at age 85. Hepatitis C blood test. Hepatitis B blood test. Sexually transmitted disease (STD) testing. Diabetes screening. This is done by checking your blood sugar (glucose) after you have not eaten for a while (fasting). You may have this done every 1-3 years. Bone density scan. This is done to screen for osteoporosis. You may have this done starting at age 49. Mammogram. This may be done every 1-2 years. Talk to your health care provider about how often you should have regular mammograms. Talk with your health care provider about your test results, treatment options, and if necessary, the need for more tests. Vaccines  Your health care provider may recommend certain vaccines, such as: Influenza vaccine. This is recommended every year. Tetanus, diphtheria, and acellular pertussis (Tdap, Td) vaccine. You may need a Td booster every 10 years. Zoster vaccine. You may need this after age 44. Pneumococcal 13-valent conjugate (PCV13) vaccine. One dose is recommended after age 33. Pneumococcal polysaccharide (PPSV23) vaccine. One dose is recommended  after  age 12. Talk to your health care provider about which screenings and vaccines you need and how often you need them. This information is not intended to replace advice given to you by your health care provider. Make sure you discuss any questions you have with your health care provider. Document Released: 09/20/2015 Document Revised: 05/13/2016 Document Reviewed: 06/25/2015 Elsevier Interactive Patient Education  2017 [place] Prevention in the Home Falls can cause injuries. They can happen to people of all ages. There are many things you can do to make your home safe and to help prevent falls. What can I do on the outside of my home? Regularly fix the edges of walkways and driveways and fix any cracks. Remove anything that might make you trip as you walk through a door, such as a raised step or threshold. Trim any bushes or trees on the path to your home. Use bright outdoor lighting. Clear any walking paths of anything that might make someone trip, such as rocks or tools. Regularly check to see if handrails are loose or broken. Make sure that both sides of any steps have handrails. Any raised decks and porches should have guardrails on the edges. Have any leaves, snow, or ice cleared regularly. Use sand or salt on walking paths during winter. Clean up any spills in your garage right away. This includes oil or grease spills. What can I do in the bathroom? Use night lights. Install grab bars by the toilet and in the tub and shower. Do not use towel bars as grab bars. Use non-skid mats or decals in the tub or shower. If you need to sit down in the shower, use a plastic, non-slip stool. Keep the floor dry. Clean up any water that spills on the floor as soon as it happens. Remove soap buildup in the tub or shower regularly. Attach bath mats securely with double-sided non-slip rug tape. Do not have throw rugs and other things on the floor that can make you trip. What can I do  in the bedroom? Use night lights. Make sure that you have a light by your bed that is easy to reach. Do not use any sheets or blankets that are too big for your bed. They should not hang down onto the floor. Have a firm chair that has side arms. You can use this for support while you get dressed. Do not have throw rugs and other things on the floor that can make you trip. What can I do in the kitchen? Clean up any spills right away. Avoid walking on wet floors. Keep items that you use a lot in easy-to-reach places. If you need to reach something above you, use a strong step stool that has a grab bar. Keep electrical cords out of the way. Do not use floor polish or wax that makes floors slippery. If you must use wax, use non-skid floor wax. Do not have throw rugs and other things on the floor that can make you trip. What can I do with my stairs? Do not leave any items on the stairs. Make sure that there are handrails on both sides of the stairs and use them. Fix handrails that are broken or loose. Make sure that handrails are as long as the stairways. Check any carpeting to make sure that it is firmly attached to the stairs. Fix any carpet that is loose or worn. Avoid having throw rugs at the top or bottom of the stairs. If you do have  throw rugs, attach them to the floor with carpet tape. Make sure that you have a light switch at the top of the stairs and the bottom of the stairs. If you do not have them, ask someone to add them for you. What else can I do to help prevent falls? Wear shoes that: Do not have high heels. Have rubber bottoms. Are comfortable and fit you well. Are closed at the toe. Do not wear sandals. If you use a stepladder: Make sure that it is fully opened. Do not climb a closed stepladder. Make sure that both sides of the stepladder are locked into place. Ask someone to hold it for you, if possible. Clearly mark and make sure that you can see: Any grab bars or  handrails. First and last steps. Where the edge of each step is. Use tools that help you move around (mobility aids) if they are needed. These include: Canes. Walkers. Scooters. Crutches. Turn on the lights when you go into a dark area. Replace any light bulbs as soon as they burn out. Set up your furniture so you have a clear path. Avoid moving your furniture around. If any of your floors are uneven, fix them. If there are any pets around you, be aware of where they are. Review your medicines with your doctor. Some medicines can make you feel dizzy. This can increase your chance of falling. Ask your doctor what other things that you can do to help prevent falls. This information is not intended to replace advice given to you by your health care provider. Make sure you discuss any questions you have with your health care provider. Document Released: 06/20/2009 Document Revised: 01/30/2016 Document Reviewed: 09/28/2014 Elsevier Interactive Patient Education  2017 [employer].

## 2021-09-05 ENCOUNTER — Other Ambulatory Visit: Payer: Self-pay | Admitting: Family Medicine

## 2021-09-05 NOTE — Telephone Encounter (Signed)
Pt is scheduled for appointment

## 2021-09-09 ENCOUNTER — Telehealth: Payer: Self-pay | Admitting: Pharmacist

## 2021-09-09 NOTE — Chronic Care Management (AMB) (Addendum)
° ° °  Chronic Care Management Pharmacy Assistant   Name: KATHELEEN STELLA  MRN: [id number] DOB: [birth year]  Reason for Encounter: Medication Review / Medication Coordination Call   Conditions to be addressed/monitored: HTN  Recent office visits:  None  Recent consult visits:  None  Hospital visits:  None  Medications: Outpatient Encounter Medications as of 09/09/2021  Medication Sig   albuterol (PROAIR HFA) 108 (90 Base) MCG/ACT inhaler TAKE 2 PUFFS BY MOUTH EVERY 4 HOURS AS NEEDED   amLODipine (NORVASC) 5 MG tablet TAKE ONE TABLET BY MOUTH ONCE DAILY   ARMOUR THYROID 60 MG tablet TAKE ONE TABLET BY MOUTH ONCE DAILY   budesonide-formoterol (SYMBICORT) 80-4.5 MCG/ACT inhaler INHALE TWO PUFFS into THE lungs TWICE DAILY   Calcium Carbonate-Vit D-Min (CALCIUM 1200 PO) Take 1,200 mg by mouth daily.   Cholecalciferol 25 MCG (1000 UT) tablet Take 1,000 Units by mouth daily.   Coenzyme Q10 (CO Q 10 PO) Take 100 mg by mouth daily.    ipratropium-albuterol (DUONEB) 0.5-2.5 (3) MG/3ML SOLN Take 3 mLs by nebulization every 4 (four) hours as needed (asthma). (Patient not taking: Reported on 08/22/2020)   MAGNESIUM PO Take 450 mg by mouth at bedtime.    metoprolol succinate (TOPROL-XL) 100 MG 24 hr tablet TAKE ONE TABLET BY MOUTH ONCE DAILY WITH A MEAL   montelukast (SINGULAIR) 10 MG tablet TAKE ONE TABLET BY MOUTH ONCE DAILY   Multiple Vitamin (MULTIVITAMIN WITH MINERALS) TABS tablet Take 1 tablet by mouth daily.   Omega-3 Fatty Acids (FISH OIL PO) Take 1,300 mg by mouth daily.    omeprazole (PRILOSEC) 40 MG capsule TAKE 1 CAPSULE BY MOUTH EVERY DAY   No facility-administered encounter medications on file as of 09/09/2021.   Reviewed chart for medication changes ahead of medication coordination call.   BP Readings from Last 3 Encounters:  07/23/21 120/70  03/06/20 126/62  02/15/20 136/70    Lab Results  Component Value Date   HGBA1C 5.8 11/28/2009     Patient obtains medications  through Vials  90 Days   Last adherence delivery included: Metoprolol succinate (TOPROL-XL) 100 MG 24 hr- one tablet daily NP THYROID 60 mg: one tablet daily Montelukast (SINGULAIR) 10 mg: one tablet daily Amlodipine (NORVASC) 5 mg: one tablet daily  Patient declined (meds) last month: No medications declined.  Patient is due for next adherence delivery on: 09/17/2021.  Called patient and reviewed medications and coordinated delivery.  This delivery to include: Metoprolol succinate (TOPROL-XL) 100 MG 24 hr- one tablet daily NP THYROID 60 mg: one tablet daily Montelukast (SINGULAIR) 10 mg: one tablet daily Amlodipine (NORVASC) 5 mg: one tablet daily Symbicort 80-4.55mcg: two puffs twice daily Albuterol HFA as needed Temazepam 30 mg: one capsule at bedtime as needed  Patient will need a short fill: No short fill needed  Coordinated acute fill: No acute fill needed  Patient declined the following medications: No medications declined.  Confirmed delivery date of 09/17/2021, advised patient that pharmacy will contact them the morning of delivery.   Care Gaps: AWV - scheduled 08/24/2022 Covid 19 vacccine - overdue  Flu vaccine - due Last BP reading - 120/70 on 07/23/2021  Star Rating Drugs: None  Inetta Fermo [facility]  Clinical Pharmacist Assistant [phone]

## 2021-09-10 ENCOUNTER — Encounter: Payer: Self-pay | Admitting: Family Medicine

## 2021-09-10 ENCOUNTER — Ambulatory Visit (INDEPENDENT_AMBULATORY_CARE_PROVIDER_SITE_OTHER): Payer: PPO | Admitting: Family Medicine

## 2021-09-10 ENCOUNTER — Ambulatory Visit (INDEPENDENT_AMBULATORY_CARE_PROVIDER_SITE_OTHER): Payer: PPO

## 2021-09-10 VITALS — BP 122/70 | HR 78 | Temp 98.6°F | Ht 65.0 in

## 2021-09-10 DIAGNOSIS — Z1211 Encounter for screening for malignant neoplasm of colon: Secondary | ICD-10-CM

## 2021-09-10 DIAGNOSIS — E039 Hypothyroidism, unspecified: Secondary | ICD-10-CM

## 2021-09-10 DIAGNOSIS — I442 Atrioventricular block, complete: Secondary | ICD-10-CM | POA: Diagnosis not present

## 2021-09-10 DIAGNOSIS — Z23 Encounter for immunization: Secondary | ICD-10-CM | POA: Diagnosis not present

## 2021-09-10 DIAGNOSIS — Z0000 Encounter for general adult medical examination without abnormal findings: Secondary | ICD-10-CM | POA: Diagnosis not present

## 2021-09-10 LAB — CBC WITH DIFFERENTIAL/PLATELET
Basophils Absolute: 0.1 10*3/uL (ref 0.0–0.1)
Basophils Relative: 1.1 % (ref 0.0–3.0)
Eosinophils Absolute: 0.2 10*3/uL (ref 0.0–0.7)
Eosinophils Relative: 2.2 % (ref 0.0–5.0)
HCT: 40.4 % (ref 36.0–46.0)
Hemoglobin: 12.6 g/dL (ref 12.0–15.0)
Lymphocytes Relative: 26 % (ref 12.0–46.0)
Lymphs Abs: 2.4 10*3/uL (ref 0.7–4.0)
MCHC: 31.2 g/dL (ref 30.0–36.0)
MCV: 80.8 fl (ref 78.0–100.0)
Monocytes Absolute: 0.7 10*3/uL (ref 0.1–1.0)
Monocytes Relative: 7.6 % (ref 3.0–12.0)
Neutro Abs: 5.8 10*3/uL (ref 1.4–7.7)
Neutrophils Relative %: 63.1 % (ref 43.0–77.0)
Platelets: 394 10*3/uL (ref 150.0–400.0)
RBC: 4.99 Mil/uL (ref 3.87–5.11)
RDW: 15.5 % (ref 11.5–15.5)
WBC: 9.1 10*3/uL (ref 4.0–10.5)

## 2021-09-10 LAB — LIPID PANEL
Cholesterol: 224 mg/dL — ABNORMAL HIGH (ref 0–200)
HDL: 64.7 mg/dL (ref >39.00)
LDL Cholesterol: 133 mg/dL — ABNORMAL HIGH (ref 0–99)
NonHDL: 158.84
Total CHOL/HDL Ratio: 3
Triglycerides: 131 mg/dL (ref 0.0–149.0)
VLDL: 26.2 mg/dL (ref 0.0–40.0)

## 2021-09-10 LAB — CUP PACEART REMOTE DEVICE CHECK
Battery Remaining Longevity: 60 mo
Battery Remaining Percentage: 54 %
Battery Voltage: 2.98 V
Brady Statistic AP VP Percent: 6.2 %
Brady Statistic AP VS Percent: 21 %
Brady Statistic AS VP Percent: 47 %
Brady Statistic AS VS Percent: 26 %
Brady Statistic RA Percent Paced: 27 %
Brady Statistic RV Percent Paced: 53 %
Date Time Interrogation Session: 20230104020014
Implantable Lead Implant Date: 20171012
Implantable Lead Implant Date: 20171012
Implantable Lead Location: 753859
Implantable Lead Location: 753860
Implantable Pulse Generator Implant Date: 20171012
Lead Channel Impedance Value: 430 Ohm
Lead Channel Impedance Value: 560 Ohm
Lead Channel Pacing Threshold Amplitude: 0.5 V
Lead Channel Pacing Threshold Amplitude: 0.5 V
Lead Channel Pacing Threshold Pulse Width: 0.5 ms
Lead Channel Pacing Threshold Pulse Width: 0.5 ms
Lead Channel Sensing Intrinsic Amplitude: 2.3 mV
Lead Channel Sensing Intrinsic Amplitude: 4.8 mV
Lead Channel Setting Pacing Amplitude: 2 V
Lead Channel Setting Pacing Amplitude: 2.5 V
Lead Channel Setting Pacing Pulse Width: 0.5 ms
Lead Channel Setting Sensing Sensitivity: 2 mV
Pulse Gen Model: 2272
Pulse Gen Serial Number: 7955201

## 2021-09-10 LAB — HEPATIC FUNCTION PANEL
ALT: 20 U/L (ref 0–35)
AST: 19 U/L (ref 0–37)
Albumin: 4.2 g/dL (ref 3.5–5.2)
Alkaline Phosphatase: 82 U/L (ref 39–117)
Bilirubin, Direct: 0.1 mg/dL (ref 0.0–0.3)
Total Bilirubin: 0.4 mg/dL (ref 0.2–1.2)
Total Protein: 7.7 g/dL (ref 6.0–8.3)

## 2021-09-10 LAB — BASIC METABOLIC PANEL
BUN: 16 mg/dL (ref 6–23)
CO2: 24 mEq/L (ref 19–32)
Calcium: 9.6 mg/dL (ref 8.4–10.5)
Chloride: 105 mEq/L (ref 96–112)
Creatinine, Ser: 0.88 mg/dL (ref 0.40–1.20)
GFR: 65.22 mL/min (ref >60.00)
Glucose, Bld: 113 mg/dL — ABNORMAL HIGH (ref 70–99)
Potassium: 4.1 mEq/L (ref 3.5–5.1)
Sodium: 137 mEq/L (ref 135–145)

## 2021-09-10 LAB — T3, FREE: T3, Free: 3.8 pg/mL (ref 2.3–4.2)

## 2021-09-10 LAB — HEMOGLOBIN A1C: Hgb A1c MFr Bld: 6.3 % (ref 4.6–6.5)

## 2021-09-10 LAB — T4, FREE: Free T4: 0.69 ng/dL (ref 0.60–1.60)

## 2021-09-10 LAB — TSH: TSH: 3.59 u[IU]/mL (ref 0.35–5.50)

## 2021-09-10 MED ORDER — TEMAZEPAM 30 MG PO CAPS: 30.0000 mg | ORAL_CAPSULE | Freq: Every evening | ORAL | 1 refills | Status: DC | PRN

## 2021-09-10 MED ORDER — METOPROLOL SUCCINATE ER 100 MG PO TB24: ORAL_TABLET | ORAL | 3 refills | Status: DC

## 2021-09-10 MED ORDER — OMEPRAZOLE 40 MG PO CPDR: DELAYED_RELEASE_CAPSULE | ORAL | 3 refills | Status: DC

## 2021-09-10 MED ORDER — MONTELUKAST SODIUM 10 MG PO TABS: 10.0000 mg | ORAL_TABLET | Freq: Every day | ORAL | 3 refills | Status: DC

## 2021-09-10 MED ORDER — AMLODIPINE BESYLATE 5 MG PO TABS: 5.0000 mg | ORAL_TABLET | Freq: Every day | ORAL | 3 refills | Status: DC

## 2021-09-10 MED ORDER — ALBUTEROL SULFATE HFA 108 (90 BASE) MCG/ACT IN AERS: INHALATION_SPRAY | RESPIRATORY_TRACT | 5 refills | Status: DC

## 2021-09-10 MED ORDER — ARMOUR THYROID 60 MG PO TABS: 60.0000 mg | ORAL_TABLET | Freq: Every day | ORAL | 3 refills | Status: DC

## 2021-09-10 MED ORDER — BUDESONIDE-FORMOTEROL FUMARATE 80-4.5 MCG/ACT IN AERO: INHALATION_SPRAY | RESPIRATORY_TRACT | 3 refills | Status: DC

## 2021-09-10 NOTE — Addendum Note (Signed)
Addended by: Wyvonne Lenz on: 09/10/2021 12:21 PM   Modules accepted: Orders

## 2021-09-10 NOTE — Progress Notes (Signed)
° °  Subjective:    Patient ID: Whitney Evans, female    DOB: [birth year], 76 y.o.   MRN: [id number]  HPI Here for a well exam. She is doing well. Her PPM ICD seems to be working well and she is followed closely by Cardiology.    Review of Systems  Constitutional: Negative.   HENT: Negative.    Eyes: Negative.   Respiratory: Negative.    Cardiovascular: Negative.   Gastrointestinal: Negative.   Genitourinary:  Negative for decreased urine volume, difficulty urinating, dyspareunia, dysuria, enuresis, flank pain, frequency, hematuria, pelvic pain and urgency.  Musculoskeletal: Negative.   Skin: Negative.   Neurological: Negative.  Negative for headaches.  Psychiatric/Behavioral: Negative.        Objective:   Physical Exam Constitutional:      General: She is not in acute distress.    Appearance: Normal appearance. She is well-developed.  HENT:     Head: Normocephalic and atraumatic.     Right Ear: External ear normal.     Left Ear: External ear normal.     Nose: Nose normal.     Mouth/Throat:     Pharynx: No oropharyngeal exudate.  Eyes:     General: No scleral icterus.    Conjunctiva/sclera: Conjunctivae normal.     Pupils: Pupils are equal, round, and reactive to light.  Neck:     Thyroid: No thyromegaly.     Vascular: No JVD.  Cardiovascular:     Rate and Rhythm: Normal rate and regular rhythm.     Heart sounds: Normal heart sounds. No murmur heard.   No friction rub. No gallop.  Pulmonary:     Effort: Pulmonary effort is normal. No respiratory distress.     Breath sounds: Normal breath sounds. No wheezing or rales.  Chest:     Chest wall: No tenderness.  Abdominal:     General: Bowel sounds are normal. There is no distension.     Palpations: Abdomen is soft. There is no mass.     Tenderness: There is no abdominal tenderness. There is no guarding or rebound.  Musculoskeletal:        General: No tenderness. Normal range of motion.     Cervical back: Normal  range of motion and neck supple.  Lymphadenopathy:     Cervical: No cervical adenopathy.  Skin:    General: Skin is warm and dry.     Findings: No erythema or rash.  Neurological:     Mental Status: She is alert and oriented to person, place, and time.     Cranial Nerves: No cranial nerve deficit.     Motor: No abnormal muscle tone.     Coordination: Coordination normal.     Deep Tendon Reflexes: Reflexes are normal and symmetric. Reflexes normal.  Psychiatric:        Behavior: Behavior normal.        Thought Content: Thought content normal.        Judgment: Judgment normal.          Assessment & Plan:  Well exam. We discussed diet and exercise. Get fasting labs. She declines to vere get another colonoscopy so we have ordered a Cologuard test for her.  Alysia Penna, MD

## 2021-09-22 NOTE — Progress Notes (Signed)
Remote pacemaker transmission.   

## 2021-09-25 DIAGNOSIS — Z1211 Encounter for screening for malignant neoplasm of colon: Secondary | ICD-10-CM | POA: Diagnosis not present

## 2021-10-03 LAB — COLOGUARD: COLOGUARD: POSITIVE — AB

## 2021-10-06 NOTE — Addendum Note (Signed)
Addended by: Gershon Crane A on: 10/06/2021 07:54 AM   Modules accepted: Orders

## 2021-12-04 ENCOUNTER — Telehealth: Payer: Self-pay | Admitting: Pharmacist

## 2021-12-04 NOTE — Chronic Care Management (AMB) (Signed)
? ? ?Chronic Care Management ?Pharmacy Assistant  ? ?Name: Whitney Evans  MRN: [id number] DOB: November 11, 1945 ? ?Reason for Encounter: Medication Review / Medication Coordination Call ?  ?Conditions to be addressed/monitored: ?HTN ? ?Recent office visits:  ?09/10/2021 Gershon Crane MD - Patient was seen for Preventative health care and additional issues. Started Temazepam 30 mg at bedtime as needed. Discontinued Tyroid 60 mg daily. No follow up noted.  ? ?Recent consult visits:  ?None ? ?Hospital visits:  ?None ? ?Medications: ?Outpatient Encounter Medications as of 12/04/2021  ?Medication Sig  ? albuterol (PROAIR HFA) 108 (90 Base) MCG/ACT inhaler TAKE 2 PUFFS BY MOUTH EVERY 4 HOURS AS NEEDED  ? amLODipine (NORVASC) 5 MG tablet Take 1 tablet (5 mg total) by mouth daily.  ? ARMOUR THYROID 60 MG tablet Take 1 tablet (60 mg total) by mouth daily.  ? budesonide-formoterol (SYMBICORT) 80-4.5 MCG/ACT inhaler INHALE TWO PUFFS into THE lungs TWICE DAILY  ? Calcium Carbonate-Vit D-Min (CALCIUM 1200 PO) Take 1,200 mg by mouth daily.  ? Cholecalciferol 25 MCG (1000 UT) tablet Take 1,000 Units by mouth daily.  ? Coenzyme Q10 (CO Q 10 PO) Take 100 mg by mouth daily.   ? ipratropium-albuterol (DUONEB) 0.5-2.5 (3) MG/3ML SOLN Take 3 mLs by nebulization every 4 (four) hours as needed (asthma).  ? MAGNESIUM PO Take 450 mg by mouth at bedtime.   ? metoprolol succinate (TOPROL-XL) 100 MG 24 hr tablet TAKE ONE TABLET BY MOUTH ONCE DAILY WITH A MEAL  ? montelukast (SINGULAIR) 10 MG tablet Take 1 tablet (10 mg total) by mouth daily.  ? Multiple Vitamin (MULTIVITAMIN WITH MINERALS) TABS tablet Take 1 tablet by mouth daily.  ? Omega-3 Fatty Acids (FISH OIL PO) Take 1,300 mg by mouth daily.   ? omeprazole (PRILOSEC) 40 MG capsule TAKE 1 CAPSULE BY MOUTH EVERY DAY  ? temazepam (RESTORIL) 30 MG capsule Take 1 capsule (30 mg total) by mouth at bedtime as needed for sleep.  ? ?No facility-administered encounter medications on file as of  12/04/2021.  ? ?Reviewed chart for medication changes ahead of medication coordination call. ? ?No OVs, Consults, or hospital visits since last care coordination call/Pharmacist visit. (If appropriate, list visit date, provider name) ? ?No medication changes indicated OR if recent visit, treatment plan here. ? ?BP Readings from Last 3 Encounters:  ?09/10/21 122/70  ?07/23/21 120/70  ?03/06/20 126/62  ?  ?Lab Results  ?Component Value Date  ? HGBA1C 6.3 09/10/2021  ?  ? ?Patient obtains medications through Vials  90 Days  ?  ?Last adherence delivery included: ?Metoprolol succinate (TOPROL-XL) 100 MG 24 hr- one tablet daily ?NP THYROID 60 mg: one tablet daily ?Montelukast (SINGULAIR) 10 mg: one tablet daily ?Amlodipine (NORVASC) 5 mg: one tablet daily ?Symbicort 80-4.38mcg: two puffs twice daily ?Albuterol HFA as needed ?Temazepam 30 mg: one capsule at bedtime as needed ?  ?Patient declined (meds) last month: No medications declined. ?  ?Patient is due for next adherence delivery on: 12/16/2021. ?  ?Called patient and reviewed medications and coordinated delivery. ?  ?This delivery to include: ?Metoprolol succinate (TOPROL-XL) 100 MG 24 hr- one tablet daily ?Montelukast (SINGULAIR) 10 mg: one tablet daily ?Amlodipine (NORVASC) 5 mg: one tablet daily ?Symbicort 80-4.65mcg: two puffs twice daily ?Albuterol HFA as needed ?Temazepam 30 mg: one capsule at bedtime as needed ?Armour Thyroid 60 mg daily ?  ?Patient will need a short fill: No short fill needed ?  ?Coordinated acute fill: No acute fill needed ?  ?  Patient declined the following medications: No medications declined ? ?Confirmed delivery date of 12/16/2021, advised patient that pharmacy will contact them the morning of delivery. ?  ?  ?Care Gaps: ?AWV - scheduled 08/24/2022 ?Last BP reading - 122/70 on 09/10/2021 ?Last A1C - 6.3 on 09/10/2021 ?Covid booster - overdue  ?  ?Star Rating Drugs: ?None ? ?Inetta Fermo CMA  ?Clinical Pharmacist Assistant ?[phone] ? ?

## 2021-12-10 ENCOUNTER — Ambulatory Visit (INDEPENDENT_AMBULATORY_CARE_PROVIDER_SITE_OTHER): Payer: PPO

## 2021-12-10 DIAGNOSIS — I442 Atrioventricular block, complete: Secondary | ICD-10-CM

## 2021-12-10 LAB — CUP PACEART REMOTE DEVICE CHECK
Battery Remaining Longevity: 57 mo
Battery Remaining Percentage: 51 %
Battery Voltage: 2.98 V
Brady Statistic AP VP Percent: 6.5 %
Brady Statistic AP VS Percent: 19 %
Brady Statistic AS VP Percent: 52 %
Brady Statistic AS VS Percent: 22 %
Brady Statistic RA Percent Paced: 25 %
Brady Statistic RV Percent Paced: 59 %
Date Time Interrogation Session: 20230405034904
Implantable Lead Implant Date: 20171012
Implantable Lead Implant Date: 20171012
Implantable Lead Location: 753859
Implantable Lead Location: 753860
Implantable Pulse Generator Implant Date: 20171012
Lead Channel Impedance Value: 410 Ohm
Lead Channel Impedance Value: 550 Ohm
Lead Channel Pacing Threshold Amplitude: 0.5 V
Lead Channel Pacing Threshold Amplitude: 0.5 V
Lead Channel Pacing Threshold Pulse Width: 0.5 ms
Lead Channel Pacing Threshold Pulse Width: 0.5 ms
Lead Channel Sensing Intrinsic Amplitude: 2.1 mV
Lead Channel Sensing Intrinsic Amplitude: 4.1 mV
Lead Channel Setting Pacing Amplitude: 2 V
Lead Channel Setting Pacing Amplitude: 2.5 V
Lead Channel Setting Pacing Pulse Width: 0.5 ms
Lead Channel Setting Sensing Sensitivity: 2 mV
Pulse Gen Model: 2272
Pulse Gen Serial Number: 7955201

## 2021-12-25 NOTE — Progress Notes (Signed)
Remote pacemaker transmission.   

## 2022-03-04 ENCOUNTER — Telehealth: Payer: Self-pay | Admitting: Pharmacist

## 2022-03-04 NOTE — Chronic Care Management (AMB) (Unsigned)
Chronic Care Management Pharmacy Assistant   Name: Whitney Evans  MRN: [id number] DOB: [birth year]  Reason for Encounter: Medication Review / Medication Coordination Call  Recent office visits:  None  Recent consult visits:  None  Hospital visits:  None  Medications: Outpatient Encounter Medications as of 03/04/2022  Medication Sig   albuterol (PROAIR HFA) 108 (90 Base) MCG/ACT inhaler TAKE 2 PUFFS BY MOUTH EVERY 4 HOURS AS NEEDED   amLODipine (NORVASC) 5 MG tablet Take 1 tablet (5 mg total) by mouth daily.   ARMOUR THYROID 60 MG tablet Take 1 tablet (60 mg total) by mouth daily.   budesonide-formoterol (SYMBICORT) 80-4.5 MCG/ACT inhaler INHALE TWO PUFFS into THE lungs TWICE DAILY   Calcium Carbonate-Vit D-Min (CALCIUM 1200 PO) Take 1,200 mg by mouth daily.   Cholecalciferol 25 MCG (1000 UT) tablet Take 1,000 Units by mouth daily.   Coenzyme Q10 (CO Q 10 PO) Take 100 mg by mouth daily.    ipratropium-albuterol (DUONEB) 0.5-2.5 (3) MG/3ML SOLN Take 3 mLs by nebulization every 4 (four) hours as needed (asthma).   MAGNESIUM PO Take 450 mg by mouth at bedtime.    metoprolol succinate (TOPROL-XL) 100 MG 24 hr tablet TAKE ONE TABLET BY MOUTH ONCE DAILY WITH A MEAL   montelukast (SINGULAIR) 10 MG tablet Take 1 tablet (10 mg total) by mouth daily.   Multiple Vitamin (MULTIVITAMIN WITH MINERALS) TABS tablet Take 1 tablet by mouth daily.   Omega-3 Fatty Acids (FISH OIL PO) Take 1,300 mg by mouth daily.    omeprazole (PRILOSEC) 40 MG capsule TAKE 1 CAPSULE BY MOUTH EVERY DAY   temazepam (RESTORIL) 30 MG capsule Take 1 capsule (30 mg total) by mouth at bedtime as needed for sleep.   No facility-administered encounter medications on file as of 03/04/2022.  Reviewed chart for medication changes ahead of medication coordination call.  No OVs, Consults, or hospital visits since last care coordination call/Pharmacist visit. (If appropriate, list visit date, provider name)  No medication  changes indicated OR if recent visit, treatment plan here.  BP Readings from Last 3 Encounters:  09/10/21 122/70  07/23/21 120/70  03/06/20 126/62    Lab Results  Component Value Date   HGBA1C 6.3 09/10/2021     Patient obtains medications through Vials  90 Days    Last adherence delivery included: Metoprolol succinate (TOPROL-XL) 100 MG 24 hr- one tablet daily Montelukast (SINGULAIR) 10 mg: one tablet daily Symbicort 80-4.23mcg: two puffs twice daily Armour Thyroid 60 mg daily Amlodipine (NORVASC) 5 mg: one tablet daily Armour Thyroid 60 mg daily Temazepam 30 mg: one capsule at bedtime as needed Albuterol HFA as needed Patient declined (meds) last month: No medications declined.   Patient is due for next adherence delivery on: 03/17/2022   Called patient and reviewed medications and coordinated delivery.   This delivery to include: Metoprolol succinate (TOPROL-XL) 100 MG 24 hr- one tablet daily Montelukast (SINGULAIR) 10 mg: one tablet daily Symbicort 80-4.7mcg: two puffs twice daily Armour Thyroid 60 mg daily Amlodipine (NORVASC) 5 mg: one tablet daily Temazepam 30 mg: one capsule at bedtime as needed Albuterol HFA as needed   Patient will need a short fill: No short fill needed   Coordinated acute fill: No acute fill needed   Patient declined the following medications: No medications declined   Confirmed delivery date of 03/17/2022, advised patient that pharmacy will contact them the morning of delivery.  Care Gaps: AWV - scheduled 08/24/2022 Last BP - 122/70 on  09/10/2021 Last A1C - 6.3 on 09/10/2021 Covid booster - overdue Pneumonia - postponed Mammogram - postponed Colonoscopy - postponed  Star Rating Drugs: None  Inetta Fermo [facility]  Clinical Pharmacist Assistant [phone]

## 2022-03-05 ENCOUNTER — Other Ambulatory Visit: Payer: Self-pay | Admitting: Family Medicine

## 2022-03-05 NOTE — Telephone Encounter (Signed)
In Dr Clent Ridges absence will refill 30 days   Further refills  should come from pcp

## 2022-03-05 NOTE — Telephone Encounter (Signed)
Last Rx given on 09/10/2021 for #90 with 1 ref

## 2022-03-11 ENCOUNTER — Ambulatory Visit (INDEPENDENT_AMBULATORY_CARE_PROVIDER_SITE_OTHER): Payer: PPO

## 2022-03-11 DIAGNOSIS — I442 Atrioventricular block, complete: Secondary | ICD-10-CM | POA: Diagnosis not present

## 2022-03-13 ENCOUNTER — Other Ambulatory Visit: Payer: Self-pay | Admitting: Family Medicine

## 2022-03-14 LAB — CUP PACEART REMOTE DEVICE CHECK
Battery Remaining Longevity: 53 mo
Battery Remaining Percentage: 49 %
Battery Voltage: 2.98 V
Brady Statistic AP VP Percent: 7.9 %
Brady Statistic AP VS Percent: 17 %
Brady Statistic AS VP Percent: 55 %
Brady Statistic AS VS Percent: 20 %
Brady Statistic RA Percent Paced: 24 %
Brady Statistic RV Percent Paced: 63 %
Date Time Interrogation Session: 20230705020035
Implantable Lead Implant Date: 20171012
Implantable Lead Implant Date: 20171012
Implantable Lead Location: 753859
Implantable Lead Location: 753860
Implantable Pulse Generator Implant Date: 20171012
Lead Channel Impedance Value: 400 Ohm
Lead Channel Impedance Value: 560 Ohm
Lead Channel Pacing Threshold Amplitude: 0.5 V
Lead Channel Pacing Threshold Amplitude: 0.5 V
Lead Channel Pacing Threshold Pulse Width: 0.5 ms
Lead Channel Pacing Threshold Pulse Width: 0.5 ms
Lead Channel Sensing Intrinsic Amplitude: 2.1 mV
Lead Channel Sensing Intrinsic Amplitude: 4 mV
Lead Channel Setting Pacing Amplitude: 2 V
Lead Channel Setting Pacing Amplitude: 2.5 V
Lead Channel Setting Pacing Pulse Width: 0.5 ms
Lead Channel Setting Sensing Sensitivity: 2 mV
Pulse Gen Model: 2272
Pulse Gen Serial Number: 7955201

## 2022-03-16 MED ORDER — TEMAZEPAM 30 MG PO CAPS: 30.0000 mg | ORAL_CAPSULE | Freq: Every day | ORAL | 5 refills | Status: DC

## 2022-03-16 NOTE — Telephone Encounter (Signed)
Last refill- 03/05/22- 30 tabs, 0 refills Last OV-09/10/21  No future OV scheduled.

## 2022-04-01 NOTE — Progress Notes (Signed)
Remote pacemaker transmission.   

## 2022-05-18 ENCOUNTER — Telehealth: Payer: PPO

## 2022-06-03 ENCOUNTER — Telehealth: Payer: Self-pay | Admitting: Pharmacist

## 2022-06-03 MED ORDER — TEMAZEPAM 30 MG PO CAPS: 30.0000 mg | ORAL_CAPSULE | Freq: Every day | ORAL | 1 refills | Status: DC

## 2022-06-03 NOTE — Telephone Encounter (Signed)
Done

## 2022-06-03 NOTE — Chronic Care Management (AMB) (Signed)
Chronic Care Management Pharmacy Assistant   Name: Whitney Evans  MRN: [id number] DOB: 31-Dec-1945  Reason for Encounter: Medication Review / Medication Coordination Call  Recent office visits:  None  Recent consult visits:  03/11/2022 Simone Curia, RN [facility]) - Patient was seen for Complete AV block. No additional chart notes.   Hospital visits:  None  Medications: Outpatient Encounter Medications as of 06/03/2022  Medication Sig   albuterol (PROAIR HFA) 108 (90 Base) MCG/ACT inhaler TAKE 2 PUFFS BY MOUTH EVERY 4 HOURS AS NEEDED   amLODipine (NORVASC) 5 MG tablet Take 1 tablet (5 mg total) by mouth daily.   ARMOUR THYROID 60 MG tablet Take 1 tablet (60 mg total) by mouth daily.   budesonide-formoterol (SYMBICORT) 80-4.5 MCG/ACT inhaler INHALE TWO PUFFS into THE lungs TWICE DAILY   Calcium Carbonate-Vit D-Min (CALCIUM 1200 PO) Take 1,200 mg by mouth daily.   Cholecalciferol 25 MCG (1000 UT) tablet Take 1,000 Units by mouth daily.   Coenzyme Q10 (CO Q 10 PO) Take 100 mg by mouth daily.    ipratropium-albuterol (DUONEB) 0.5-2.5 (3) MG/3ML SOLN Take 3 mLs by nebulization every 4 (four) hours as needed (asthma).   MAGNESIUM PO Take 450 mg by mouth at bedtime.    metoprolol succinate (TOPROL-XL) 100 MG 24 hr tablet TAKE ONE TABLET BY MOUTH ONCE DAILY WITH A MEAL   montelukast (SINGULAIR) 10 MG tablet Take 1 tablet (10 mg total) by mouth daily.   Multiple Vitamin (MULTIVITAMIN WITH MINERALS) TABS tablet Take 1 tablet by mouth daily.   Omega-3 Fatty Acids (FISH OIL PO) Take 1,300 mg by mouth daily.    omeprazole (PRILOSEC) 40 MG capsule TAKE 1 CAPSULE BY MOUTH EVERY DAY   temazepam (RESTORIL) 30 MG capsule Take 1 capsule (30 mg total) by mouth at bedtime.   No facility-administered encounter medications on file as of 06/03/2022.   Reviewed chart for medication changes ahead of medication coordination call.  BP Readings from Last 3 Encounters:  09/10/21 122/70   07/23/21 120/70  03/06/20 126/62    Lab Results  Component Value Date   HGBA1C 6.3 09/10/2021     Patient obtains medications through Vials  90 Days    Last adherence delivery included: Metoprolol succinate (TOPROL-XL) 100 MG 24 hr- one tablet daily Montelukast (SINGULAIR) 10 mg: one tablet daily Symbicort 80-4.63mcg: two puffs twice daily Armour Thyroid 60 mg daily Amlodipine (NORVASC) 5 mg: one tablet daily Temazepam 30 mg: one capsule at bedtime as needed Albuterol HFA as needed Patient declined (meds) last month: No medications declined.   Patient is due for next adherence delivery on: 06/15/2022   Called patient and reviewed medications and coordinated delivery.   This delivery to include: Metoprolol succinate 100 MG 24 hr- one tablet daily Montelukast 10 mg - one tablet daily Armour Thyroid 60 mg daily Amlodipine 5 mg - one tablet daily Temazepam 30 mg - one capsule at bedtime as needed Omeprazole 40 mg - one capsule daily   Patient will need a short fill: No short fill needed   Coordinated acute fill: No acute fill needed   Patient declined the following medications: patient has plenty on hand. Albuterol HFA as needed Symbicort 80-4.5 mcg - two puffs twice daily  Confirmed delivery date of 06/15/2022, advised patient that pharmacy will contact them the morning of delivery.  Care Gaps: AWV - scheduled 08/24/2022 Last BP - 122/70 on 09/10/2021 Last A1C - 6.3 on 09/10/2021 Covid booster - overdue Flu -  overdue Pneumonia vaccine - postponed Mammogram - postponed Colonoscopy - postponed  Star Rating Drugs: None  Inetta Fermo [facility]  Clinical Pharmacist Assistant [phone]

## 2022-06-03 NOTE — Telephone Encounter (Signed)
-----   Message from Viona Gilmore, [facility] sent at 06/03/2022 10:20 AM EDT ----- Regarding: Temazepam refill question Hi,  Would you be able to send in a temazepam refill for 90 days supply at a time to [facility]? I know she isn't due for a refill yet but she wanted to know because the rest of her meds all come as 90 days at a time.  Let me know! Thanks, Maddie

## 2022-06-10 ENCOUNTER — Ambulatory Visit (INDEPENDENT_AMBULATORY_CARE_PROVIDER_SITE_OTHER): Payer: PPO

## 2022-06-10 DIAGNOSIS — I442 Atrioventricular block, complete: Secondary | ICD-10-CM

## 2022-06-10 LAB — CUP PACEART REMOTE DEVICE CHECK
Battery Remaining Longevity: 50 mo
Battery Remaining Percentage: 46 %
Battery Voltage: 2.96 V
Brady Statistic AP VP Percent: 9.3 %
Brady Statistic AP VS Percent: 15 %
Brady Statistic AS VP Percent: 58 %
Brady Statistic AS VS Percent: 18 %
Brady Statistic RA Percent Paced: 24 %
Brady Statistic RV Percent Paced: 67 %
Date Time Interrogation Session: 20231004020018
Implantable Lead Implant Date: 20171012
Implantable Lead Implant Date: 20171012
Implantable Lead Location: 753859
Implantable Lead Location: 753860
Implantable Pulse Generator Implant Date: 20171012
Lead Channel Impedance Value: 410 Ohm
Lead Channel Impedance Value: 560 Ohm
Lead Channel Pacing Threshold Amplitude: 0.5 V
Lead Channel Pacing Threshold Amplitude: 0.5 V
Lead Channel Pacing Threshold Pulse Width: 0.5 ms
Lead Channel Pacing Threshold Pulse Width: 0.5 ms
Lead Channel Sensing Intrinsic Amplitude: 1.2 mV
Lead Channel Sensing Intrinsic Amplitude: 4 mV
Lead Channel Setting Pacing Amplitude: 2 V
Lead Channel Setting Pacing Amplitude: 2.5 V
Lead Channel Setting Pacing Pulse Width: 0.5 ms
Lead Channel Setting Sensing Sensitivity: 2 mV
Pulse Gen Model: 2272
Pulse Gen Serial Number: 7955201

## 2022-06-19 NOTE — Progress Notes (Signed)
Remote pacemaker transmission.   

## 2022-08-24 ENCOUNTER — Ambulatory Visit: Payer: PPO

## 2022-08-25 ENCOUNTER — Ambulatory Visit (INDEPENDENT_AMBULATORY_CARE_PROVIDER_SITE_OTHER): Payer: PPO

## 2022-08-25 VITALS — Ht 65.0 in | Wt 190.0 lb

## 2022-08-25 DIAGNOSIS — Z0000 Encounter for general adult medical examination without abnormal findings: Secondary | ICD-10-CM | POA: Diagnosis not present

## 2022-08-25 NOTE — Patient Instructions (Addendum)
[name] , Thank you for taking time to come for your Medicare Wellness Visit. I appreciate your ongoing commitment to your health goals. Please review the following plan we discussed and let me know if I can assist you in the future.   These are the goals we discussed:  Goals       Exercise 150 min/wk Moderate Activity      Get off of some of my medications      Lose weight (pt-stated)      Continue to lose weight.      Patient Stated      08/18/2021, more exercise      Weight (lb) < 140 lb (63.5 kg)        This is a list of the screening recommended for you and due dates:  Health Maintenance  Topic Date Due   COVID-19 Vaccine (4 - 2023-24 season) 09/10/2022*   Flu Shot  12/06/2022*   Pneumonia Vaccine (1 - PCV) 08/26/2023*   Mammogram  08/26/2023*   Colon Cancer Screening  08/26/2023*   Medicare Annual Wellness Visit  08/26/2023   DTaP/Tdap/Td vaccine (2 - Td or Tdap) 09/22/2025   DEXA scan (bone density measurement)  Completed   Hepatitis C Screening: USPSTF Recommendation to screen - Ages 48-79 yo.  Completed   Zoster (Shingles) Vaccine  Completed   HPV Vaccine  Aged Out  *Topic was postponed. The date shown is not the original due date.    Advanced directives: Please bring a copy of your health care power of attorney and living will to the office to be added to your chart at your convenience.   Conditions/risks identified: None  Next appointment: Follow up in one year for your annual wellness visit    Preventive Care 65 Years and Older, Female Preventive care refers to lifestyle choices and visits with your health care provider that can promote health and wellness. What does preventive care include? A yearly physical exam. This is also called an annual well check. Dental exams once or twice a year. Routine eye exams. Ask your health care provider how often you should have your eyes checked. Personal lifestyle choices, including: Daily care of your teeth and  gums. Regular physical activity. Eating a healthy diet. Avoiding tobacco and drug use. Limiting alcohol use. Practicing safe sex. Taking low-dose aspirin every day. Taking vitamin and mineral supplements as recommended by your health care provider. What happens during an annual well check? The services and screenings done by your health care provider during your annual well check will depend on your age, overall health, lifestyle risk factors, and family history of disease. Counseling  Your health care provider may ask you questions about your: Alcohol use. Tobacco use. Drug use. Emotional well-being. Home and relationship well-being. Sexual activity. Eating habits. History of falls. Memory and ability to understand (cognition). Work and work Astronomer. Reproductive health. Screening  You may have the following tests or measurements: Height, weight, and BMI. Blood pressure. Lipid and cholesterol levels. These may be checked every 5 years, or more frequently if you are over 86 years old. Skin check. Lung cancer screening. You may have this screening every year starting at age 22 if you have a 30-pack-year history of smoking and currently smoke or have quit within the past 15 years. Fecal occult blood test (FOBT) of the stool. You may have this test every year starting at age 18. Flexible sigmoidoscopy or colonoscopy. You may have a sigmoidoscopy every 5 years or  a colonoscopy every 10 years starting at age 60. Hepatitis C blood test. Hepatitis B blood test. Sexually transmitted disease (STD) testing. Diabetes screening. This is done by checking your blood sugar (glucose) after you have not eaten for a while (fasting). You may have this done every 1-3 years. Bone density scan. This is done to screen for osteoporosis. You may have this done starting at age 26. Mammogram. This may be done every 1-2 years. Talk to your health care provider about how often you should have regular  mammograms. Talk with your health care provider about your test results, treatment options, and if necessary, the need for more tests. Vaccines  Your health care provider may recommend certain vaccines, such as: Influenza vaccine. This is recommended every year. Tetanus, diphtheria, and acellular pertussis (Tdap, Td) vaccine. You may need a Td booster every 10 years. Zoster vaccine. You may need this after age 41. Pneumococcal 13-valent conjugate (PCV13) vaccine. One dose is recommended after age 17. Pneumococcal polysaccharide (PPSV23) vaccine. One dose is recommended after age 18. Talk to your health care provider about which screenings and vaccines you need and how often you need them. This information is not intended to replace advice given to you by your health care provider. Make sure you discuss any questions you have with your health care provider. Document Released: 09/20/2015 Document Revised: 05/13/2016 Document Reviewed: 06/25/2015 Elsevier Interactive Patient Education  2017 [employer] Prevention in the Home Falls can cause injuries. They can happen to people of all ages. There are many things you can do to make your home safe and to help prevent falls. What can I do on the outside of my home? Regularly fix the edges of walkways and driveways and fix any cracks. Remove anything that might make you trip as you walk through a door, such as a raised step or threshold. Trim any bushes or trees on the path to your home. Use bright outdoor lighting. Clear any walking paths of anything that might make someone trip, such as rocks or tools. Regularly check to see if handrails are loose or broken. Make sure that both sides of any steps have handrails. Any raised decks and porches should have guardrails on the edges. Have any leaves, snow, or ice cleared regularly. Use sand or salt on walking paths during winter. Clean up any spills in your garage right away. This includes oil  or grease spills. What can I do in the bathroom? Use night lights. Install grab bars by the toilet and in the tub and shower. Do not use towel bars as grab bars. Use non-skid mats or decals in the tub or shower. If you need to sit down in the shower, use a plastic, non-slip stool. Keep the floor dry. Clean up any water that spills on the floor as soon as it happens. Remove soap buildup in the tub or shower regularly. Attach bath mats securely with double-sided non-slip rug tape. Do not have throw rugs and other things on the floor that can make you trip. What can I do in the bedroom? Use night lights. Make sure that you have a light by your bed that is easy to reach. Do not use any sheets or blankets that are too big for your bed. They should not hang down onto the floor. Have a firm chair that has side arms. You can use this for support while you get dressed. Do not have throw rugs and other things on the floor that  can make you trip. What can I do in the kitchen? Clean up any spills right away. Avoid walking on wet floors. Keep items that you use a lot in easy-to-reach places. If you need to reach something above you, use a strong step stool that has a grab bar. Keep electrical cords out of the way. Do not use floor polish or wax that makes floors slippery. If you must use wax, use non-skid floor wax. Do not have throw rugs and other things on the floor that can make you trip. What can I do with my stairs? Do not leave any items on the stairs. Make sure that there are handrails on both sides of the stairs and use them. Fix handrails that are broken or loose. Make sure that handrails are as long as the stairways. Check any carpeting to make sure that it is firmly attached to the stairs. Fix any carpet that is loose or worn. Avoid having throw rugs at the top or bottom of the stairs. If you do have throw rugs, attach them to the floor with carpet tape. Make sure that you have a light  switch at the top of the stairs and the bottom of the stairs. If you do not have them, ask someone to add them for you. What else can I do to help prevent falls? Wear shoes that: Do not have high heels. Have rubber bottoms. Are comfortable and fit you well. Are closed at the toe. Do not wear sandals. If you use a stepladder: Make sure that it is fully opened. Do not climb a closed stepladder. Make sure that both sides of the stepladder are locked into place. Ask someone to hold it for you, if possible. Clearly mark and make sure that you can see: Any grab bars or handrails. First and last steps. Where the edge of each step is. Use tools that help you move around (mobility aids) if they are needed. These include: Canes. Walkers. Scooters. Crutches. Turn on the lights when you go into a dark area. Replace any light bulbs as soon as they burn out. Set up your furniture so you have a clear path. Avoid moving your furniture around. If any of your floors are uneven, fix them. If there are any pets around you, be aware of where they are. Review your medicines with your doctor. Some medicines can make you feel dizzy. This can increase your chance of falling. Ask your doctor what other things that you can do to help prevent falls. This information is not intended to replace advice given to you by your health care provider. Make sure you discuss any questions you have with your health care provider. Document Released: 06/20/2009 Document Revised: 01/30/2016 Document Reviewed: 09/28/2014 Elsevier Interactive Patient Education  2017 [employer].

## 2022-08-25 NOTE — Progress Notes (Signed)
Subjective:   Whitney Evans is a 76 y.o. female who presents for Medicare Annual (Subsequent) preventive examination.  Review of Systems    Virtual Visit via Telephone Note  I connected with  Whitney Evans on 08/25/22 at  8:15 AM EST by telephone and verified that I am speaking with the correct person using two identifiers.  Location: Patient: Home Provider: Office Persons participating in the virtual visit: patient/Nurse Health Advisor   I discussed the limitations, risks, security and privacy concerns of performing an evaluation and management service by telephone and the availability of in person appointments. The patient expressed understanding and agreed to proceed.  Interactive audio and video telecommunications were attempted between this nurse and patient, however failed, due to patient having technical difficulties OR patient did not have access to video capability.  We continued and completed visit with audio only.  Some vital signs may be absent or patient reported.   Whitney RungBeverly W Kiira Brach, LPN  Cardiac Risk Factors include: advanced age (>6255men, 12>65 women);hypertension     Objective:    Today's Vitals   08/25/22 0819  Weight: 190 lb (86.2 kg)  Height: 5\' 5"  (1.651 m)   Body mass index is 31.62 kg/m.     08/25/2022    8:26 AM 08/18/2021    2:31 PM 08/22/2020    8:29 AM 08/21/2019    8:38 AM 03/25/2017   10:22 AM 06/17/2016   10:00 PM 06/17/2016    5:47 PM  Advanced Directives  Does Patient Have a Medical Advance Directive? Yes No Yes No Yes Yes Yes  Type of Estate agentAdvance Directive Healthcare Power of GrayAttorney;Living will    Healthcare Power of LamarAttorney;Living will  Healthcare Power of Attorney  Does patient want to make changes to medical advance directive?   Yes (MAU/Ambulatory/Procedural Areas - Information given)  No - Patient declined  No - Patient declined  Copy of Healthcare Power of Attorney in Chart? No - copy requested    No - copy requested  No -  copy requested  Would patient like information on creating a medical advance directive?    Yes (MAU/Ambulatory/Procedural Areas - Information given)       Current Medications (verified) Outpatient Encounter Medications as of 08/25/2022  Medication Sig   albuterol (PROAIR HFA) 108 (90 Base) MCG/ACT inhaler TAKE 2 PUFFS BY MOUTH EVERY 4 HOURS AS NEEDED   amLODipine (NORVASC) 5 MG tablet Take 1 tablet (5 mg total) by mouth daily.   ARMOUR THYROID 60 MG tablet Take 1 tablet (60 mg total) by mouth daily.   budesonide-formoterol (SYMBICORT) 80-4.5 MCG/ACT inhaler INHALE TWO PUFFS into THE lungs TWICE DAILY   Calcium Carbonate-Vit D-Min (CALCIUM 1200 PO) Take 1,200 mg by mouth daily.   Cholecalciferol 25 MCG (1000 UT) tablet Take 1,000 Units by mouth daily.   Coenzyme Q10 (CO Q 10 PO) Take 100 mg by mouth daily.    ipratropium-albuterol (DUONEB) 0.5-2.5 (3) MG/3ML SOLN Take 3 mLs by nebulization every 4 (four) hours as needed (asthma).   MAGNESIUM PO Take 450 mg by mouth at bedtime.    metoprolol succinate (TOPROL-XL) 100 MG 24 hr tablet TAKE ONE TABLET BY MOUTH ONCE DAILY WITH A MEAL   montelukast (SINGULAIR) 10 MG tablet Take 1 tablet (10 mg total) by mouth daily.   Multiple Vitamin (MULTIVITAMIN WITH MINERALS) TABS tablet Take 1 tablet by mouth daily.   Omega-3 Fatty Acids (FISH OIL PO) Take 1,300 mg by mouth daily.    omeprazole (  PRILOSEC) 40 MG capsule TAKE 1 CAPSULE BY MOUTH EVERY DAY   temazepam (RESTORIL) 30 MG capsule Take 1 capsule (30 mg total) by mouth at bedtime.   No facility-administered encounter medications on file as of 08/25/2022.    Allergies (verified) Morphine sulfate and Pneumococcal vaccine polyvalent   History: Past Medical History:  Diagnosis Date   Allergic rhinitis    Asthma    Chronic hoarseness    sees Dr. [name] (ENT) in [facility]    Essential hypertension    GERD (gastroesophageal reflux disease)    Hypothyroidism    Insomnia    Neck pain, chronic     Past Surgical History:  Procedure Laterality Date   CESAREAN SECTION     x 2   COLONOSCOPY  05/24/07   per Dr. Russella Dar, benign polpys, and diverticulosis, repeat 5 years    EP IMPLANTABLE DEVICE N/A 06/18/2016   Procedure: Pacemaker Implant;  Surgeon: Will [name], MD;  Location: [facility] INVASIVE CV [facility];  Service: Cardiovascular;  Laterality: N/A;   EYE SURGERY     left foot surgery     VESICOVAGINAL FISTULA CLOSURE W/ TAH     WISDOM TOOTH EXTRACTION     Family History  Problem Relation Age of Onset   Coronary artery disease Other    Diabetes Other    Hyperlipidemia Other    Hypertension Other    Stroke Other    Stroke Mother    Heart attack Father 71   Social History   Socioeconomic History   Marital status: Divorced    Spouse name: Not on file   Number of children: 2   Years of education: 4 years college   Highest education level: Bachelor's degree (e.g., BA, AB, BS)  Occupational History   Occupation: retired respiratory therapist  Tobacco Use   Smoking status: Former   Smokeless tobacco: Never  Building services engineer Use: Never used  Substance and Sexual Activity   Alcohol use: Yes    Alcohol/week: 2.0 standard drinks of alcohol    Types: 2 Standard drinks or equivalent per week    Comment: glass of red wine   Drug use: No   Sexual activity: Not on file  Other Topics Concern   Not on file  Social History Narrative   Retired respiratory therapist; divorced   1 daughter [place], [place] ICU nurse   1 son in [place] - Acupuncturist   Social Determinants of Health   Financial Resource Strain: Low Risk  (08/25/2022)   Overall Financial Resource Strain (CARDIA)    Difficulty of Paying Living Expenses: Not hard at all  Food Insecurity: No Food Insecurity (08/25/2022)   Hunger Vital Sign    Worried About Running Out of Food in the Last Year: Never true    Ran Out of Food in the Last Year: Never true  Transportation Needs: No Transportation Needs  (08/25/2022)   PRAPARE - Administrator, Civil Service (Medical): No    Lack of Transportation (Non-Medical): No  Physical Activity: Inactive (08/25/2022)   Exercise Vital Sign    Days of Exercise per Week: 0 days    Minutes of Exercise per Session: 0 min  Stress: No Stress Concern Present (08/25/2022)   [employer] of Occupational Health - Occupational Stress Questionnaire    Feeling of Stress : Not at all  Social Connections: Moderately Integrated (08/25/2022)   Social Connection and Isolation Panel [NHANES]    Frequency of Communication with Friends and  Family: More than three times a week    Frequency of Social Gatherings with Friends and Family: More than three times a week    Attends Religious Services: More than 4 times per year    Active Member of Clubs or Organizations: Yes    Attends Engineer, structural: More than 4 times per year    Marital Status: Divorced    Tobacco Counseling Counseling given: Not Answered   Clinical Intake:  Pre-visit preparation completed: No  Pain : No/denies pain     BMI - recorded: 31.62 Nutritional Status: BMI > 30  Obese Nutritional Risks: None Diabetes: No  How often do you need to have someone help you when you read instructions, pamphlets, or other written materials from your doctor or pharmacy?: 1 - Never  Diabetic?  No  Interpreter Needed?: No  Information entered by :: Theresa Mulligan LPN   Activities of Daily Living    08/25/2022    8:25 AM  In your present state of health, do you have any difficulty performing the following activities:  Hearing? 0  Vision? 0  Difficulty concentrating or making decisions? 0  Walking or climbing stairs? 0  Dressing or bathing? 0  Doing errands, shopping? 0  Preparing Food and eating ? N  Using the Toilet? N  In the past six months, have you accidently leaked urine? N  Do you have problems with loss of bowel control? N  Managing your Medications? N   Managing your Finances? N  Housekeeping or managing your Housekeeping? N    Patient Care Team: Nelwyn Salisbury, MD as PCP - General (Family Medicine) Regan Lemming, MD as PCP - Electrophysiology (Cardiology) Verner Chol, [facility] as Pharmacist (Pharmacist)  Indicate any recent Medical Services you may have received from other than [facility] providers in the past year (date may be approximate).     Assessment:   This is a routine wellness examination for Ada.  Hearing/Vision screen Hearing Screening - Comments:: Denies hearing difficulties   Vision Screening - Comments:: Wears rx glasses - up to date with routine eye exams with  [facility]  Dietary issues and exercise activities discussed: Exercise limited by: None identified   Goals Addressed               This Visit's Progress     Lose weight (pt-stated)        Continue to lose weight.       Depression Screen    08/25/2022    8:24 AM 09/10/2021   12:19 PM 08/18/2021    2:32 PM 08/22/2020    8:31 AM 08/21/2019    8:43 AM 03/25/2017   10:23 AM 09/23/2015    8:24 AM  PHQ 2/9 Scores  PHQ - 2 Score 0 0 0 0 2 1 0  PHQ- 9 Score  1  0 3      Fall Risk    08/25/2022    8:25 AM 08/21/2022    8:42 AM 09/10/2021   12:19 PM 08/18/2021    2:32 PM 08/17/2021    9:23 PM  Fall Risk   Falls in the past year? 0 0 0 0 0  Number falls in past yr: 0  0  0  Injury with Fall? 0  0  0  Risk for fall due to : No Fall Risks   Medication side effect   Follow up Falls prevention discussed  Falls evaluation completed Falls evaluation completed;Education provided;Falls prevention discussed  FALL RISK PREVENTION PERTAINING TO THE HOME:  Any stairs in or around the home? Yes  If so, are there any without handrails? No  Home free of loose throw rugs in walkways, pet beds, electrical cords, etc? Yes  Adequate lighting in your home to reduce risk of falls? Yes   ASSISTIVE DEVICES UTILIZED TO PREVENT FALLS:  Life alert? No   Use of a cane, walker or w/c? No  Grab bars in the bathroom? No  Shower chair or bench in shower? No  Elevated toilet seat or a handicapped toilet? No   TIMED UP AND GO:  Was the test performed? No . Audio Visit   Cognitive Function:        08/25/2022    8:26 AM 08/18/2021    2:33 PM 08/21/2019    8:49 AM  6CIT Screen  What Year? 0 points 0 points 0 points  What month? 0 points 0 points 0 points  What time? 0 points 0 points   Count back from 20 0 points 0 points 0 points  Months in reverse 0 points 0 points 0 points  Repeat phrase 0 points 4 points 0 points  Total Score 0 points 4 points     Immunizations Immunization History  Administered Date(s) Administered   Fluad Quad(high Dose 65+) 09/10/2021   Influenza, High Dose Seasonal PF 07/24/2016, 04/14/2017, 05/04/2019   Influenza,inj,Quad PF,6+ Mos 07/03/2014   Influenza-Unspecified 06/11/2015, 04/14/2017, 04/20/2018, 05/04/2019   PFIZER(Purple Top)SARS-COV-2 Vaccination 10/30/2019, 11/20/2019, 08/03/2020   Tdap 09/23/2015   Zoster Recombinat (Shingrix) 05/04/2019, 08/24/2019    TDAP status: Up to date  Flu Vaccine status: Up to date  Pneumococcal vaccine status: Declined,  Education has been provided regarding the importance of this vaccine but patient still declined. Advised may receive this vaccine at local pharmacy or Health Dept. Aware to provide a copy of the vaccination record if obtained from local pharmacy or Health Dept. Verbalized acceptance and understanding.   Covid-19 vaccine status: Completed vaccines  Qualifies for Shingles Vaccine? Yes   Zostavax completed Yes   Shingrix Completed?: Yes  Screening Tests Health Maintenance  Topic Date Due   COVID-19 Vaccine (4 - 2023-24 season) 09/10/2022 (Originally 05/08/2022)   INFLUENZA VACCINE  12/06/2022 (Originally 04/07/2022)   Pneumonia Vaccine 22+ Years old (1 - PCV) 08/26/2023 (Originally 04/28/2013)   MAMMOGRAM  08/26/2023 (Originally 12/25/2018)    COLONOSCOPY (Pts 45-14yrs Insurance coverage will need to be confirmed)  08/26/2023 (Originally 05/23/2017)   Medicare Annual Wellness (AWV)  08/26/2023   DTaP/Tdap/Td (2 - Td or Tdap) 09/22/2025   DEXA SCAN  Completed   Hepatitis C Screening  Completed   Zoster Vaccines- Shingrix  Completed   HPV VACCINES  Aged Out    Health Maintenance  There are no preventive care reminders to display for this patient.   Colorectal cancer screening: Referral to GI placed Patient declined. Pt aware the office will call re: appt.  Mammogram status: Ordered Patient deferred. Pt provided with contact info and advised to call to schedule appt.   Bone Density status: Completed 12/24/16. Results reflect: Bone density results: OSTEOPOROSIS. Repeat every   years.  Lung Cancer Screening: (Low Dose CT Chest recommended if Age 35-80 years, 30 pack-year currently smoking OR have quit w/in 15years.) does not qualify.     Additional Screening:  Hepatitis C Screening: does qualify; Completed 10/21/17  Vision Screening: Recommended annual ophthalmology exams for early detection of glaucoma and other disorders of the eye. Is the patient up  to date with their annual eye exam?  Yes  Who is the provider or what is the name of the office in which the patient attends annual eye exams? [facility] If pt is not established with a provider, would they like to be referred to a provider to establish care? No .   Dental Screening: Recommended annual dental exams for proper oral hygiene  Community Resource Referral / Chronic Care Management:  CRR required this visit?  No   CCM required this visit?  No      Plan:     I have personally reviewed and noted the following in the patient's chart:   Medical and social history Use of alcohol, tobacco or illicit drugs  Current medications and supplements including opioid prescriptions. Patient is not currently taking opioid prescriptions. Functional ability and  status Nutritional status Physical activity Advanced directives List of other physicians Hospitalizations, surgeries, and ER visits in previous 12 months Vitals Screenings to include cognitive, depression, and falls Referrals and appointments  In addition, I have reviewed and discussed with patient certain preventive protocols, quality metrics, and best practice recommendations. A written personalized care plan for preventive services as well as general preventive health recommendations were provided to patient.     Whitney Rung, LPN   16/06/9603   Nurse Notes: None

## 2022-08-28 ENCOUNTER — Telehealth: Payer: Self-pay | Admitting: Pharmacist

## 2022-08-28 NOTE — Chronic Care Management (AMB) (Signed)
Chronic Care Management Pharmacy Assistant   Name: ANNAIS CRAFTS  MRN: [id number] DOB: 1946/04/15  Reason for Encounter: Medication Review / Medication Coordination Call   Recent office visits:  08/25/22 Theresa Mulligan LPN - Medicare annual wellness exam   Recent consult visits:  06/10/2022 Unk Lightning RN [facility]) - Patient was seen for complete AV block. Orders for cup paceart remote device check. No additional chart notes.   Hospital visits:  None  Medications: Outpatient Encounter Medications as of 08/28/2022  Medication Sig   albuterol (PROAIR HFA) 108 (90 Base) MCG/ACT inhaler TAKE 2 PUFFS BY MOUTH EVERY 4 HOURS AS NEEDED   amLODipine (NORVASC) 5 MG tablet Take 1 tablet (5 mg total) by mouth daily.   ARMOUR THYROID 60 MG tablet Take 1 tablet (60 mg total) by mouth daily.   budesonide-formoterol (SYMBICORT) 80-4.5 MCG/ACT inhaler INHALE TWO PUFFS into THE lungs TWICE DAILY   Calcium Carbonate-Vit D-Min (CALCIUM 1200 PO) Take 1,200 mg by mouth daily.   Cholecalciferol 25 MCG (1000 UT) tablet Take 1,000 Units by mouth daily.   Coenzyme Q10 (CO Q 10 PO) Take 100 mg by mouth daily.    ipratropium-albuterol (DUONEB) 0.5-2.5 (3) MG/3ML SOLN Take 3 mLs by nebulization every 4 (four) hours as needed (asthma).   MAGNESIUM PO Take 450 mg by mouth at bedtime.    metoprolol succinate (TOPROL-XL) 100 MG 24 hr tablet TAKE ONE TABLET BY MOUTH ONCE DAILY WITH A MEAL   montelukast (SINGULAIR) 10 MG tablet Take 1 tablet (10 mg total) by mouth daily.   Multiple Vitamin (MULTIVITAMIN WITH MINERALS) TABS tablet Take 1 tablet by mouth daily.   Omega-3 Fatty Acids (FISH OIL PO) Take 1,300 mg by mouth daily.    omeprazole (PRILOSEC) 40 MG capsule TAKE 1 CAPSULE BY MOUTH EVERY DAY   temazepam (RESTORIL) 30 MG capsule Take 1 capsule (30 mg total) by mouth at bedtime.   No facility-administered encounter medications on file as of 08/28/2022.   Reviewed chart for medication  changes ahead of medication coordination call.  BP Readings from Last 3 Encounters:  09/10/21 122/70  07/23/21 120/70  03/06/20 126/62    Lab Results  Component Value Date   HGBA1C 6.3 09/10/2021     Patient obtains medications through Vials  90 Days    Last adherence delivery included: Metoprolol succinate 100 MG 24 hr- one tablet daily Montelukast 10 mg - one tablet daily Armour Thyroid 60 mg daily Amlodipine 5 mg - one tablet daily Temazepam 30 mg - one capsule at bedtime as needed Omeprazole 40 mg - one capsule daily  Patient declined (meds) last month:  Albuterol HFA as needed Symbicort 80-4.5 mcg - two puffs twice daily   Patient is due for next adherence delivery on: 09/11/2022   Called patient and reviewed medications and coordinated delivery.   This delivery to include: Metoprolol succinate 100 MG 24 hr- one tablet daily Montelukast 10 mg - one tablet daily Armour Thyroid 60 mg daily Amlodipine 5 mg - one tablet daily Temazepam 30 mg - one capsule at bedtime as needed Omeprazole 40 mg - one capsule daily   Patient will need a short fill: No short fill needed   Coordinated acute fill: No acute fill needed   Patient declined the following medications:  Patient has plenty on hand, she rarely needs them in the winter. Albuterol HFA as needed Symbicort 80-4.5 mcg - two puffs twice daily   Confirmed delivery date of 09/11/2022, advised  patient that pharmacy will contact them the morning of delivery.   Care Gaps: AWV - scheduled 08/30/2023 Last BP - 122/70 on 09/10/2021 Last A1C - 6.3 on 09/10/2021  Star Rating Drugs: None  Inetta Fermo [facility]  Clinical Pharmacist Assistant [phone]

## 2022-09-09 ENCOUNTER — Ambulatory Visit (INDEPENDENT_AMBULATORY_CARE_PROVIDER_SITE_OTHER): Payer: PPO

## 2022-09-09 ENCOUNTER — Other Ambulatory Visit: Payer: Self-pay | Admitting: Family Medicine

## 2022-09-09 DIAGNOSIS — I442 Atrioventricular block, complete: Secondary | ICD-10-CM | POA: Diagnosis not present

## 2022-09-10 ENCOUNTER — Other Ambulatory Visit: Payer: Self-pay | Admitting: Family Medicine

## 2022-09-10 ENCOUNTER — Telehealth: Payer: Self-pay

## 2022-09-10 LAB — CUP PACEART REMOTE DEVICE CHECK
Battery Remaining Longevity: 47 mo
Battery Remaining Percentage: 43 %
Battery Voltage: 2.98 V
Brady Statistic AP VP Percent: 11 %
Brady Statistic AP VS Percent: 14 %
Brady Statistic AS VP Percent: 59 %
Brady Statistic AS VS Percent: 16 %
Brady Statistic RA Percent Paced: 24 %
Brady Statistic RV Percent Paced: 70 %
Date Time Interrogation Session: 20240103020023
Implantable Lead Connection Status: 753985
Implantable Lead Connection Status: 753985
Implantable Lead Implant Date: 20171012
Implantable Lead Implant Date: 20171012
Implantable Lead Location: 753859
Implantable Lead Location: 753860
Implantable Pulse Generator Implant Date: 20171012
Lead Channel Impedance Value: 380 Ohm
Lead Channel Impedance Value: 530 Ohm
Lead Channel Pacing Threshold Amplitude: 0.5 V
Lead Channel Pacing Threshold Amplitude: 0.5 V
Lead Channel Pacing Threshold Pulse Width: 0.5 ms
Lead Channel Pacing Threshold Pulse Width: 0.5 ms
Lead Channel Sensing Intrinsic Amplitude: 2.1 mV
Lead Channel Sensing Intrinsic Amplitude: 4 mV
Lead Channel Setting Pacing Amplitude: 2 V
Lead Channel Setting Pacing Amplitude: 2.5 V
Lead Channel Setting Pacing Pulse Width: 0.5 ms
Lead Channel Setting Sensing Sensitivity: 2 mV
Pulse Gen Model: 2272
Pulse Gen Serial Number: 7955201

## 2022-09-10 NOTE — Telephone Encounter (Signed)
Alert received from [employer]:  Scheduled remote reviewed. Normal device function.   There were 58 atrial arrhythmias detected, AF burden is less than 1%, longest episode was 3 hours and 40 minutes, ? [place], sent to triage. Ventricular pacing greater than the limit, ventricular pacing 70% of the time.  Per review of Pt history, she has historically had short episodes of possible atrial fibrillation.  Pt has not been seen in clinic since 2021.  Message sent to scheduler to schedule follow up visit with EP APP to discuss findings on most recent remote check.

## 2022-09-20 DIAGNOSIS — Z950 Presence of cardiac pacemaker: Secondary | ICD-10-CM | POA: Insufficient documentation

## 2022-09-20 NOTE — Progress Notes (Unsigned)
Cardiology Office Note Date:  09/22/2022  Patient ID:  Whitney, Evans 08-19-1946, MRN [id number] PCP:  Laurey Morale, MD  Cardiologist:  None Electrophysiologist: Will Meredith Leeds, MD   Chief Complaint: 1year f/up; past due  History of Present Illness: Whitney Evans is a 77 y.o. female with PMH notable for CHB s/p St.J PM, HTN; seen today for Will Meredith Leeds, MD for routine electrophysiology followup. Since last being seen in our clinic the patient reports doing ok.   She was last seen by Dr. Curt Bears 02/2020, doing well no changes made.  She has recently purchase a chair yoga video to complete at home.  Most recent device interrogation showed parox AF, longest episode 3.5h.  She has poor exercise tolerance, but has for a while and believes this is due to inactivity.  she denies chest pain, palpitations, dyspnea, PND, orthopnea, nausea, vomiting, dizziness, syncope, edema, weight gain, or early satiety.    Device Information: St.J dual chamber PPM, imp 06/2016; dx CHB w syncope  Past Medical History:  Diagnosis Date   Allergic rhinitis    Asthma    Chronic hoarseness    sees Dr. Barton Fanny (ENT) in [place] hypertension    GERD (gastroesophageal reflux disease)    Hypothyroidism    Insomnia    Neck pain, chronic     Past Surgical History:  Procedure Laterality Date   CESAREAN SECTION     x 2   COLONOSCOPY  05/24/07   per Dr. Fuller Plan, benign polpys, and diverticulosis, repeat 5 years    EP IMPLANTABLE DEVICE N/A 06/18/2016   Procedure: Pacemaker Implant;  Surgeon: Will Meredith Leeds, MD;  Location: [facility];  Service: Cardiovascular;  Laterality: N/A;   EYE SURGERY     left foot surgery     VESICOVAGINAL FISTULA CLOSURE W/ TAH     WISDOM TOOTH EXTRACTION      Current Outpatient Medications  Medication Instructions   albuterol (PROAIR HFA) 108 (90 Base) MCG/ACT inhaler TAKE 2 PUFFS BY MOUTH EVERY 4 HOURS AS NEEDED   amLODipine  (NORVASC) 5 mg, Oral, Daily   Armour Thyroid 60 mg, Oral, Daily   budesonide-formoterol (SYMBICORT) 80-4.5 MCG/ACT inhaler INHALE TWO PUFFS into THE lungs TWICE DAILY   Calcium Carbonate-Vit D-Min (CALCIUM 1200 PO) 1,200 mg, Oral, Daily   Cholecalciferol 1,000 Units, Oral, Daily   Coenzyme Q10 (CO Q 10 PO) 100 mg, Oral, Daily   ipratropium-albuterol (DUONEB) 0.5-2.5 (3) MG/3ML SOLN 3 mLs, Nebulization, Every 4 hours PRN   MAGNESIUM PO 450 mg, Oral, Daily at bedtime   metoprolol succinate (TOPROL-XL) 100 MG 24 hr tablet TAKE ONE TABLET BY MOUTH ONCE DAILY WITH A MEAL   montelukast (SINGULAIR) 10 mg, Oral, Daily   Multiple Vitamin (MULTIVITAMIN WITH MINERALS) TABS tablet 1 tablet, Oral, Daily   Omega-3 Fatty Acids (FISH OIL PO) 1,300 mg, Oral, Daily   omeprazole (PRILOSEC) 40 MG capsule TAKE 1 CAPSULE BY MOUTH EVERY DAY   temazepam (RESTORIL) 30 mg, Oral, Daily at bedtime   Social History:  The patient  reports that she has quit smoking. She has never used smokeless tobacco. She reports current alcohol use of about 2.0 standard drinks of alcohol per week. She reports that she does not use drugs.   Family History:  The patient's family history includes Atrial fibrillation in her granddaughter and grandson; Coronary artery disease in an other family member; Diabetes in an other family member; Heart attack (age  of onset: 74) in her father; Hyperlipidemia in an other family member; Hypertension in an other family member; Stroke in her mother and another family member.  ROS:  Please see the history of present illness. All other systems are reviewed and otherwise negative.   PHYSICAL EXAM:  VS:  BP 118/60   Pulse 80   Ht 5\' 4"  (1.626 m)   Wt 194 lb (88 kg)   SpO2 98%   BMI 33.30 kg/m  BMI: Body mass index is 33.3 kg/m.  GEN- The patient is well appearing, alert and oriented x 3 today.   HEENT: normocephalic, atraumatic; sclera clear, conjunctiva pink; hearing intact; oropharynx clear; neck  supple, no JVP Lungs- Clear to ausculation bilaterally, normal work of breathing.  No wheezes, rales, rhonchi Heart- Regular rate and rhythm, no murmurs, rubs or gallops, PMI not laterally displaced GI- soft, non-tender, non-distended, bowel sounds present, no hepatosplenomegaly Extremities- Trace peripheral edema. no clubbing or cyanosis; DP/PT/radial pulses 2+ bilaterally MS- no significant deformity or atrophy Skin- warm and dry, no rash or lesion, device pocket well-healed Psych- euthymic mood, full affect Neuro- strength and sensation are intact   Device interrogation done today and reviewed by myself:  Battery good Lead thresholds, impedence, sensing stable Dependent on device, no R waves Multiple AF/AT episodes 12/25 and 12/26 No changes made today  EKG is ordered. Personal review of EKG from today shows:  A-sensed, V-paced, rate 80bpm, LAD  Recent Labs: No results found for requested labs within last 365 days.  No results found for requested labs within last 365 days.   CrCl cannot be calculated (Patient's most recent lab result is older than the maximum 21 days allowed.).   Wt Readings from Last 3 Encounters:  09/22/22 194 lb (88 kg)  08/25/22 190 lb (86.2 kg)  08/18/21 200 lb (90.7 kg)     Additional studies reviewed include: Previous EP, cardiology notes.     ASSESSMENT AND PLAN:  #) CHB s/p PPM Device functioning well, see paceart for details No changes to settings  #) parox Afib / subclinical AFib CHA2DS2-VASc Score = 3 [CHF History: 0, HTN History: 1, Diabetes History: 0, Stroke History: 0, Vascular Disease History: 0, Age Score: 1, Gender Score: 1].  Therefore, the patient's annual risk of stroke is 3.2 % Discussed with Dr. Curt Bears.  All episodes of Afib were during 12/25 and 12/26, patient states she was very stressed at this time because she was hosting large family at her home. Overall AT/AF burden remains low at <1%. Shared decision making with  patient about whether to start [facility] or continue to monitor AF burden. Patient prefers to continue to monitor at this time, but would be agreeable to [facility] if AF burden increases.  - will have close in-person follow-up in 3 months.      Current medicines are reviewed at length with the patient today.   The patient does not have concerns regarding her medicines.  The following changes were made today:  none  Labs/ tests ordered today include:  No orders of the defined types were placed in this encounter.    Disposition: Follow up with EP APP in in 3 months   Signed, Mamie Levers, NP  09/22/22 4:42 PM   [address] [place] [address] [phone] (office)  [phone] (fax)

## 2022-09-22 ENCOUNTER — Encounter: Payer: Self-pay | Admitting: Cardiology

## 2022-09-22 ENCOUNTER — Ambulatory Visit: Payer: PPO | Attending: Physician Assistant | Admitting: Cardiology

## 2022-09-22 VITALS — BP 118/60 | HR 80 | Ht 64.0 in | Wt 194.0 lb

## 2022-09-22 DIAGNOSIS — I480 Paroxysmal atrial fibrillation: Secondary | ICD-10-CM | POA: Diagnosis not present

## 2022-09-22 DIAGNOSIS — Z950 Presence of cardiac pacemaker: Secondary | ICD-10-CM

## 2022-09-22 DIAGNOSIS — I442 Atrioventricular block, complete: Secondary | ICD-10-CM | POA: Diagnosis not present

## 2022-09-22 LAB — CUP PACEART INCLINIC DEVICE CHECK
Battery Remaining Longevity: 49 mo
Battery Voltage: 2.98 V
Brady Statistic RA Percent Paced: 24 %
Brady Statistic RV Percent Paced: 71 %
Date Time Interrogation Session: 20240116164642
Implantable Lead Connection Status: 753985
Implantable Lead Connection Status: 753985
Implantable Lead Implant Date: 20171012
Implantable Lead Implant Date: 20171012
Implantable Lead Location: 753859
Implantable Lead Location: 753860
Implantable Pulse Generator Implant Date: 20171012
Lead Channel Sensing Intrinsic Amplitude: 1.9 mV
Lead Channel Sensing Intrinsic Amplitude: 4 mV
Lead Channel Setting Pacing Amplitude: 2 V
Lead Channel Setting Pacing Amplitude: 2.5 V
Lead Channel Setting Pacing Pulse Width: 0.5 ms
Lead Channel Setting Sensing Sensitivity: 2 mV
Pulse Gen Model: 2272
Pulse Gen Serial Number: 7955201

## 2022-09-22 NOTE — Patient Instructions (Signed)
Medication Instructions:    Your physician recommends that you continue on your current medications as directed. Please refer to the Current Medication list given to you today.  *If you need a refill on your cardiac medications before your next appointment, please call your pharmacy*   Lab Work: [place]    If you have labs (blood work) drawn today and your tests are completely normal, you will receive your results only by: [place] (if you have MyChart) OR A paper copy in the mail If you have any lab test that is abnormal or we need to change your treatment, we will call you to review the results.   Testing/Procedures: NONE ORDERED  TODAY    Follow-Up: At [facility], you and your health needs are our priority.  As part of our continuing mission to provide you with exceptional heart care, we have created designated Provider Care Teams.  These Care Teams include your primary Cardiologist (physician) and Advanced Practice Providers (APPs -  Physician Assistants and Nurse Practitioners) who all work together to provide you with the care you need, when you need it.  We recommend signing up for the patient portal called "MyChart".  Sign up information is provided on this After Visit Summary.  MyChart is used to connect with patients for Virtual Visits (Telemedicine).  Patients are able to view lab/test results, encounter notes, upcoming appointments, etc.  Non-urgent messages can be sent to your provider as well.   To learn more about what you can do with MyChart, go to [email or web address].    Your next appointment:   3 month(s)  Provider:   You will follow up in the [facility] located at [facility]. Your provider will be: Roderic Palau, NP or Clint R. Fenton, PA-C    Other Instructions

## 2022-10-05 NOTE — Progress Notes (Signed)
Remote pacemaker transmission.   

## 2022-10-30 ENCOUNTER — Other Ambulatory Visit: Payer: Self-pay | Admitting: Family Medicine

## 2022-11-05 ENCOUNTER — Other Ambulatory Visit: Payer: Self-pay | Admitting: Family Medicine

## 2022-11-09 ENCOUNTER — Other Ambulatory Visit: Payer: Self-pay

## 2022-11-09 MED ORDER — METOPROLOL SUCCINATE ER 100 MG PO TB24: ORAL_TABLET | ORAL | 1 refills | Status: DC

## 2022-11-09 MED ORDER — AMLODIPINE BESYLATE 5 MG PO TABS: 5.0000 mg | ORAL_TABLET | Freq: Every day | ORAL | 1 refills | Status: DC

## 2022-11-09 MED ORDER — ARMOUR THYROID 60 MG PO TABS: 60.0000 mg | ORAL_TABLET | Freq: Every day | ORAL | 1 refills | Status: DC

## 2022-11-09 MED ORDER — MONTELUKAST SODIUM 10 MG PO TABS: 10.0000 mg | ORAL_TABLET | Freq: Every day | ORAL | 1 refills | Status: DC

## 2022-11-29 ENCOUNTER — Other Ambulatory Visit: Payer: Self-pay | Admitting: Family Medicine

## 2022-12-09 ENCOUNTER — Ambulatory Visit (INDEPENDENT_AMBULATORY_CARE_PROVIDER_SITE_OTHER): Payer: PPO

## 2022-12-09 DIAGNOSIS — I442 Atrioventricular block, complete: Secondary | ICD-10-CM

## 2022-12-09 LAB — CUP PACEART REMOTE DEVICE CHECK
Battery Remaining Longevity: 43 mo
Battery Remaining Percentage: 41 %
Battery Voltage: 2.96 V
Brady Statistic AP VP Percent: 15 %
Brady Statistic AP VS Percent: 1 %
Brady Statistic AS VP Percent: 85 %
Brady Statistic AS VS Percent: 1 %
Brady Statistic RA Percent Paced: 15 %
Brady Statistic RV Percent Paced: 99 %
Date Time Interrogation Session: 20240403043749
Implantable Lead Connection Status: 753985
Implantable Lead Connection Status: 753985
Implantable Lead Implant Date: 20171012
Implantable Lead Implant Date: 20171012
Implantable Lead Location: 753859
Implantable Lead Location: 753860
Implantable Pulse Generator Implant Date: 20171012
Lead Channel Impedance Value: 440 Ohm
Lead Channel Impedance Value: 600 Ohm
Lead Channel Pacing Threshold Amplitude: 0.5 V
Lead Channel Pacing Threshold Amplitude: 0.5 V
Lead Channel Pacing Threshold Pulse Width: 0.5 ms
Lead Channel Pacing Threshold Pulse Width: 0.5 ms
Lead Channel Sensing Intrinsic Amplitude: 2.2 mV
Lead Channel Sensing Intrinsic Amplitude: 4 mV
Lead Channel Setting Pacing Amplitude: 2 V
Lead Channel Setting Pacing Amplitude: 2.5 V
Lead Channel Setting Pacing Pulse Width: 0.5 ms
Lead Channel Setting Sensing Sensitivity: 2 mV
Pulse Gen Model: 2272
Pulse Gen Serial Number: 7955201

## 2022-12-23 ENCOUNTER — Encounter (HOSPITAL_COMMUNITY): Payer: Self-pay | Admitting: Physician Assistant

## 2022-12-23 ENCOUNTER — Ambulatory Visit (HOSPITAL_COMMUNITY)
Admission: RE | Admit: 2022-12-23 | Discharge: 2022-12-23 | Disposition: A | Discharge status: Home / Self Care | Payer: PPO | Source: Ambulatory Visit | Attending: Physician Assistant | Admitting: Physician Assistant

## 2022-12-23 VITALS — BP 142/76 | HR 73 | Ht 64.0 in | Wt 194.4 lb

## 2022-12-23 DIAGNOSIS — Z950 Presence of cardiac pacemaker: Secondary | ICD-10-CM | POA: Insufficient documentation

## 2022-12-23 DIAGNOSIS — R9431 Abnormal electrocardiogram [ECG] [EKG]: Secondary | ICD-10-CM | POA: Diagnosis not present

## 2022-12-23 DIAGNOSIS — I10 Essential (primary) hypertension: Secondary | ICD-10-CM | POA: Insufficient documentation

## 2022-12-23 DIAGNOSIS — Z87891 Personal history of nicotine dependence: Secondary | ICD-10-CM | POA: Diagnosis not present

## 2022-12-23 DIAGNOSIS — Z79899 Other long term (current) drug therapy: Secondary | ICD-10-CM | POA: Insufficient documentation

## 2022-12-23 DIAGNOSIS — D6869 Other thrombophilia: Secondary | ICD-10-CM | POA: Diagnosis not present

## 2022-12-23 DIAGNOSIS — I480 Paroxysmal atrial fibrillation: Secondary | ICD-10-CM | POA: Diagnosis not present

## 2022-12-23 NOTE — Progress Notes (Signed)
Primary Care Physician: Nelwyn Salisbury, MD Primary Cardiologist: none Primary Electrophysiologist: Dr Elberta Fortis Referring Physician: Sherie Don NP   Whitney Evans is a 77 y.o. female with a history of CHB s/p PPM, HTN, atrial fibrillation who presents for follow up in the [facility].  The patient was initially diagnosed with atrial fibrillation on device interrogation at her visit on 09/22/22. The longest episode was 3.5 hours. In shared decision making, anticoagulation was not started at that time given the brevity of the episodes. Patient has a CHADS2VASC score of 3.  On follow up today, patient reports that she has felt well since her last visit. Her device interrogation on 12/09/22 showed no interim afib episodes. She denies significant alcohol use but does admit to snoring and daytime somnolence.   Today, she denies symptoms of palpitations, chest pain, shortness of breath, orthopnea, PND, lower extremity edema, dizziness, presyncope, syncope, bleeding, or neurologic sequela. The patient is tolerating medications without difficulties and is otherwise without complaint today.    Atrial Fibrillation Risk Factors:  she does have symptoms or diagnosis of sleep apnea. she does not have a history of rheumatic fever. she does not have a history of alcohol use. The patient does not have a history of early familial atrial fibrillation or other arrhythmias.  she has a BMI of Body mass index is 33.37 kg/m.Marland Kitchen Filed Weights   12/23/22 0841  Weight: 88.2 kg    Family History  Problem Relation Age of Onset   Stroke Mother    Heart attack Father 48   Coronary artery disease Other    Diabetes Other    Hyperlipidemia Other    Hypertension Other    Stroke Other    Atrial fibrillation Granddaughter    Atrial fibrillation Grandson      Atrial Fibrillation Management history:  Previous antiarrhythmic drugs: none Previous cardioversions: none Previous  ablations: none Anticoagulation history: none   Past Medical History:  Diagnosis Date   Allergic rhinitis    Asthma    Chronic hoarseness    sees Dr. [name] (ENT) in [facility]    Essential hypertension    GERD (gastroesophageal reflux disease)    Hypothyroidism    Insomnia    Neck pain, chronic    Past Surgical History:  Procedure Laterality Date   CESAREAN SECTION     x 2   COLONOSCOPY  05/24/07   per Dr. Russella Dar, benign polpys, and diverticulosis, repeat 5 years    EP IMPLANTABLE DEVICE N/A 06/18/2016   Procedure: Pacemaker Implant;  Surgeon: Will [name], MD;  Location: [facility];  Service: Cardiovascular;  Laterality: N/A;   EYE SURGERY     left foot surgery     VESICOVAGINAL FISTULA CLOSURE W/ TAH     WISDOM TOOTH EXTRACTION      Current Outpatient Medications  Medication Sig Dispense Refill   albuterol (PROAIR HFA) 108 (90 Base) MCG/ACT inhaler TAKE 2 PUFFS BY MOUTH EVERY 4 HOURS AS NEEDED 18 g 5   amLODipine (NORVASC) 5 MG tablet Take 1 tablet (5 mg total) by mouth daily. 90 tablet 1   ARMOUR THYROID 60 MG tablet Take 1 tablet (60 mg total) by mouth daily. 90 tablet 1   budesonide-formoterol (SYMBICORT) 80-4.5 MCG/ACT inhaler INHALE TWO PUFFS into THE lungs TWICE DAILY 30.6 g 3   Calcium Carbonate-Vit D-Min (CALCIUM 1200 PO) Take 1,200 mg by mouth daily.     Cholecalciferol 25 MCG (1000 UT) tablet  Take 1,000 Units by mouth daily.     Coenzyme Q10 (CO Q 10 PO) Take 100 mg by mouth daily.      ipratropium-albuterol (DUONEB) 0.5-2.5 (3) MG/3ML SOLN Take 3 mLs by nebulization every 4 (four) hours as needed (asthma). 360 mL 3   MAGNESIUM PO Take 450 mg by mouth at bedtime.      metoprolol succinate (TOPROL-XL) 100 MG 24 hr tablet Take with or immediately following a meal. 90 tablet 1   montelukast (SINGULAIR) 10 MG tablet Take 1 tablet (10 mg total) by mouth daily. 90 tablet 1   Multiple Vitamin (MULTIVITAMIN WITH MINERALS) TABS tablet Take 1 tablet by mouth  daily.     Omega-3 Fatty Acids (FISH OIL PO) Take 1,300 mg by mouth daily.      omeprazole (PRILOSEC) 40 MG capsule TAKE ONE CAPSULE BY MOUTH ONCE DAILY 90 capsule 0   temazepam (RESTORIL) 30 MG capsule Take 1 capsule (30 mg total) by mouth at bedtime. 90 capsule 1   No current facility-administered medications for this encounter.    Allergies  Allergen Reactions   Morphine Sulfate Hives and Shortness Of Breath   Pneumococcal Vaccine Polyvalent Shortness Of Breath and Swelling    Skin peeled off   Shellfish-Derived Products Shortness Of Breath    Other Reaction(s): GI Intolerance    Social History   Socioeconomic History   Marital status: Divorced    Spouse name: Not on file   Number of children: 2   Years of education: 4 years college   Highest education level: Bachelor's degree (e.g., BA, AB, BS)  Occupational History   Occupation: retired respiratory therapist  Tobacco Use   Smoking status: Former   Smokeless tobacco: Never   Tobacco comments:    Former smoker 12/23/22  Vaping Use   Vaping Use: Never used  Substance and Sexual Activity   Alcohol use: Yes    Alcohol/week: 2.0 standard drinks of alcohol    Types: 2 Standard drinks or equivalent per week    Comment: glass of red wine   Drug use: No   Sexual activity: Not on file  Other Topics Concern   Not on file  Social History Narrative   Retired respiratory therapist; divorced   1 daughter [place], [place] ICU nurse   1 son in [place] - Acupuncturist   Social Determinants of Health   Financial Resource Strain: Low Risk  (08/25/2022)   Overall Financial Resource Strain (CARDIA)    Difficulty of Paying Living Expenses: Not hard at all  Food Insecurity: No Food Insecurity (08/25/2022)   Hunger Vital Sign    Worried About Running Out of Food in the Last Year: Never true    Ran Out of Food in the Last Year: Never true  Transportation Needs: No Transportation Needs (08/25/2022)   PRAPARE - Therapist, art (Medical): No    Lack of Transportation (Non-Medical): No  Physical Activity: Inactive (08/25/2022)   Exercise Vital Sign    Days of Exercise per Week: 0 days    Minutes of Exercise per Session: 0 min  Stress: No Stress Concern Present (08/25/2022)   [employer] of Occupational Health - Occupational Stress Questionnaire    Feeling of Stress : Not at all  Social Connections: Moderately Integrated (08/25/2022)   Social Connection and Isolation Panel [NHANES]    Frequency of Communication with Friends and Family: More than three times a week    Frequency of Social Gatherings  with Friends and Family: More than three times a week    Attends Religious Services: More than 4 times per year    Active Member of Clubs or Organizations: Yes    Attends Banker Meetings: More than 4 times per year    Marital Status: Divorced  Intimate Partner Violence: Not At Risk (08/25/2022)   Humiliation, Afraid, Rape, and Kick questionnaire    Fear of Current or Ex-Partner: No    Emotionally Abused: No    Physically Abused: No    Sexually Abused: No     ROS- All systems are reviewed and negative except as per the HPI above.  Physical Exam: Vitals:   12/23/22 0841  BP: (!) 142/76  Pulse: 73  Weight: 88.2 kg  Height: 5\' 4"  (1.626 m)    GEN- The patient is a well appearing female, alert and oriented x 3 today.   Head- normocephalic, atraumatic Eyes-  Sclera clear, conjunctiva pink Ears- hearing intact Oropharynx- clear Neck- supple  Lungs- Clear to ausculation bilaterally, normal work of breathing Heart- Regular rate and rhythm, no murmurs, rubs or gallops  GI- soft, NT, ND, + BS Extremities- no clubbing, cyanosis, or edema MS- no significant deformity or atrophy Skin- no rash or lesion Psych- euthymic mood, full affect Neuro- strength and sensation are intact  Wt Readings from Last 3 Encounters:  12/23/22 88.2 kg  09/22/22 88 kg  08/25/22 86.2  kg    EKG today demonstrates  A sense V paced rhythm Vent. rate 73 BPM PR interval 236 ms QRS duration 170 ms QT/QTcB 466/513 ms  Echo 06/18/16 demonstrated Left ventricle: The cavity size was normal. Wall thickness was    normal. Systolic function was normal. The estimated ejection    fraction was in the range of 60% to 65%. Wall motion was normal;    there were no regional wall motion abnormalities. The study is    not technically sufficient to allow evaluation of LV diastolic    function.  - Aortic valve: There was trivial regurgitation.  - Mitral valve: There was moderate regurgitation.  - Left atrium: The atrium was mildly dilated.   Impressions:  - Normal LV systolic function; trace AI; moderate (3+) MR; mild    LAE; mild TR.    Epic records are reviewed at length today.  CHA2DS2-VASc Score = 3  The patient's score is based upon: CHF History: 0 HTN History: 1 Diabetes History: 0 Stroke History: 0 Vascular Disease History: 0 Age Score: 1 Gender Score: 1       ASSESSMENT AND PLAN: 1. Paroxysmal Atrial Fibrillation (ICD10:  I48.0) The patient's CHA2DS2-VASc score is 3, indicating a 3.2% annual risk of stroke.   PPM remote interrogation shows no interim episodes of afib. We discussed risks and benefits of anticoagulation. She is clear in her decision to remain off anticoagulation at this time but is willing to consider if she has more persistent afib.  Continue Toprol 100 mg daily Patient would like to start an exercise program, will refer to [facility] PREP. She has deferred sleep study for now.   2. Secondary Hypercoagulable State (ICD10:  D68.69) CHA2DS2-VASc Score of 3.  However, the patient is not on anticoagulation due to her very low burden of afib and desire to remain off anticoagulation.    3. CHB S/p PPM, followed by Dr Elberta Fortis and the device clinic.  4. HTN Stable, no changes today.   Follow up with Dr Elberta Fortis or EP APP in 9  months.    [name]  PA-C Afib Clinic [facility] [address] [place], [place] [address] [phone] 12/23/2022 9:00 AM

## 2022-12-24 ENCOUNTER — Telehealth: Payer: Self-pay | Admitting: *Deleted

## 2022-12-24 NOTE — Telephone Encounter (Signed)
Contacted regarding PREP Class referral. Left voice message to return call for more information. 

## 2022-12-25 ENCOUNTER — Telehealth: Payer: Self-pay | Admitting: *Deleted

## 2022-12-25 NOTE — Telephone Encounter (Signed)
Returned my call regarding PREP Class referral. She is interested in participating at either the [facility] or the [employer]. We will contact back with future class availability.

## 2023-01-01 ENCOUNTER — Telehealth: Payer: Self-pay

## 2023-01-01 NOTE — Telephone Encounter (Signed)
Called to discuss PREP program at [facility], she would like to attend April 29 class,every M/W 3:30-4:45; will come into [facility] at 2:45 on 4/29 to complete assessment visit and stay to attend first class at 3:30

## 2023-01-04 NOTE — Progress Notes (Signed)
[facility] PREP Evaluation  Patient Details  Name: Whitney Evans MRN: [id number] Date of Birth: 02-[name]-1946 Age: 77 y.o. PCP: Whitney Salisbury, MD  Vitals:   01/04/23 1513  BP: (!) 112/52  Pulse: 76  SpO2: 99%  Weight: 194 lb 9.6 oz (88.3 kg)     [facility] Eval - 01/04/23 1500       [facility] "PREP" Location   [facility] "PREP" Location [facility]      Referral    Referring Provider Whitney Evans    Reason for referral Hypertension;Inactivity;Obesitity/Overweight    Program Start Date 01/04/23      Measurement   Waist Circumference 44.5 inches    Hip Circumference 49.5 inches    Body fat 45.1 percent      Information for Trainer   Goals --   Increase stamina, goal of reaching 5000 steps/day   Current Exercise --   walk in yard   Orthopedic Concerns --   foot surgery   Pertinent Medical History --   HTN, Afib X1; pacer, asthma   Medications that affect exercise Asthma inhaler;Beta blocker      Timed Up and Go (TUGS)   Timed Up and Go Low risk <9 seconds      Mobility and Daily Activities   I find it easy to walk up or down two or more flights of stairs. 1    I have no trouble taking out the trash. 4    I do housework such as vacuuming and dusting on my own without difficulty. 4    I can easily lift a gallon of milk (8lbs). 3    I can easily walk a mile. 2    I have no trouble reaching into high cupboards or reaching down to pick up something from the floor. 2    I do not have trouble doing out-door work such as Loss adjuster, chartered, raking leaves, or gardening. 1      Mobility and Daily Activities   I feel younger than my age. 2    I feel independent. 4    I feel energetic. 1    I live an active life.  2    I feel strong. 1    I feel healthy. 2    I feel active as other people my age. 2      How fit and strong are you.   Fit and Strong Total Score 31            Past Medical History:  Diagnosis Date   Allergic rhinitis    Asthma    Chronic hoarseness    sees Dr. Benay Evans  (ENT) in [facility]    Essential hypertension    GERD (gastroesophageal reflux disease)    Hypothyroidism    Insomnia    Neck pain, chronic    Past Surgical History:  Procedure Laterality Date   CESAREAN SECTION     x 2   COLONOSCOPY  05/24/07   per Dr. Russella Evans, benign polpys, and diverticulosis, repeat 5 years    EP IMPLANTABLE DEVICE N/A 06/18/2016   Procedure: Pacemaker Implant;  Surgeon: Whitney [name], MD;  [facility];  Service: Cardiovascular;  Laterality: N/A;   EYE SURGERY     left foot surgery     VESICOVAGINAL FISTULA CLOSURE W/ TAH     WISDOM TOOTH EXTRACTION     Social History   Tobacco Use  Smoking Status Former  Smokeless Tobacco Never  Tobacco Comments  Former smoker 12/23/22  To begin PREP class at [employer] today, every M/W 3:30-4:45  Whitney Evans 01/04/2023, 3:16 PM

## 2023-01-04 NOTE — Progress Notes (Signed)
[facility] PREP Weekly Session  Patient Details  Name: Whitney Evans MRN: [id number] Date of Birth: [name] 17, 1946 Age: 77 y.o. PCP: Nelwyn Salisbury, MD  There were no vitals filed for this visit.   [facility] Weekly seesion - 01/04/23 1600       [facility] "PREP" Location   [facility] "PREP" Location [name]      Weekly Session   Topic Discussed Goal setting and welcome to the program   Introductions, review of notebook, tour of facility   Classes attended to date 1             [name] 01/04/2023, 4:49 PM

## 2023-01-18 NOTE — Progress Notes (Signed)
Remote pacemaker transmission.   

## 2023-01-18 NOTE — Progress Notes (Signed)
[facility] PREP Weekly Session  Patient Details  Name: Whitney Evans MRN: [id number] Date of Birth: 1945/12/13 Age: 77 y.o. PCP: Nelwyn Salisbury, MD  Vitals:   01/18/23 1648  Weight: 194 lb (88 kg)     [facility] Weekly seesion - 01/18/23 1600       [facility] "PREP" Location   [facility] "PREP[facility] Location [name]      Weekly Session   Topic Discussed Healthy eating tips   introduced to [employer] app; foods to reduce, foods to increase; eat the rainbow of colors   Minutes exercised this week 160 minutes    Classes attended to date 5             Abdiel Blackerby B Anoop Hemmer 01/18/2023, 4:49 PM

## 2023-01-25 NOTE — Progress Notes (Signed)
[facility] PREP Weekly Session  Patient Details  Name: Whitney Evans MRN: [id number] Date of Birth: [birth year] Age: 77 y.o. PCP: Nelwyn Salisbury, MD  Vitals:   01/25/23 1645  Weight: 194 lb 3.2 oz (88.1 kg)     [facility] Weekly seesion - 01/25/23 1600       [facility] "PREP" Location   [facility] "PREP[facility] Location [name]      Weekly Session   Topic Discussed Health habits   Sugar demo; water: 1/2 body wt in oz   Minutes exercised this week 215 minutes    Classes attended to date 7             [name] 01/25/2023, 4:45 PM

## 2023-01-28 ENCOUNTER — Telehealth: Payer: Self-pay

## 2023-01-28 NOTE — Progress Notes (Signed)
[facility] Coordination [facility] Pharmacy Team  Reason for Encounter: Medication coordination and delivery  Contacted patient to discuss medications and coordinate delivery from [facility]. Spoke with patient on 01/28/2023   Cycle dispensing form sent to [facility] for review.   Last adherence delivery date: 11/10/2022      Patient is due for next adherence delivery on: 02/09/2023  This delivery to include: Vials  90 Days  Metoprolol succinate 100 MG 24 hr- one tablet daily Montelukast 10 mg - one tablet daily Omeprazole 40 mg - one capsule daily  Armour Thyroid 60 mg daily  Amlodipine 5 mg - one tablet daily  Temazepam 30 mg  - at bedtime  Patient declined the following medications this month: None  Confirmed delivery date of 01/09/2023, advised patient that pharmacy will contact them the morning of delivery.  Medications: Outpatient Encounter Medications as of 01/28/2023  Medication Sig   albuterol (PROAIR HFA) 108 (90 Base) MCG/ACT inhaler TAKE 2 PUFFS BY MOUTH EVERY 4 HOURS AS NEEDED   amLODipine (NORVASC) 5 MG tablet Take 1 tablet (5 mg total) by mouth daily.   ARMOUR THYROID 60 MG tablet Take 1 tablet (60 mg total) by mouth daily.   budesonide-formoterol (SYMBICORT) 80-4.5 MCG/ACT inhaler INHALE TWO PUFFS into THE lungs TWICE DAILY   Calcium Carbonate-Vit D-Min (CALCIUM 1200 PO) Take 1,200 mg by mouth daily.   Cholecalciferol 25 MCG (1000 UT) tablet Take 1,000 Units by mouth daily.   Coenzyme Q10 (CO Q 10 PO) Take 100 mg by mouth daily.    ipratropium-albuterol (DUONEB) 0.5-2.5 (3) MG/3ML SOLN Take 3 mLs by nebulization every 4 (four) hours as needed (asthma).   MAGNESIUM PO Take 450 mg by mouth at bedtime.    metoprolol succinate (TOPROL-XL) 100 MG 24 hr tablet Take with or immediately following a meal.   montelukast (SINGULAIR) 10 MG tablet Take 1 tablet (10 mg total) by mouth daily.   Multiple Vitamin (MULTIVITAMIN WITH MINERALS) TABS tablet Take 1  tablet by mouth daily.   Omega-3 Fatty Acids (FISH OIL PO) Take 1,300 mg by mouth daily.    omeprazole (PRILOSEC) 40 MG capsule TAKE ONE CAPSULE BY MOUTH ONCE DAILY   temazepam (RESTORIL) 30 MG capsule Take 1 capsule (30 mg total) by mouth at bedtime.   No facility-administered encounter medications on file as of 01/28/2023.   BP Readings from Last 3 Encounters:  01/04/23 (!) 112/52  12/23/22 (!) 142/76  09/22/22 118/60    Pulse Readings from Last 3 Encounters:  01/04/23 76  12/23/22 73  09/22/22 80    Lab Results  Component Value Date/Time   HGBA1C 6.3 09/10/2021 09:30 AM   HGBA1C 5.8 11/28/2009 12:00 AM   Lab Results  Component Value Date   CREATININE 0.88 09/10/2021   BUN 16 09/10/2021   GFR 65.22 09/10/2021   GFRNONAA 65 06/16/2017   GFRAA 74 06/16/2017   NA 137 09/10/2021   K 4.1 09/10/2021   CALCIUM 9.6 09/10/2021   CO2 24 09/10/2021     Inetta Fermo CMA  Clinical Pharmacist Assistant [phone]

## 2023-01-29 ENCOUNTER — Other Ambulatory Visit: Payer: Self-pay

## 2023-01-29 MED ORDER — OMEPRAZOLE 40 MG PO CPDR: DELAYED_RELEASE_CAPSULE | ORAL | 0 refills | Status: DC

## 2023-02-08 NOTE — Progress Notes (Signed)
[facility] PREP Weekly Session  Patient Details  Name: Whitney Evans MRN: [id number] Date of Birth: 04/08/1946 Age: 77 y.o. PCP: Nelwyn Salisbury, MD  Vitals:   02/08/23 1644  Weight: 192 lb (87.1 kg)     [facility] Weekly seesion - 02/08/23 1600       [facility] "PREP" Location   [facility] "PREP[facility] Location [name]      Weekly Session   Topic Discussed Restaurant Eating   salt demo; limit salt intake to 1500-2300 mg./day   Minutes exercised this week 240 minutes    Classes attended to date 10             [name] 02/08/2023, 4:45 PM

## 2023-02-15 NOTE — Progress Notes (Signed)
[facility] PREP Weekly Session  Patient Details  Name: Whitney Evans MRN: [id number] Date of Birth: 1946/06/20 Age: 77 y.o. PCP: Nelwyn Salisbury, MD  Vitals:   02/15/23 1635  Weight: 191 lb 8 oz (86.9 kg)     [facility] Weekly seesion - 02/15/23 1600       [facility] "PREP" Location   [facility] "PREP" Location [name]      Weekly Session   Topic Discussed Stress management and problem solving   importance of sleep: 7-9 hrs; finger tip mudra breath work for stress reduction   Minutes exercised this week 190 minutes    Classes attended to date 57             Whitney Evans B Whitney Evans 02/15/2023, 4:36 PM

## 2023-02-22 NOTE — Progress Notes (Signed)
[facility] PREP Weekly Session  Patient Details  Name: BRAXTYN KNOTEK MRN: [id number] Date of Birth: 05-01-1946 Age: 77 y.o. PCP: Nelwyn Salisbury, MD  Vitals:   02/22/23 1630  Weight: 190 lb 1.6 oz (86.2 kg)     [facility] Weekly seesion - 02/22/23 1600       [facility] "PREP" Location   [facility] "PREP" Location [name]      Weekly Session   Topic Discussed Expectations and non-scale victories   Halfway through program, time to review/revisit/restate/reflect on goals; staying postive   Minutes exercised this week 60 minutes    Classes attended to date 41             Lilyanne Mcquown B Seung Nidiffer 02/22/2023, 4:31 PM

## 2023-03-01 NOTE — Progress Notes (Signed)
[facility] PREP Weekly Session  Patient Details  Name: SHAKIAH WESTER MRN: [id number] Date of Birth: Aug 05, 1946 Age: 77 y.o. PCP: Nelwyn Salisbury, MD  Vitals:   03/01/23 1635  Weight: 190 lb (86.2 kg)     [facility] Weekly seesion - 03/01/23 1600       [facility] "PREP" Location   [facility] "PREP" Location [name]      Weekly Session   Topic Discussed Other   portion control; visualize your portion size demo; reveiw of Reduced sugar craisins nutritional/food label.   Minutes exercised this week 210 minutes    Classes attended to date 58             [name] 03/01/2023, 4:36 PM

## 2023-03-08 NOTE — Progress Notes (Signed)
[facility] PREP Weekly Session  Patient Details  Name: Whitney Evans MRN: [id number] Date of Birth: September 01, 1946 Age: 77 y.o. PCP: Nelwyn Salisbury, MD  There were no vitals filed for this visit.   [facility] Weekly seesion - 03/08/23 1600       [facility] "PREP" Location   [facility] "PREP" Location [name]      Weekly Session   Topic Discussed Calorie breakdown   Carbs, fats and proteins; simple vs. complex carbs   Minutes exercised this week 210 minutes    Classes attended to date 19             [name] B [name] 03/08/2023, 4:36 PM

## 2023-03-10 ENCOUNTER — Ambulatory Visit (INDEPENDENT_AMBULATORY_CARE_PROVIDER_SITE_OTHER): Payer: PPO

## 2023-03-10 DIAGNOSIS — I442 Atrioventricular block, complete: Secondary | ICD-10-CM | POA: Diagnosis not present

## 2023-03-10 LAB — CUP PACEART REMOTE DEVICE CHECK
Battery Remaining Longevity: 40 mo
Battery Remaining Percentage: 38 %
Battery Voltage: 2.96 V
Brady Statistic AP VP Percent: 21 %
Brady Statistic AP VS Percent: 1 %
Brady Statistic AS VP Percent: 79 %
Brady Statistic AS VS Percent: 1 %
Brady Statistic RA Percent Paced: 21 %
Brady Statistic RV Percent Paced: 99 %
Date Time Interrogation Session: 20240703020020
Implantable Lead Connection Status: 753985
Implantable Lead Connection Status: 753985
Implantable Lead Implant Date: 20171012
Implantable Lead Implant Date: 20171012
Implantable Lead Location: 753859
Implantable Lead Location: 753860
Implantable Pulse Generator Implant Date: 20171012
Lead Channel Impedance Value: 400 Ohm
Lead Channel Impedance Value: 560 Ohm
Lead Channel Pacing Threshold Amplitude: 0.5 V
Lead Channel Pacing Threshold Amplitude: 0.5 V
Lead Channel Pacing Threshold Pulse Width: 0.5 ms
Lead Channel Pacing Threshold Pulse Width: 0.5 ms
Lead Channel Sensing Intrinsic Amplitude: 1.9 mV
Lead Channel Sensing Intrinsic Amplitude: 4 mV
Lead Channel Setting Pacing Amplitude: 2 V
Lead Channel Setting Pacing Amplitude: 2.5 V
Lead Channel Setting Pacing Pulse Width: 0.5 ms
Lead Channel Setting Sensing Sensitivity: 2 mV
Pulse Gen Model: 2272
Pulse Gen Serial Number: 7955201

## 2023-03-15 NOTE — Progress Notes (Signed)
[facility] PREP Weekly Session  Patient Details  Name: Whitney Evans MRN: [id number] Date of Birth: [birth year] Age: 77 y.o. PCP: Nelwyn Salisbury, MD  Vitals:   03/15/23 1625  Weight: 189 lb 8 oz (86 kg)     [facility] Weekly seesion - 03/15/23 1600       [facility] "PREP" Location   [facility] "PREP" Location [name]      Weekly Session   Topic Discussed Finding support;Hitting roadblocks   Membership talk w/Nick   Minutes exercised this week 200 minutes    Classes attended to date 61             Katryn Plummer B Maryum Batterson 03/15/2023, 4:26 PM

## 2023-03-22 NOTE — Progress Notes (Signed)
[facility] PREP Weekly Session  Patient Details  Name: Whitney Evans MRN: [id number] Date of Birth: 1946/08/15 Age: 77 y.o. PCP: Nelwyn Salisbury, MD  Vitals:   03/22/23 1604  Weight: 189 lb 9.6 oz (86 kg)     [facility] Weekly seesion - 03/22/23 1600       [facility] "PREP" Location   [facility] "PREP" Location [name]      Weekly Session   Topic Discussed Other   Final assessment visit scheduled for Wednesday; how fit and strong survey completed; fit testing completed   Minutes exercised this week 230 minutes    Classes attended to date 17             Whitney Evans 03/22/2023, 4:05 PM

## 2023-03-24 NOTE — Progress Notes (Signed)
[facility] PREP Evaluation  Patient Details  Name: Whitney Evans MRN: [id number] Date of Birth: 06/13/1946 Age: 77 y.o. PCP: Nelwyn Salisbury, MD  Vitals:   03/24/23 1518  BP: 110/60  Pulse: 69  SpO2: 98%  Weight: 189 lb 9.6 oz (86 kg)     [facility] Eval - 03/24/23 1500       [facility] "PREP" Location   [facility] "PREP" Location [name]      Referral    Program Start Date 01/04/23    Program End Date 03/24/23      Measurement   Waist Circumference 44.5 inches    Waist Circumference End Program 43 inches    Hip Circumference 49.5 inches    Hip Circumference End Program 47 inches    Body fat 44.6 percent      Mobility and Daily Activities   I find it easy to walk up or down two or more flights of stairs. 3    I have no trouble taking out the trash. 4    I do housework such as vacuuming and dusting on my own without difficulty. 4    I can easily lift a gallon of milk (8lbs). 3    I can easily walk a mile. 3    I have no trouble reaching into high cupboards or reaching down to pick up something from the floor. 4    I do not have trouble doing out-door work such as Loss adjuster, chartered, raking leaves, or gardening. 3      Mobility and Daily Activities   I feel younger than my age. 3    I feel independent. 4    I feel energetic. 3    I live an active life.  3    I feel strong. 4    I feel healthy. 3    I feel active as other people my age. 3      How fit and strong are you.   Fit and Strong Total Score 47            Past Medical History:  Diagnosis Date   Allergic rhinitis    Asthma    Chronic hoarseness    sees Dr. [name] (ENT) in [facility]    Essential hypertension    GERD (gastroesophageal reflux disease)    Hypothyroidism    Insomnia    Neck pain, chronic    Past Surgical History:  Procedure Laterality Date   CESAREAN SECTION     x 2   COLONOSCOPY  05/24/07   per Dr. Russella Dar, benign polpys, and diverticulosis, repeat 5 years    EP IMPLANTABLE DEVICE N/A  06/18/2016   Procedure: Pacemaker Implant;  Surgeon: Will [name], MD;  [facility]: [facility];  Service: Cardiovascular;  Laterality: N/A;   EYE SURGERY     left foot surgery     VESICOVAGINAL FISTULA CLOSURE W/ TAH     WISDOM TOOTH EXTRACTION     Social History   Tobacco Use  Smoking Status Former  Smokeless Tobacco Never  Tobacco Comments   Former smoker 12/23/22  Wt lost: 5 pounds Inches lost: 5 How fit and strong survey: 01/04/23: 31 03/24/23: 47 Education sessions completed: 11 Workout sessions completed: 11   Jesenia Spera B Gilman Olazabal 03/24/2023, 3:22 PM

## 2023-04-01 NOTE — Progress Notes (Signed)
Remote pacemaker transmission.   

## 2023-04-25 ENCOUNTER — Other Ambulatory Visit: Payer: Self-pay | Admitting: Family Medicine

## 2023-05-11 ENCOUNTER — Encounter: Payer: Self-pay | Admitting: Family Medicine

## 2023-05-11 ENCOUNTER — Ambulatory Visit (INDEPENDENT_AMBULATORY_CARE_PROVIDER_SITE_OTHER): Payer: PPO | Admitting: Family Medicine

## 2023-05-11 VITALS — BP 100/60 | HR 61 | Temp 98.0°F | Wt 182.3 lb

## 2023-05-11 DIAGNOSIS — Z0000 Encounter for general adult medical examination without abnormal findings: Secondary | ICD-10-CM | POA: Diagnosis not present

## 2023-05-11 DIAGNOSIS — Z23 Encounter for immunization: Secondary | ICD-10-CM

## 2023-05-11 DIAGNOSIS — E039 Hypothyroidism, unspecified: Secondary | ICD-10-CM

## 2023-05-11 DIAGNOSIS — D649 Anemia, unspecified: Secondary | ICD-10-CM | POA: Diagnosis not present

## 2023-05-11 LAB — CBC WITH DIFFERENTIAL/PLATELET
Basophils Absolute: 0.2 10*3/uL — ABNORMAL HIGH (ref 0.0–0.1)
Basophils Relative: 1.6 % (ref 0.0–3.0)
Eosinophils Absolute: 0.2 10*3/uL (ref 0.0–0.7)
Eosinophils Relative: 1.5 % (ref 0.0–5.0)
HCT: 33.6 % — ABNORMAL LOW (ref 36.0–46.0)
Hemoglobin: 10 g/dL — ABNORMAL LOW (ref 12.0–15.0)
Lymphocytes Relative: 22.9 % (ref 12.0–46.0)
Lymphs Abs: 2.3 10*3/uL (ref 0.7–4.0)
MCHC: 29.7 g/dL — ABNORMAL LOW (ref 30.0–36.0)
MCV: 66.3 fl — ABNORMAL LOW (ref 78.0–100.0)
Monocytes Absolute: 0.8 10*3/uL (ref 0.1–1.0)
Monocytes Relative: 7.7 % (ref 3.0–12.0)
Neutro Abs: 6.8 10*3/uL (ref 1.4–7.7)
Neutrophils Relative %: 66.3 % (ref 43.0–77.0)
Platelets: 445 10*3/uL — ABNORMAL HIGH (ref 150.0–400.0)
RBC: 5.07 Mil/uL (ref 3.87–5.11)
RDW: 20.2 % — ABNORMAL HIGH (ref 11.5–15.5)
WBC: 10.2 10*3/uL (ref 4.0–10.5)

## 2023-05-11 LAB — BASIC METABOLIC PANEL
BUN: 15 mg/dL (ref 6–23)
CO2: 25 meq/L (ref 19–32)
Calcium: 9.2 mg/dL (ref 8.4–10.5)
Chloride: 105 meq/L (ref 96–112)
Creatinine, Ser: 0.94 mg/dL (ref 0.40–1.20)
GFR: 59.56 mL/min — ABNORMAL LOW (ref >60.00)
Glucose, Bld: 98 mg/dL (ref 70–99)
Potassium: 4.2 meq/L (ref 3.5–5.1)
Sodium: 138 meq/L (ref 135–145)

## 2023-05-11 LAB — HEPATIC FUNCTION PANEL
ALT: 12 U/L (ref 0–35)
AST: 14 U/L (ref 0–37)
Albumin: 3.9 g/dL (ref 3.5–5.2)
Alkaline Phosphatase: 93 U/L (ref 39–117)
Bilirubin, Direct: 0 mg/dL (ref 0.0–0.3)
Total Bilirubin: 0.3 mg/dL (ref 0.2–1.2)
Total Protein: 7.5 g/dL (ref 6.0–8.3)

## 2023-05-11 LAB — LIPID PANEL
Cholesterol: 209 mg/dL — ABNORMAL HIGH (ref 0–200)
HDL: 49.5 mg/dL (ref >39.00)
LDL Cholesterol: 131 mg/dL — ABNORMAL HIGH (ref 0–99)
NonHDL: 159.9
Total CHOL/HDL Ratio: 4
Triglycerides: 146 mg/dL (ref 0.0–149.0)
VLDL: 29.2 mg/dL (ref 0.0–40.0)

## 2023-05-11 LAB — HEMOGLOBIN A1C: Hgb A1c MFr Bld: 6.3 % (ref 4.6–6.5)

## 2023-05-11 MED ORDER — METOPROLOL SUCCINATE ER 100 MG PO TB24: ORAL_TABLET | ORAL | 3 refills | Status: DC

## 2023-05-11 MED ORDER — OMEPRAZOLE 40 MG PO CPDR: DELAYED_RELEASE_CAPSULE | ORAL | 3 refills | Status: DC

## 2023-05-11 MED ORDER — MONTELUKAST SODIUM 10 MG PO TABS: 10.0000 mg | ORAL_TABLET | Freq: Every day | ORAL | 3 refills | Status: DC

## 2023-05-11 MED ORDER — BUDESONIDE-FORMOTEROL FUMARATE 80-4.5 MCG/ACT IN AERO: INHALATION_SPRAY | RESPIRATORY_TRACT | 3 refills | Status: AC

## 2023-05-11 MED ORDER — TEMAZEPAM 30 MG PO CAPS: 30.0000 mg | ORAL_CAPSULE | Freq: Every day | ORAL | 1 refills | Status: DC

## 2023-05-11 MED ORDER — AMLODIPINE BESYLATE 5 MG PO TABS: 5.0000 mg | ORAL_TABLET | Freq: Every day | ORAL | 3 refills | Status: DC

## 2023-05-11 MED ORDER — IPRATROPIUM-ALBUTEROL 0.5-2.5 (3) MG/3ML IN SOLN: 3.0000 mL | RESPIRATORY_TRACT | 3 refills | Status: AC | PRN

## 2023-05-11 MED ORDER — ARMOUR THYROID 60 MG PO TABS: 60.0000 mg | ORAL_TABLET | Freq: Every day | ORAL | 3 refills | Status: DC

## 2023-05-11 MED ORDER — ALBUTEROL SULFATE HFA 108 (90 BASE) MCG/ACT IN AERS: INHALATION_SPRAY | RESPIRATORY_TRACT | 5 refills | Status: DC

## 2023-05-11 NOTE — Addendum Note (Signed)
Addended by: Carola Rhine on: 05/11/2023 11:46 AM   Modules accepted: Orders

## 2023-05-11 NOTE — Progress Notes (Signed)
   Subjective:    Patient ID: Whitney Evans, female    DOB: 1945-10-16, 77 y.o.   MRN: [id number]  HPI Here for a well exam. She feels fine. She works out at [employer] 5 days a week.    Review of Systems  Constitutional: Negative.   HENT: Negative.    Eyes: Negative.   Respiratory: Negative.    Cardiovascular: Negative.   Gastrointestinal: Negative.   Genitourinary:  Negative for decreased urine volume, difficulty urinating, dyspareunia, dysuria, enuresis, flank pain, frequency, hematuria, pelvic pain and urgency.  Musculoskeletal: Negative.   Skin: Negative.   Neurological: Negative.  Negative for headaches.  Psychiatric/Behavioral: Negative.         Objective:   Physical Exam Constitutional:      General: She is not in acute distress.    Appearance: Normal appearance. She is well-developed.  HENT:     Head: Normocephalic and atraumatic.     Right Ear: External ear normal.     Left Ear: External ear normal.     Nose: Nose normal.     Mouth/Throat:     Pharynx: No oropharyngeal exudate.  Eyes:     General: No scleral icterus.    Conjunctiva/sclera: Conjunctivae normal.     Pupils: Pupils are equal, round, and reactive to light.  Neck:     Thyroid: No thyromegaly.     Vascular: No JVD.  Cardiovascular:     Rate and Rhythm: Normal rate and regular rhythm.     Pulses: Normal pulses.     Heart sounds: Normal heart sounds. No murmur heard.    No friction rub. No gallop.  Pulmonary:     Effort: Pulmonary effort is normal. No respiratory distress.     Breath sounds: Normal breath sounds. No wheezing or rales.  Chest:     Chest wall: No tenderness.  Abdominal:     General: Bowel sounds are normal. There is no distension.     Palpations: Abdomen is soft. There is no mass.     Tenderness: There is no abdominal tenderness. There is no guarding or rebound.  Musculoskeletal:        General: No tenderness. Normal range of motion.     Cervical back: Normal range of  motion and neck supple.  Lymphadenopathy:     Cervical: No cervical adenopathy.  Skin:    General: Skin is warm and dry.     Findings: No erythema or rash.  Neurological:     General: No focal deficit present.     Mental Status: She is alert and oriented to person, place, and time.     Cranial Nerves: No cranial nerve deficit.     Motor: No abnormal muscle tone.     Coordination: Coordination normal.     Deep Tendon Reflexes: Reflexes are normal and symmetric. Reflexes normal.  Psychiatric:        Mood and Affect: Mood normal.        Behavior: Behavior normal.        Thought Content: Thought content normal.        Judgment: Judgment normal.           Assessment & Plan:  Well exam. We discussed diet and exercise. Get fasting labs. She declines any colon cancer screening.  Gershon Crane, MD

## 2023-05-12 ENCOUNTER — Other Ambulatory Visit: Payer: Self-pay | Admitting: Family Medicine

## 2023-05-12 LAB — TSH: TSH: 3.71 u[IU]/mL (ref 0.35–5.50)

## 2023-05-12 LAB — T3, FREE: T3, Free: 4 pg/mL (ref 2.3–4.2)

## 2023-05-12 LAB — T4, FREE: Free T4: 0.66 ng/dL (ref 0.60–1.60)

## 2023-05-13 NOTE — Addendum Note (Signed)
Addended by: Gershon Crane A on: 05/13/2023 07:49 AM   Modules accepted: Orders

## 2023-05-18 ENCOUNTER — Other Ambulatory Visit: Payer: Self-pay

## 2023-05-28 DIAGNOSIS — Z1231 Encounter for screening mammogram for malignant neoplasm of breast: Secondary | ICD-10-CM | POA: Diagnosis not present

## 2023-05-31 ENCOUNTER — Encounter: Payer: Self-pay | Admitting: Family Medicine

## 2023-06-09 LAB — CUP PACEART REMOTE DEVICE CHECK
Battery Remaining Longevity: 37 mo
Battery Remaining Percentage: 35 %
Battery Voltage: 2.95 V
Brady Statistic AP VP Percent: 24 %
Brady Statistic AP VS Percent: 1 %
Brady Statistic AS VP Percent: 76 %
Brady Statistic AS VS Percent: 1 %
Brady Statistic RA Percent Paced: 24 %
Brady Statistic RV Percent Paced: 99 %
Date Time Interrogation Session: 20241002020014
Implantable Lead Connection Status: 753985
Implantable Lead Connection Status: 753985
Implantable Lead Implant Date: 20171012
Implantable Lead Implant Date: 20171012
Implantable Lead Location: 753859
Implantable Lead Location: 753860
Implantable Pulse Generator Implant Date: 20171012
Lead Channel Impedance Value: 400 Ohm
Lead Channel Impedance Value: 600 Ohm
Lead Channel Pacing Threshold Amplitude: 0.5 V
Lead Channel Pacing Threshold Amplitude: 0.5 V
Lead Channel Pacing Threshold Pulse Width: 0.5 ms
Lead Channel Pacing Threshold Pulse Width: 0.5 ms
Lead Channel Sensing Intrinsic Amplitude: 2.1 mV
Lead Channel Sensing Intrinsic Amplitude: 4 mV
Lead Channel Setting Pacing Amplitude: 2 V
Lead Channel Setting Pacing Amplitude: 2.5 V
Lead Channel Setting Pacing Pulse Width: 0.5 ms
Lead Channel Setting Sensing Sensitivity: 2 mV
Pulse Gen Model: 2272
Pulse Gen Serial Number: 7955201

## 2023-06-10 ENCOUNTER — Ambulatory Visit (INDEPENDENT_AMBULATORY_CARE_PROVIDER_SITE_OTHER): Payer: PPO

## 2023-06-10 DIAGNOSIS — I442 Atrioventricular block, complete: Secondary | ICD-10-CM

## 2023-06-23 ENCOUNTER — Encounter: Payer: Self-pay | Admitting: Family Medicine

## 2023-06-23 DIAGNOSIS — R922 Inconclusive mammogram: Secondary | ICD-10-CM | POA: Diagnosis not present

## 2023-06-23 DIAGNOSIS — N6321 Unspecified lump in the left breast, upper outer quadrant: Secondary | ICD-10-CM | POA: Diagnosis not present

## 2023-06-23 LAB — HM MAMMOGRAPHY

## 2023-06-25 NOTE — Progress Notes (Signed)
Remote pacemaker transmission.   

## 2023-07-01 ENCOUNTER — Other Ambulatory Visit: Payer: Self-pay

## 2023-07-01 DIAGNOSIS — R921 Mammographic calcification found on diagnostic imaging of breast: Secondary | ICD-10-CM | POA: Diagnosis not present

## 2023-07-01 DIAGNOSIS — N6012 Diffuse cystic mastopathy of left breast: Secondary | ICD-10-CM | POA: Diagnosis not present

## 2023-07-01 DIAGNOSIS — R928 Other abnormal and inconclusive findings on diagnostic imaging of breast: Secondary | ICD-10-CM | POA: Diagnosis not present

## 2023-07-02 LAB — SURGICAL PATHOLOGY

## 2023-07-09 ENCOUNTER — Encounter: Payer: Self-pay | Admitting: Family Medicine

## 2023-08-30 ENCOUNTER — Ambulatory Visit: Payer: PPO

## 2023-08-30 VITALS — Ht 64.0 in | Wt 182.0 lb

## 2023-08-30 DIAGNOSIS — Z0000 Encounter for general adult medical examination without abnormal findings: Secondary | ICD-10-CM | POA: Diagnosis not present

## 2023-08-30 NOTE — Progress Notes (Signed)
Subjective:   Whitney Evans is a 77 y.o. female who presents for Medicare Annual (Subsequent) preventive examination.  Visit Complete: Virtual I connected with  [name] on 08/30/23 by a audio enabled telemedicine application and verified that I am speaking with the correct person using two identifiers.  Patient Location: Home  Provider Location: Home Office  I discussed the limitations of evaluation and management by telemedicine. The patient expressed understanding and agreed to proceed.  Vital Signs: Because this visit was a virtual/telehealth visit, some criteria may be missing or patient reported. Any vitals not documented were not able to be obtained and vitals that have been documented are patient reported.    Cardiac Risk Factors include: advanced age (>42men, >40 women);hypertension     Objective:    Today's Vitals   08/30/23 0923  Weight: 182 lb (82.6 kg)  Height: 5\' 4"  (1.626 m)   Body mass index is 31.24 kg/m.     08/30/2023    9:29 AM 08/25/2022    8:26 AM 08/18/2021    2:31 PM 08/22/2020    8:29 AM 08/21/2019    8:38 AM 03/25/2017   10:22 AM 06/17/2016   10:00 PM  Advanced Directives  Does Patient Have a Medical Advance Directive? Yes Yes No Yes No Yes Yes  Type of Estate agent of [place];Living will Healthcare Power of [place];Living will    Healthcare Power of [place];Living will   Does patient want to make changes to medical advance directive?    Yes (MAU/Ambulatory/Procedural Areas - Information given)  No - Patient declined   Copy of Healthcare Power of Attorney in Chart? No - copy requested No - copy requested    No - copy requested   Would patient like information on creating a medical advance directive?     Yes (MAU/Ambulatory/Procedural Areas - Information given)      Current Medications (verified) Outpatient Encounter Medications as of 08/30/2023  Medication Sig   albuterol (PROAIR HFA) 108 (90 Base)  MCG/ACT inhaler TAKE 2 PUFFS BY MOUTH EVERY 4 HOURS AS NEEDED   amLODipine (NORVASC) 5 MG tablet Take 1 tablet (5 mg total) by mouth daily.   ARMOUR THYROID 60 MG tablet TAKE 1 TABLET BY MOUTH EVERY DAY   budesonide-formoterol (SYMBICORT) 80-4.5 MCG/ACT inhaler INHALE TWO PUFFS into THE lungs TWICE DAILY   Calcium Carbonate-Vit D-Min (CALCIUM 1200 PO) Take 1,200 mg by mouth daily.   Cholecalciferol 25 MCG (1000 UT) tablet Take 1,000 Units by mouth daily.   Coenzyme Q10 (CO Q 10 PO) Take 100 mg by mouth daily.    ipratropium-albuterol (DUONEB) 0.5-2.5 (3) MG/3ML SOLN Take 3 mLs by nebulization every 4 (four) hours as needed (asthma).   MAGNESIUM PO Take 450 mg by mouth at bedtime.    metoprolol succinate (TOPROL-XL) 100 MG 24 hr tablet TAKE ONE TABLET BY MOUTH ONCE DAILY WITH FOOD   montelukast (SINGULAIR) 10 MG tablet Take 1 tablet (10 mg total) by mouth daily.   Multiple Vitamin (MULTIVITAMIN WITH MINERALS) TABS tablet Take 1 tablet by mouth daily.   Omega-3 Fatty Acids (FISH OIL PO) Take 1,300 mg by mouth daily.    omeprazole (PRILOSEC) 40 MG capsule TAKE ONE CAPSULE BY MOUTH ONCE DAILY   temazepam (RESTORIL) 30 MG capsule Take 1 capsule (30 mg total) by mouth at bedtime.   No facility-administered encounter medications on file as of 08/30/2023.    Allergies (verified) Morphine sulfate, Pneumococcal vaccine polyvalent, and Shellfish-derived products  History: Past Medical History:  Diagnosis Date   Allergic rhinitis    Asthma    Chronic hoarseness    sees Dr. [name] (ENT) in [facility]    Essential hypertension    GERD (gastroesophageal reflux disease)    Hypothyroidism    Insomnia    Neck pain, chronic    Past Surgical History:  Procedure Laterality Date   CESAREAN SECTION     x 2   COLONOSCOPY  05/24/07   per Dr. Russella Dar, benign polpys, and diverticulosis, repeat 5 years    EP IMPLANTABLE DEVICE N/A 06/18/2016   Procedure: Pacemaker Implant;  Surgeon: Will [name],  MD;  Location: [facility] INVASIVE CV LAB;  Service: Cardiovascular;  Laterality: N/A;   EYE SURGERY     left foot surgery     VESICOVAGINAL FISTULA CLOSURE W/ TAH     WISDOM TOOTH EXTRACTION     Family History  Problem Relation Age of Onset   Stroke Mother    Heart attack Father 28   Coronary artery disease Other    Diabetes Other    Hyperlipidemia Other    Hypertension Other    Stroke Other    Atrial fibrillation Granddaughter    Atrial fibrillation Grandson    Social History   Socioeconomic History   Marital status: Divorced    Spouse name: Not on file   Number of children: 2   Years of education: 4 years college   Highest education level: Bachelor's degree (e.g., BA, AB, BS)  Occupational History   Occupation: retired respiratory therapist  Tobacco Use   Smoking status: Former   Smokeless tobacco: Never   Tobacco comments:    Former smoker 12/23/22  Vaping Use   Vaping status: Never Used  Substance and Sexual Activity   Alcohol use: Yes    Alcohol/week: 2.0 standard drinks of alcohol    Types: 2 Standard drinks or equivalent per week    Comment: glass of red wine   Drug use: No   Sexual activity: Not on file  Other Topics Concern   Not on file  Social History Narrative   Retired respiratory therapist; divorced   1 daughter [place], [place] ICU nurse   1 son in [place] - Acupuncturist   Social Drivers of Health   Financial Resource Strain: Low Risk  (08/30/2023)   Overall Financial Resource Strain (CARDIA)    Difficulty of Paying Living Expenses: Not hard at all  Food Insecurity: No Food Insecurity (08/30/2023)   Hunger Vital Sign    Worried About Running Out of Food in the Last Year: Never true    Ran Out of Food in the Last Year: Never true  Transportation Needs: No Transportation Needs (08/30/2023)   PRAPARE - Administrator, Civil Service (Medical): No    Lack of Transportation (Non-Medical): No  Physical Activity: Sufficiently Active  (08/30/2023)   Exercise Vital Sign    Days of Exercise per Week: 3 days    Minutes of Exercise per Session: 60 min  Stress: No Stress Concern Present (08/30/2023)   [employer] of Occupational Health - Occupational Stress Questionnaire    Feeling of Stress : Not at all  Social Connections: Moderately Integrated (08/30/2023)   Social Connection and Isolation Panel [NHANES]    Frequency of Communication with Friends and Family: More than three times a week    Frequency of Social Gatherings with Friends and Family: More than three times a week    Attends Religious  Services: More than 4 times per year    Active Member of Clubs or Organizations: Yes    Attends Banker Meetings: More than 4 times per year    Marital Status: Divorced    Tobacco Counseling Counseling given: Not Answered Tobacco comments: Former smoker 12/23/22   Clinical Intake:  Pre-visit preparation completed: Yes  Pain : No/denies pain     BMI - recorded: 31.24 Nutritional Risks: None Diabetes: No  How often do you need to have someone help you when you read instructions, pamphlets, or other written materials from your doctor or pharmacy?: 1 - Never  Interpreter Needed?: No  Information entered by :: Theresa Mulligan LPN   Activities of Daily Living    08/30/2023    9:28 AM  In your present state of health, do you have any difficulty performing the following activities:  Hearing? 0  Vision? 0  Difficulty concentrating or making decisions? 0  Walking or climbing stairs? 0  Dressing or bathing? 0  Doing errands, shopping? 0  Preparing Food and eating ? N  Using the Toilet? N  In the past six months, have you accidently leaked urine? N  Do you have problems with loss of bowel control? N  Managing your Medications? N  Managing your Finances? N  Housekeeping or managing your Housekeeping? N    Patient Care Team: Nelwyn Salisbury, MD as PCP - General (Family Medicine) Regan Lemming, MD as PCP - Electrophysiology (Cardiology) Verner Chol, [facility] (Inactive) as Pharmacist (Pharmacist)  Indicate any recent Medical Services you may have received from other than [facility] providers in the past year (date may be approximate).     Assessment:   This is a routine wellness examination for T[place]ka.  Hearing/Vision screen Hearing Screening - Comments:: Denies hearing difficulties   Vision Screening - Comments:: Wears rx glasses - up to date with routine eye exams with  [facility]   Goals Addressed               This Visit's Progress     Increase physical activity (pt-stated)         Depression Screen    08/30/2023    9:27 AM 08/25/2022    8:24 AM 09/10/2021   12:19 PM 08/18/2021    2:32 PM 08/22/2020    8:31 AM 08/21/2019    8:43 AM 03/25/2017   10:23 AM  PHQ 2/9 Scores  PHQ - 2 Score 0 0 0 0 0 2 1  PHQ- 9 Score   1  0 3     Fall Risk    08/30/2023    9:28 AM 08/25/2022    8:25 AM 08/21/2022    8:42 AM 09/10/2021   12:19 PM 08/18/2021    2:32 PM  Fall Risk   Falls in the past year? 0 0 0 0 0  Number falls in past yr: 0 0  0   Injury with Fall? 0 0  0   Risk for fall due to : No Fall Risks No Fall Risks   Medication side effect  Follow up Falls prevention discussed Falls prevention discussed  Falls evaluation completed Falls evaluation completed;Education provided;Falls prevention discussed    MEDICARE RISK AT HOME: Medicare Risk at Home Any stairs in or around the home?: No If so, are there any without handrails?: No Home free of loose throw rugs in walkways, pet beds, electrical cords, etc?: Yes Adequate lighting in your home to reduce risk of  falls?: Yes Life alert?: No Use of a cane, walker or w/c?: No Grab bars in the bathroom?: No Shower chair or bench in shower?: No Elevated toilet seat or a handicapped toilet?: No  TIMED UP AND GO:  Was the test performed?  No    Cognitive Function:        08/30/2023    9:29 AM 08/25/2022     8:26 AM 08/18/2021    2:33 PM 08/21/2019    8:49 AM  6CIT Screen  What Year? 0 points 0 points 0 points 0 points  What month? 0 points 0 points 0 points 0 points  What time? 0 points 0 points 0 points   Count back from 20 0 points 0 points 0 points 0 points  Months in reverse 0 points 0 points 0 points 0 points  Repeat phrase 0 points 0 points 4 points 0 points  Total Score 0 points 0 points 4 points     Immunizations Immunization History  Administered Date(s) Administered   Fluad Quad(high Dose 65+) 09/10/2021   Fluad Trivalent(High Dose 65+) 05/11/2023   Influenza, High Dose Seasonal PF 07/24/2016, 04/14/2017, 05/04/2019   Influenza,inj,Quad PF,6+ Mos 07/03/2014   Influenza-Unspecified 06/11/2015, 04/14/2017, 04/20/2018, 05/04/2019   PFIZER(Purple Top)SARS-COV-2 Vaccination 10/30/2019, 11/20/2019, 08/03/2020   Tdap 09/23/2015   Zoster Recombinant(Shingrix) 05/04/2019, 08/24/2019    TDAP status: Up to date  Flu Vaccine status: Up to date  Pneumococcal vaccine status: Due, Education has been provided regarding the importance of this vaccine. Advised may receive this vaccine at local pharmacy or Health Dept. Aware to provide a copy of the vaccination record if obtained from local pharmacy or Health Dept. Verbalized acceptance and understanding.  Covid-19 vaccine status: Declined, Education has been provided regarding the importance of this vaccine but patient still declined. Advised may receive this vaccine at local pharmacy or Health Dept.or vaccine clinic. Aware to provide a copy of the vaccination record if obtained from local pharmacy or Health Dept. Verbalized acceptance and understanding.  Qualifies for Shingles Vaccine? Yes   Zostavax completed Yes   Shingrix Completed?: Yes  Screening Tests Health Maintenance  Topic Date Due   Pneumonia Vaccine 41+ Years old (1 of 2 - PCV) Never done   Colonoscopy  05/23/2017   COVID-19 Vaccine (4 - 2024-25 season) 05/09/2023    Medicare Annual Wellness (AWV)  08/29/2024   DTaP/Tdap/Td (2 - Td or Tdap) 09/22/2025   INFLUENZA VACCINE  Completed   DEXA SCAN  Completed   Hepatitis C Screening  Completed   Zoster Vaccines- Shingrix  Completed   HPV VACCINES  Aged Out    Health Maintenance  Health Maintenance Due  Topic Date Due   Pneumonia Vaccine 79+ Years old (1 of 2 - PCV) Never done   Colonoscopy  05/23/2017   COVID-19 Vaccine (4 - 2024-25 season) 05/09/2023    Colorectal cancer screening: Referral to GI placed Deferred. Pt aware the office will call re: appt.    Bone Density status: Ordered 12/24/16. Pt provided with contact info and advised to call to schedule appt.     Additional Screening:  Hepatitis C Screening: does qualify; Completed 10/21/17  Vision Screening: Recommended annual ophthalmology exams for early detection of glaucoma and other disorders of the eye. Is the patient up to date with their annual eye exam?  Yes  Who is the provider or what is the name of the office in which the patient attends annual eye exams? [facility] If pt is not established  with a provider, would they like to be referred to a provider to establish care? No .   Dental Screening: Recommended annual dental exams for proper oral hygiene   Community Resource Referral / Chronic Care Management:  CRR required this visit?  No   CCM required this visit?  No     Plan:     I have personally reviewed and noted the following in the patient's chart:   Medical and social history Use of alcohol, tobacco or illicit drugs  Current medications and supplements including opioid prescriptions. Patient is not currently taking opioid prescriptions. Functional ability and status Nutritional status Physical activity Advanced directives List of other physicians Hospitalizations, surgeries, and ER visits in previous 12 months Vitals Screenings to include cognitive, depression, and falls Referrals and appointments  In  addition, I have reviewed and discussed with patient certain preventive protocols, quality metrics, and best practice recommendations. A written personalized care plan for preventive services as well as general preventive health recommendations were provided to patient.     Tillie Rung, LPN   [id number]   After Visit Summary: (MyChart) Due to this being a telephonic visit, the after visit summary with patients personalized plan was offered to patient via MyChart   Nurse Notes: None

## 2023-08-30 NOTE — Patient Instructions (Addendum)
Ms. Sehnert , Thank you for taking time to come for your Medicare Wellness Visit. I appreciate your ongoing commitment to your health goals. Please review the following plan we discussed and let me know if I can assist you in the future.   Referrals/Orders/Follow-Ups/Clinician Recommendations:   This is a list of the screening recommended for you and due dates:  Health Maintenance  Topic Date Due   Pneumonia Vaccine (1 of 2 - PCV) Never done   Colon Cancer Screening  05/23/2017   COVID-19 Vaccine (4 - 2024-25 season) 05/09/2023   Medicare Annual Wellness Visit  08/29/2024   DTaP/Tdap/Td vaccine (2 - Td or Tdap) 09/22/2025   Flu Shot  Completed   DEXA scan (bone density measurement)  Completed   Hepatitis C Screening  Completed   Zoster (Shingles) Vaccine  Completed   HPV Vaccine  Aged Out    Advanced directives: (Copy Requested) Please bring a copy of your health care power of attorney and living will to the office to be added to your chart at your convenience.  Next Medicare Annual Wellness Visit scheduled for next year: Yes

## 2023-09-10 ENCOUNTER — Ambulatory Visit (INDEPENDENT_AMBULATORY_CARE_PROVIDER_SITE_OTHER): Payer: PPO

## 2023-09-10 DIAGNOSIS — I442 Atrioventricular block, complete: Secondary | ICD-10-CM

## 2023-09-10 LAB — CUP PACEART REMOTE DEVICE CHECK
Battery Remaining Longevity: 35 mo
Battery Remaining Percentage: 33 %
Battery Voltage: 2.95 V
Brady Statistic AP VP Percent: 26 %
Brady Statistic AP VS Percent: 1 %
Brady Statistic AS VP Percent: 74 %
Brady Statistic AS VS Percent: 1 %
Brady Statistic RA Percent Paced: 26 %
Brady Statistic RV Percent Paced: 99 %
Date Time Interrogation Session: 20250103034150
Implantable Lead Connection Status: 753985
Implantable Lead Connection Status: 753985
Implantable Lead Implant Date: 20171012
Implantable Lead Implant Date: 20171012
Implantable Lead Location: 753859
Implantable Lead Location: 753860
Implantable Pulse Generator Implant Date: 20171012
Lead Channel Impedance Value: 430 Ohm
Lead Channel Impedance Value: 630 Ohm
Lead Channel Pacing Threshold Amplitude: 0.5 V
Lead Channel Pacing Threshold Amplitude: 0.5 V
Lead Channel Pacing Threshold Pulse Width: 0.5 ms
Lead Channel Pacing Threshold Pulse Width: 0.5 ms
Lead Channel Sensing Intrinsic Amplitude: 1.8 mV
Lead Channel Sensing Intrinsic Amplitude: 4 mV
Lead Channel Setting Pacing Amplitude: 2 V
Lead Channel Setting Pacing Amplitude: 2.5 V
Lead Channel Setting Pacing Pulse Width: 0.5 ms
Lead Channel Setting Sensing Sensitivity: 2 mV
Pulse Gen Model: 2272
Pulse Gen Serial Number: 7955201

## 2023-10-19 NOTE — Progress Notes (Signed)
Remote pacemaker transmission.

## 2023-11-08 ENCOUNTER — Other Ambulatory Visit: Payer: Self-pay | Admitting: Family Medicine

## 2023-12-13 ENCOUNTER — Ambulatory Visit (INDEPENDENT_AMBULATORY_CARE_PROVIDER_SITE_OTHER): Payer: PPO

## 2023-12-13 DIAGNOSIS — I442 Atrioventricular block, complete: Secondary | ICD-10-CM | POA: Diagnosis not present

## 2023-12-14 LAB — CUP PACEART REMOTE DEVICE CHECK
Battery Remaining Longevity: 32 mo
Battery Remaining Percentage: 30 %
Battery Voltage: 2.95 V
Brady Statistic AP VP Percent: 26 %
Brady Statistic AP VS Percent: 1 %
Brady Statistic AS VP Percent: 74 %
Brady Statistic AS VS Percent: 1 %
Brady Statistic RA Percent Paced: 26 %
Brady Statistic RV Percent Paced: 99 %
Date Time Interrogation Session: 20250407020013
Implantable Lead Connection Status: 753985
Implantable Lead Connection Status: 753985
Implantable Lead Implant Date: 20171012
Implantable Lead Implant Date: 20171012
Implantable Lead Location: 753859
Implantable Lead Location: 753860
Implantable Pulse Generator Implant Date: 20171012
Lead Channel Impedance Value: 440 Ohm
Lead Channel Impedance Value: 640 Ohm
Lead Channel Pacing Threshold Amplitude: 0.5 V
Lead Channel Pacing Threshold Amplitude: 0.5 V
Lead Channel Pacing Threshold Pulse Width: 0.5 ms
Lead Channel Pacing Threshold Pulse Width: 0.5 ms
Lead Channel Sensing Intrinsic Amplitude: 1.9 mV
Lead Channel Sensing Intrinsic Amplitude: 10.6 mV
Lead Channel Setting Pacing Amplitude: 2 V
Lead Channel Setting Pacing Amplitude: 2.5 V
Lead Channel Setting Pacing Pulse Width: 0.5 ms
Lead Channel Setting Sensing Sensitivity: 2 mV
Pulse Gen Model: 2272
Pulse Gen Serial Number: 7955201

## 2024-01-06 ENCOUNTER — Other Ambulatory Visit: Payer: Self-pay | Admitting: Family Medicine

## 2024-02-01 NOTE — Progress Notes (Signed)
 Remote pacemaker transmission.

## 2024-02-01 NOTE — Addendum Note (Signed)
 Addended by: Edra Govern D on: 02/01/2024 12:51 PM   Modules accepted: Orders

## 2024-03-14 ENCOUNTER — Ambulatory Visit: Payer: PPO

## 2024-03-14 DIAGNOSIS — I442 Atrioventricular block, complete: Secondary | ICD-10-CM

## 2024-03-16 LAB — CUP PACEART REMOTE DEVICE CHECK
Battery Remaining Longevity: 30 mo
Battery Remaining Percentage: 27 %
Battery Voltage: 2.93 V
Brady Statistic AP VP Percent: 26 %
Brady Statistic AP VS Percent: 1 %
Brady Statistic AS VP Percent: 74 %
Brady Statistic AS VS Percent: 1 %
Brady Statistic RA Percent Paced: 26 %
Brady Statistic RV Percent Paced: 99 %
Date Time Interrogation Session: 20250708020528
Implantable Lead Connection Status: 753985
Implantable Lead Connection Status: 753985
Implantable Lead Implant Date: 20171012
Implantable Lead Implant Date: 20171012
Implantable Lead Location: 753859
Implantable Lead Location: 753860
Implantable Pulse Generator Implant Date: 20171012
Lead Channel Impedance Value: 430 Ohm
Lead Channel Impedance Value: 650 Ohm
Lead Channel Pacing Threshold Amplitude: 0.5 V
Lead Channel Pacing Threshold Amplitude: 0.5 V
Lead Channel Pacing Threshold Pulse Width: 0.5 ms
Lead Channel Pacing Threshold Pulse Width: 0.5 ms
Lead Channel Sensing Intrinsic Amplitude: 10.6 mV
Lead Channel Sensing Intrinsic Amplitude: 2.1 mV
Lead Channel Setting Pacing Amplitude: 2 V
Lead Channel Setting Pacing Amplitude: 2.5 V
Lead Channel Setting Pacing Pulse Width: 0.5 ms
Lead Channel Setting Sensing Sensitivity: 2 mV
Pulse Gen Model: 2272
Pulse Gen Serial Number: 7955201

## 2024-03-23 ENCOUNTER — Ambulatory Visit: Payer: Self-pay | Admitting: Cardiology

## 2024-05-04 ENCOUNTER — Other Ambulatory Visit: Payer: Self-pay | Admitting: Family Medicine

## 2024-05-06 ENCOUNTER — Other Ambulatory Visit: Payer: Self-pay | Admitting: Medical Genetics

## 2024-05-08 ENCOUNTER — Other Ambulatory Visit: Payer: Self-pay | Admitting: Family Medicine

## 2024-05-11 ENCOUNTER — Ambulatory Visit: Payer: Self-pay | Admitting: Family Medicine

## 2024-05-11 ENCOUNTER — Other Ambulatory Visit: Payer: Self-pay

## 2024-05-11 ENCOUNTER — Encounter: Payer: Self-pay | Admitting: Family Medicine

## 2024-05-11 ENCOUNTER — Ambulatory Visit: Payer: PPO | Admitting: Family Medicine

## 2024-05-11 VITALS — BP 110/70 | HR 87 | Temp 98.3°F | Ht 65.0 in | Wt 182.0 lb

## 2024-05-11 DIAGNOSIS — Z23 Encounter for immunization: Secondary | ICD-10-CM | POA: Diagnosis not present

## 2024-05-11 DIAGNOSIS — Z0000 Encounter for general adult medical examination without abnormal findings: Secondary | ICD-10-CM

## 2024-05-11 DIAGNOSIS — E039 Hypothyroidism, unspecified: Secondary | ICD-10-CM

## 2024-05-11 LAB — CBC WITH DIFFERENTIAL/PLATELET
Basophils Absolute: 0.1 K/uL (ref 0.0–0.1)
Basophils Relative: 1.3 % (ref 0.0–3.0)
Eosinophils Absolute: 0.1 K/uL (ref 0.0–0.7)
Eosinophils Relative: 1.4 % (ref 0.0–5.0)
HCT: 44.6 % (ref 36.0–46.0)
Hemoglobin: 14.7 g/dL (ref 12.0–15.0)
Lymphocytes Relative: 35.7 % (ref 12.0–46.0)
Lymphs Abs: 2.3 K/uL (ref 0.7–4.0)
MCHC: 32.9 g/dL (ref 30.0–36.0)
MCV: 88 fl (ref 78.0–100.0)
Monocytes Absolute: 0.5 K/uL (ref 0.1–1.0)
Monocytes Relative: 8.3 % (ref 3.0–12.0)
Neutro Abs: 3.4 K/uL (ref 1.4–7.7)
Neutrophils Relative %: 53.3 % (ref 43.0–77.0)
Platelets: 347 K/uL (ref 150.0–400.0)
RBC: 5.07 Mil/uL (ref 3.87–5.11)
RDW: 14 % (ref 11.5–15.5)
WBC: 6.3 K/uL (ref 4.0–10.5)

## 2024-05-11 LAB — LIPID PANEL
Cholesterol: 232 mg/dL — ABNORMAL HIGH (ref 0–200)
HDL: 54.6 mg/dL (ref >39.00)
LDL Cholesterol: 144 mg/dL — ABNORMAL HIGH (ref 0–99)
NonHDL: 177.45
Total CHOL/HDL Ratio: 4
Triglycerides: 166 mg/dL — ABNORMAL HIGH (ref 0.0–149.0)
VLDL: 33.2 mg/dL (ref 0.0–40.0)

## 2024-05-11 LAB — BASIC METABOLIC PANEL WITH GFR
BUN: 15 mg/dL (ref 6–23)
CO2: 27 meq/L (ref 19–32)
Calcium: 9.1 mg/dL (ref 8.4–10.5)
Chloride: 102 meq/L (ref 96–112)
Creatinine, Ser: 0.92 mg/dL (ref 0.40–1.20)
GFR: 60.68 mL/min (ref >60.00)
Glucose, Bld: 117 mg/dL — ABNORMAL HIGH (ref 70–99)
Potassium: 4.6 meq/L (ref 3.5–5.1)
Sodium: 139 meq/L (ref 135–145)

## 2024-05-11 LAB — T4, FREE: Free T4: 0.56 ng/dL — ABNORMAL LOW (ref 0.60–1.60)

## 2024-05-11 LAB — HEPATIC FUNCTION PANEL
ALT: 17 U/L (ref 0–35)
AST: 17 U/L (ref 0–37)
Albumin: 4.1 g/dL (ref 3.5–5.2)
Alkaline Phosphatase: 67 U/L (ref 39–117)
Bilirubin, Direct: 0.1 mg/dL (ref 0.0–0.3)
Total Bilirubin: 0.4 mg/dL (ref 0.2–1.2)
Total Protein: 7 g/dL (ref 6.0–8.3)

## 2024-05-11 LAB — TSH: TSH: 3.64 u[IU]/mL (ref 0.35–5.50)

## 2024-05-11 LAB — T3, FREE: T3, Free: 3.8 pg/mL (ref 2.3–4.2)

## 2024-05-11 LAB — HEMOGLOBIN A1C: Hgb A1c MFr Bld: 6.3 % (ref 4.6–6.5)

## 2024-05-11 MED ORDER — TEMAZEPAM 30 MG PO CAPS: 30.0000 mg | ORAL_CAPSULE | Freq: Every day | ORAL | 1 refills | Status: DC

## 2024-05-11 NOTE — Addendum Note (Signed)
 Addended by: LADONNA INOCENTE SAILOR on: 05/11/2024 11:40 AM   Modules accepted: Orders

## 2024-05-11 NOTE — Progress Notes (Signed)
   Subjective:    Patient ID: Whitney Evans, female    DOB: 1946/02/12, 78 y.o.   MRN: [id number]  HPI Here for a well exam. She has no concerns. She sees Cardiology regularly to monitor her pacemaker and for her hx of atrial fibrillation. She gets yearly mammograms. She declines any further colon cancer screening.    Review of Systems  Constitutional: Negative.   HENT: Negative.    Eyes: Negative.   Respiratory: Negative.    Cardiovascular: Negative.   Gastrointestinal: Negative.   Genitourinary:  Negative for decreased urine volume, difficulty urinating, dyspareunia, dysuria, enuresis, flank pain, frequency, hematuria, pelvic pain and urgency.  Musculoskeletal: Negative.   Skin: Negative.   Neurological: Negative.  Negative for headaches.  Psychiatric/Behavioral: Negative.         Objective:   Physical Exam Constitutional:      General: She is not in acute distress.    Appearance: She is well-developed. She is obese.  HENT:     Head: Normocephalic and atraumatic.     Right Ear: External ear normal.     Left Ear: External ear normal.     Nose: Nose normal.     Mouth/Throat:     Pharynx: No oropharyngeal exudate.  Eyes:     General: No scleral icterus.    Conjunctiva/sclera: Conjunctivae normal.     Pupils: Pupils are equal, round, and reactive to light.  Neck:     Thyroid : No thyromegaly.     Vascular: No JVD.  Cardiovascular:     Rate and Rhythm: Normal rate and regular rhythm.     Pulses: Normal pulses.     Heart sounds: Normal heart sounds. No murmur heard.    No friction rub. No gallop.  Pulmonary:     Effort: Pulmonary effort is normal. No respiratory distress.     Breath sounds: Normal breath sounds. No wheezing or rales.  Chest:     Chest wall: No tenderness.  Abdominal:     General: Bowel sounds are normal. There is no distension.     Palpations: Abdomen is soft. There is no mass.     Tenderness: There is no abdominal tenderness. There is no  guarding or rebound.  Musculoskeletal:        General: No tenderness. Normal range of motion.     Cervical back: Normal range of motion and neck supple.  Lymphadenopathy:     Cervical: No cervical adenopathy.  Skin:    General: Skin is warm and dry.     Findings: No erythema or rash.  Neurological:     General: No focal deficit present.     Mental Status: She is alert and oriented to person, place, and time.     Cranial Nerves: No cranial nerve deficit.     Motor: No abnormal muscle tone.     Coordination: Coordination normal.     Deep Tendon Reflexes: Reflexes are normal and symmetric. Reflexes normal.  Psychiatric:        Mood and Affect: Mood normal.        Behavior: Behavior normal.        Thought Content: Thought content normal.        Judgment: Judgment normal.           Assessment & Plan:  Well exam. We discussed diet and exercise. Get fasting labs. Garnette Olmsted, MD

## 2024-05-17 ENCOUNTER — Other Ambulatory Visit: Payer: Self-pay

## 2024-05-17 MED ORDER — LEVOTHYROXINE SODIUM 50 MCG PO TABS: 50.0000 ug | ORAL_TABLET | Freq: Every day | ORAL | 3 refills | Status: AC

## 2024-05-19 ENCOUNTER — Other Ambulatory Visit: Payer: Self-pay

## 2024-05-19 DIAGNOSIS — E039 Hypothyroidism, unspecified: Secondary | ICD-10-CM

## 2024-06-02 DIAGNOSIS — Z1231 Encounter for screening mammogram for malignant neoplasm of breast: Secondary | ICD-10-CM | POA: Diagnosis not present

## 2024-06-02 LAB — HM MAMMOGRAPHY

## 2024-06-14 ENCOUNTER — Ambulatory Visit: Payer: PPO

## 2024-06-14 DIAGNOSIS — I480 Paroxysmal atrial fibrillation: Secondary | ICD-10-CM

## 2024-06-14 LAB — CUP PACEART REMOTE DEVICE CHECK
Battery Remaining Longevity: 28 mo
Battery Remaining Percentage: 25 %
Battery Voltage: 2.92 V
Brady Statistic AP VP Percent: 27 %
Brady Statistic AP VS Percent: 1 %
Brady Statistic AS VP Percent: 73 %
Brady Statistic AS VS Percent: 1 %
Brady Statistic RA Percent Paced: 27 %
Brady Statistic RV Percent Paced: 99 %
Date Time Interrogation Session: 20251007020034
Implantable Lead Connection Status: 753985
Implantable Lead Connection Status: 753985
Implantable Lead Implant Date: 20171012
Implantable Lead Implant Date: 20171012
Implantable Lead Location: 753859
Implantable Lead Location: 753860
Implantable Pulse Generator Implant Date: 20171012
Lead Channel Impedance Value: 440 Ohm
Lead Channel Impedance Value: 650 Ohm
Lead Channel Pacing Threshold Amplitude: 0.5 V
Lead Channel Pacing Threshold Amplitude: 0.5 V
Lead Channel Pacing Threshold Pulse Width: 0.5 ms
Lead Channel Pacing Threshold Pulse Width: 0.5 ms
Lead Channel Sensing Intrinsic Amplitude: 10.6 mV
Lead Channel Sensing Intrinsic Amplitude: 2 mV
Lead Channel Setting Pacing Amplitude: 2 V
Lead Channel Setting Pacing Amplitude: 2.5 V
Lead Channel Setting Pacing Pulse Width: 0.5 ms
Lead Channel Setting Sensing Sensitivity: 2 mV
Pulse Gen Model: 2272
Pulse Gen Serial Number: 7955201

## 2024-06-16 NOTE — Progress Notes (Signed)
 Remote PPM Transmission

## 2024-06-18 ENCOUNTER — Ambulatory Visit: Payer: Self-pay | Admitting: Cardiology

## 2024-07-07 ENCOUNTER — Other Ambulatory Visit (HOSPITAL_COMMUNITY)
Admission: RE | Admit: 2024-07-07 | Discharge: 2024-07-07 | Disposition: A | Discharge status: Home / Self Care | Payer: Self-pay | Source: Ambulatory Visit | Attending: Medical Genetics | Admitting: Medical Genetics

## 2024-07-17 LAB — GENECONNECT MOLECULAR SCREEN: Genetic Analysis Overall Interpretation: NEGATIVE

## 2024-08-10 ENCOUNTER — Other Ambulatory Visit: Payer: Self-pay | Admitting: Family Medicine

## 2024-08-11 ENCOUNTER — Other Ambulatory Visit

## 2024-08-11 DIAGNOSIS — E039 Hypothyroidism, unspecified: Secondary | ICD-10-CM | POA: Diagnosis not present

## 2024-08-11 LAB — TSH: TSH: 0.19 u[IU]/mL — ABNORMAL LOW (ref 0.35–5.50)

## 2024-08-11 LAB — T3, FREE: T3, Free: 3.1 pg/mL (ref 2.3–4.2)

## 2024-08-11 LAB — T4, FREE: Free T4: 0.71 ng/dL (ref 0.60–1.60)

## 2024-08-14 ENCOUNTER — Ambulatory Visit: Payer: Self-pay | Admitting: Family Medicine

## 2024-09-04 ENCOUNTER — Ambulatory Visit: Payer: PPO

## 2024-09-04 VITALS — Ht 65.0 in | Wt 185.0 lb

## 2024-09-04 DIAGNOSIS — Z0000 Encounter for general adult medical examination without abnormal findings: Secondary | ICD-10-CM

## 2024-09-04 NOTE — Progress Notes (Signed)
 "  Chief Complaint  Patient presents with   Medicare Wellness     Subjective:   Whitney Evans is a 78 y.o. female who presents for a Medicare Annual Wellness Visit.  Visit info / Clinical Intake: Medicare Wellness Visit Type:: Subsequent Annual Wellness Visit Persons participating in visit and providing information:: patient Medicare Wellness Visit Mode:: Telephone If telephone:: video declined Since this visit was completed virtually, some vitals may be partially provided or unavailable. Missing vitals are due to the limitations of the virtual format.: Documented vitals are patient reported If Telephone or Video please confirm:: I connected with patient using audio/video enable telemedicine. I verified patient identity with two identifiers, discussed telehealth limitations, and patient agreed to proceed. Patient Location:: Home Provider Location:: Office Interpreter Needed?: No Pre-visit prep was completed: yes AWV questionnaire completed by patient prior to visit?: yes Date:: 08/29/24 Living arrangements:: (!) lives alone Patient's Overall Health Status Rating: very good Typical amount of pain: some Does pain affect daily life?: no Are you currently prescribed opioids?: no  Dietary Habits and Nutritional Risks How many meals a day?: 3 Eats fruit and vegetables daily?: (!) no Most meals are obtained by: preparing own meals In the last 2 weeks, have you had any of the following?: none Diabetic:: no  Functional Status Activities of Daily Living (to include ambulation/medication): Independent Ambulation: Independent with device- listed below Home Assistive Devices/Equipment: Eyeglasses Medication Administration: Independent Home Management (perform basic housework or laundry): Independent Manage your own finances?: yes Primary transportation is: driving Concerns about vision?: no *vision screening is required for WTM* Concerns about hearing?: no  Fall  Screening Falls in the past year?: 0 Number of falls in past year: 0 Was there an injury with Fall?: 0 Fall Risk Category Calculator: 0 Patient Fall Risk Level: Low Fall Risk  Fall Risk Patient at Risk for Falls Due to: No Fall Risks Fall risk Follow up: Falls evaluation completed  Home and Transportation Safety: All rugs have non-skid backing?: yes All stairs or steps have railings?: yes Grab bars in the bathtub or shower?: (!) no Have non-skid surface in bathtub or shower?: yes Good home lighting?: yes Regular seat belt use?: yes Hospital stays in the last year:: no  Cognitive Assessment Difficulty concentrating, remembering, or making decisions? : no Will 6CIT or Mini Cog be Completed: yes What year is it?: 0 points What month is it?: 0 points Give patient an address phrase to remember (5 components): [address] [place]  About what time is it?: 0 points Count backwards from 20 to 1: 0 points Say the months of the year in reverse: 0 points Repeat the address phrase from earlier: 0 points 6 CIT Score: 0 points  Advance Directives (For Healthcare) Does Patient Have a Medical Advance Directive?: Yes Does patient want to make changes to medical advance directive?: No - Patient declined Type of Advance Directive: Healthcare Power of [place]; Living will Copy of Healthcare Power of Attorney in Chart?: No - copy requested Copy of Living Will in Chart?: No - copy requested  Reviewed/Updated  Reviewed/Updated: Reviewed All (Medical, Surgical, Family, Medications, Allergies, Care Teams, Patient Goals)    Allergies (verified) Morphine sulfate, Pneumococcal vaccine polyvalent, and Shellfish protein-containing drug products   Current Medications (verified) Outpatient Encounter Medications as of 09/04/2024  Medication Sig   albuterol  (VENTOLIN  HFA) 108 (90 Base) MCG/ACT inhaler INHALE 2 PUFFS BY MOUTH EVERY 4 HOURS AS NEEDED   amLODipine  (NORVASC ) 5 MG tablet TAKE 1  TABLET (  5 MG TOTAL) BY MOUTH DAILY.   ARMOUR THYROID  60 MG tablet TAKE 1 TABLET BY MOUTH EVERY DAY (NOT COVERED BY INSURANCE)   budesonide -formoterol  (SYMBICORT ) 80-4.5 MCG/ACT inhaler INHALE TWO PUFFS into THE lungs TWICE DAILY   Calcium  Carbonate-Vit D-Min (CALCIUM  1200 PO) Take 1,200 mg by mouth daily.   Cholecalciferol  25 MCG (1000 UT) tablet Take 1,000 Units by mouth daily.   Coenzyme Q10 (CO Q 10 PO) Take 100 mg by mouth daily.    ipratropium-albuterol  (DUONEB) 0.5-2.5 (3) MG/3ML SOLN Take 3 mLs by nebulization every 4 (four) hours as needed (asthma).   levothyroxine  (SYNTHROID ) 50 MCG tablet Take 1 tablet (50 mcg total) by mouth daily.   MAGNESIUM  PO Take 450 mg by mouth at bedtime.    metoprolol  succinate (TOPROL -XL) 100 MG 24 hr tablet TAKE 1 TABLET BY MOUTH EVERY DAY WITH FOOD   montelukast  (SINGULAIR ) 10 MG tablet TAKE 1 TABLET BY MOUTH EVERY DAY   Multiple Vitamin (MULTIVITAMIN WITH MINERALS) TABS tablet Take 1 tablet by mouth daily.   Omega-3 Fatty Acids (FISH OIL PO) Take 1,300 mg by mouth daily.    omeprazole  (PRILOSEC) 40 MG capsule TAKE 1 CAPSULE BY MOUTH EVERY DAY   temazepam  (RESTORIL ) 30 MG capsule TAKE 1 CAPSULE (30 MG TOTAL) BY MOUTH AT BEDTIME.   No facility-administered encounter medications on file as of 09/04/2024.    History: Past Medical History:  Diagnosis Date   Allergic rhinitis    Asthma    Chronic hoarseness    sees Dr. Macon (ENT) in [facility]    Essential hypertension    GERD (gastroesophageal reflux disease)    Hypothyroidism    Insomnia    Neck pain, chronic    Past Surgical History:  Procedure Laterality Date   CESAREAN SECTION     x 2   COLONOSCOPY  05/24/2007   per Dr. Aneita, benign polpys, and diverticulosis, repeat 5 years (she declines any further colon cancer screening)   EP IMPLANTABLE DEVICE N/A 06/18/2016   Procedure: Pacemaker Implant;  Surgeon: Will Gladis Norton, MD;  Location: [facility];  Service: Cardiovascular;   Laterality: N/A;   EYE SURGERY     left foot surgery     VESICOVAGINAL FISTULA CLOSURE W/ TAH     WISDOM TOOTH EXTRACTION     Family History  Problem Relation Age of Onset   Stroke Mother    Heart attack Father 33   Coronary artery disease Other    Diabetes Other    Hyperlipidemia Other    Hypertension Other    Stroke Other    Atrial fibrillation Granddaughter    Atrial fibrillation Grandson    Social History   Occupational History   Occupation: retired respiratory therapist  Tobacco Use   Smoking status: Former   Smokeless tobacco: Never   Tobacco comments:    Former smoker 12/23/22  Vaping Use   Vaping status: Never Used  Substance and Sexual Activity   Alcohol use: Yes    Alcohol/week: 2.0 standard drinks of alcohol    Types: 2 Standard drinks or equivalent per week    Comment: glass of red wine   Drug use: No   Sexual activity: Not on file   Tobacco Counseling Counseling given: No Tobacco comments: Former smoker 12/23/22  SDOH Screenings   Food Insecurity: No Food Insecurity (09/04/2024)  Housing: Low Risk (09/04/2024)  Transportation Needs: No Transportation Needs (09/04/2024)  Utilities: Not At Risk (09/04/2024)  Alcohol Screen: Low Risk (08/29/2024)  Depression (PHQ2-9): Low Risk (09/04/2024)  Financial Resource Strain: Low Risk (08/29/2024)  Physical Activity: Insufficiently Active (09/04/2024)  Social Connections: Socially Isolated (09/04/2024)  Stress: No Stress Concern Present (09/04/2024)  Tobacco Use: Medium Risk (09/04/2024)  Health Literacy: Adequate Health Literacy (09/04/2024)   See flowsheets for full screening details  Depression Screen PHQ 2 & 9 Depression Scale- Over the past 2 weeks, how often have you been bothered by any of the following problems? Little interest or pleasure in doing things: 0 Feeling down, depressed, or hopeless (PHQ Adolescent also includes...irritable): 0 PHQ-2 Total Score: 0     Goals Addressed                This Visit's Progress     Increase physical activity (pt-stated)        Remain active.             Objective:    Today's Vitals   09/04/24 1024  Weight: 185 lb (83.9 kg)  Height: 5' 5 (1.651 m)   Body mass index is 30.79 kg/m.  Hearing/Vision screen Hearing Screening - Comments:: Denies hearing difficulties   Vision Screening - Comments:: Wears rx glasses - up to date with routine eye exams with  [facility] Immunizations and Health Maintenance Health Maintenance  Topic Date Due   COVID-19 Vaccine (4 - 2025-26 season) 05/08/2024   Medicare Annual Wellness (AWV)  09/04/2025   DTaP/Tdap/Td (2 - Td or Tdap) 09/22/2025   Influenza Vaccine  Completed   Bone Density Scan  Completed   Hepatitis C Screening  Completed   Zoster Vaccines- Shingrix  Completed   Meningococcal B Vaccine  Aged Out   Pneumococcal Vaccine: 50+ Years  Discontinued   Mammogram  Discontinued   Colonoscopy  Discontinued        Assessment/Plan:  This is a routine wellness examination for [place].  Patient Care Team: Johnny Garnette LABOR, MD as PCP - General (Family Medicine) Inocencio Soyla Lunger, MD as PCP - Electrophysiology (Cardiology) Liane Sharyne MATSU, [facility] (Inactive) as Pharmacist (Pharmacist)  I have personally reviewed and noted the following in the patients chart:   Medical and social history Use of alcohol, tobacco or illicit drugs  Current medications and supplements including opioid prescriptions. Functional ability and status Nutritional status Physical activity Advanced directives List of other physicians Hospitalizations, surgeries, and ER visits in previous 12 months Vitals Screenings to include cognitive, depression, and falls Referrals and appointments  No orders of the defined types were placed in this encounter.  In addition, I have reviewed and discussed with patient certain preventive protocols, quality metrics, and best practice recommendations. A written  personalized care plan for preventive services as well as general preventive health recommendations were provided to patient.   Rojelio LELON Blush, LPN   [id number]   Return in 53 weeks (on 09/10/2025).  After Visit Summary: (MyChart) Due to this being a telephonic visit, the after visit summary with patients personalized plan was offered to patient via MyChart   Nurse Notes: No voiced or noted concerns at this time "

## 2024-09-04 NOTE — Patient Instructions (Addendum)
 Whitney Evans,  Thank you for taking the time for your Medicare Wellness Visit. I appreciate your continued commitment to your health goals. Please review the care plan we discussed, and feel free to reach out if I can assist you further.  Please note that Annual Wellness Visits do not include a physical exam. Some assessments may be limited, especially if the visit was conducted virtually. If needed, we may recommend an in-person follow-up with your provider.  Ongoing Care Seeing your primary care provider every 3 to 6 months helps us  monitor your health and provide consistent, personalized care.    Referrals If a referral was made during today's visit and you haven't received any updates within two weeks, please contact the referred provider directly to check on the status.  Recommended Screenings:  Health Maintenance  Topic Date Due   COVID-19 Vaccine (4 - 2025-26 season) 05/08/2024   Medicare Annual Wellness Visit  09/04/2025   DTaP/Tdap/Td vaccine (2 - Td or Tdap) 09/22/2025   Flu Shot  Completed   Osteoporosis screening with Bone Density Scan  Completed   Hepatitis C Screening  Completed   Zoster (Shingles) Vaccine  Completed   Meningitis B Vaccine  Aged Out   Pneumococcal Vaccine for age over 24  Discontinued   Breast Cancer Screening  Discontinued   Colon Cancer Screening  Discontinued       09/04/2024   10:27 AM  Advanced Directives  Does Patient Have a Medical Advance Directive? Yes  Type of Estate Agent of [place];Living will  Does patient want to make changes to medical advance directive? No - Patient declined  Copy of Healthcare Power of Attorney in Chart? No - copy requested    Vision: Annual vision screenings are recommended for early detection of glaucoma, cataracts, and diabetic retinopathy. These exams can also reveal signs of chronic conditions such as diabetes and high blood pressure.  Dental: Annual dental screenings help detect  early signs of oral cancer, gum disease, and other conditions linked to overall health, including heart disease and diabetes.  Please see the attached documents for additional preventive care recommendations.

## 2024-09-13 ENCOUNTER — Ambulatory Visit

## 2024-09-13 DIAGNOSIS — I442 Atrioventricular block, complete: Secondary | ICD-10-CM

## 2024-09-14 ENCOUNTER — Ambulatory Visit: Payer: Self-pay | Admitting: Cardiology

## 2024-09-14 LAB — CUP PACEART REMOTE DEVICE CHECK
Battery Remaining Longevity: 24 mo
Battery Remaining Percentage: 22 %
Battery Voltage: 2.9 V
Brady Statistic AP VP Percent: 27 %
Brady Statistic AP VS Percent: 1 %
Brady Statistic AS VP Percent: 72 %
Brady Statistic AS VS Percent: 1 %
Brady Statistic RA Percent Paced: 27 %
Brady Statistic RV Percent Paced: 99 %
Date Time Interrogation Session: 20260107023124
Implantable Lead Connection Status: 753985
Implantable Lead Connection Status: 753985
Implantable Lead Implant Date: 20171012
Implantable Lead Implant Date: 20171012
Implantable Lead Location: 753859
Implantable Lead Location: 753860
Implantable Pulse Generator Implant Date: 20171012
Lead Channel Impedance Value: 430 Ohm
Lead Channel Impedance Value: 650 Ohm
Lead Channel Pacing Threshold Amplitude: 0.5 V
Lead Channel Pacing Threshold Amplitude: 0.5 V
Lead Channel Pacing Threshold Pulse Width: 0.5 ms
Lead Channel Pacing Threshold Pulse Width: 0.5 ms
Lead Channel Sensing Intrinsic Amplitude: 2.5 mV
Lead Channel Sensing Intrinsic Amplitude: 8.2 mV
Lead Channel Setting Pacing Amplitude: 2 V
Lead Channel Setting Pacing Amplitude: 2.5 V
Lead Channel Setting Pacing Pulse Width: 0.5 ms
Lead Channel Setting Sensing Sensitivity: 2 mV
Pulse Gen Model: 2272
Pulse Gen Serial Number: 7955201

## 2024-09-18 NOTE — Progress Notes (Signed)
 Remote PPM Transmission

## 2024-12-13 ENCOUNTER — Encounter

## 2025-03-14 ENCOUNTER — Encounter

## 2025-05-15 ENCOUNTER — Encounter: Admitting: Family Medicine

## 2025-06-13 ENCOUNTER — Encounter

## 2025-09-10 ENCOUNTER — Ambulatory Visit

## 2025-09-12 ENCOUNTER — Encounter

## 2025-12-12 ENCOUNTER — Encounter

## 2026-03-13 ENCOUNTER — Encounter
